# Patient Record
Sex: Male | Born: 1935 | Race: White | Hispanic: No | Smoking: Never smoker
Health system: Southern US, Community
[De-identification: ages and names within clinical notes are randomized; demographics above are authoritative.]

## PROBLEM LIST (undated history)

## (undated) DIAGNOSIS — I749 Embolism and thrombosis of unspecified artery: Secondary | ICD-10-CM

## (undated) DIAGNOSIS — E119 Type 2 diabetes mellitus without complications: Secondary | ICD-10-CM

## (undated) DIAGNOSIS — N289 Disorder of kidney and ureter, unspecified: Secondary | ICD-10-CM

## (undated) DIAGNOSIS — G4730 Sleep apnea, unspecified: Secondary | ICD-10-CM

## (undated) DIAGNOSIS — I509 Heart failure, unspecified: Secondary | ICD-10-CM

## (undated) DIAGNOSIS — M4800 Spinal stenosis, site unspecified: Secondary | ICD-10-CM

## (undated) DIAGNOSIS — I10 Essential (primary) hypertension: Secondary | ICD-10-CM

## (undated) DIAGNOSIS — I639 Cerebral infarction, unspecified: Secondary | ICD-10-CM

## (undated) DIAGNOSIS — I82409 Acute embolism and thrombosis of unspecified deep veins of unspecified lower extremity: Secondary | ICD-10-CM

## (undated) DIAGNOSIS — I2510 Atherosclerotic heart disease of native coronary artery without angina pectoris: Secondary | ICD-10-CM

## (undated) DIAGNOSIS — M21379 Foot drop, unspecified foot: Secondary | ICD-10-CM

## (undated) HISTORY — PX: TOTAL HIP ARTHROPLASTY: SHX124

---

## 2004-08-12 ENCOUNTER — Ambulatory Visit: Payer: Self-pay

## 2006-07-12 IMAGING — CT CT NECK WITH CONTRAST
1 of 2 series · 10 of 14 positions shown, 13 images · non-contrast
Comparison: none

REASON FOR EXAM: Rt Neck Mass Pt Diabetic Metformin
COMMENTS:

[Series 2: neck 3.0 b40f · axial · 0.49mm/px · z∈[-225,+36]mm · 10 of 107 slices shown, 13 images]
[im 10/107  soft-tissue]
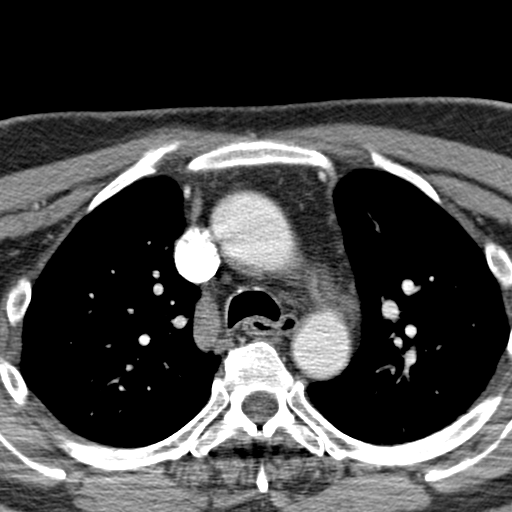
[im 10/107  bone]
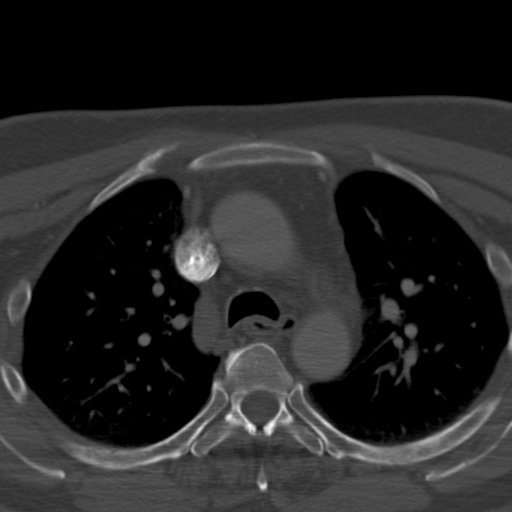
[im 20/107  bone]
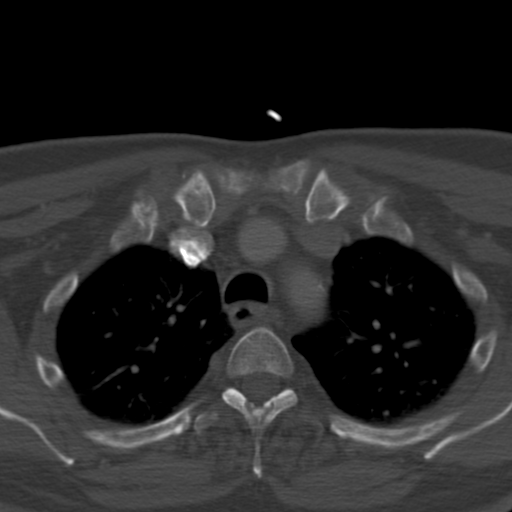
[im 29/107  bone]
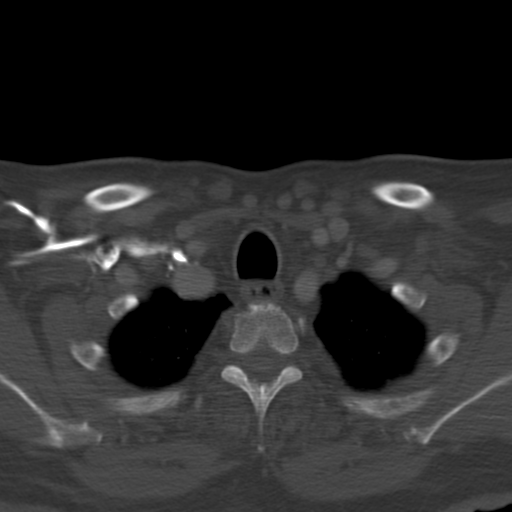
[im 39/107  bone]
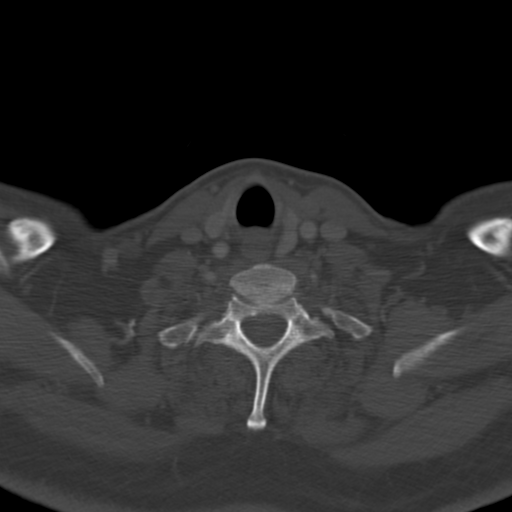
[im 49/107  soft-tissue]
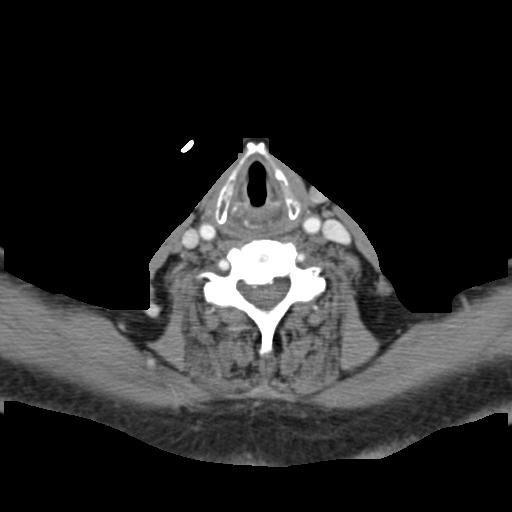
[im 49/107  bone]
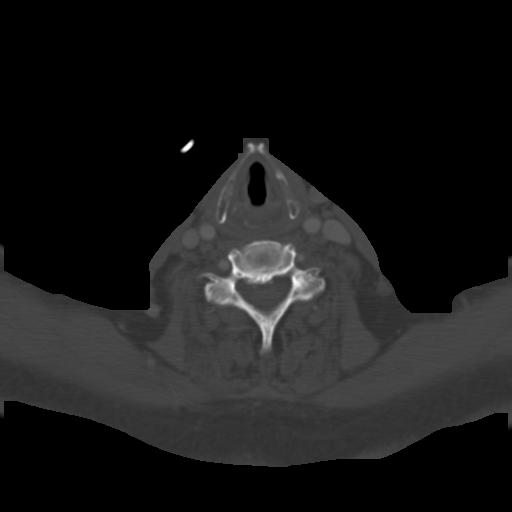
[im 58/107  bone]
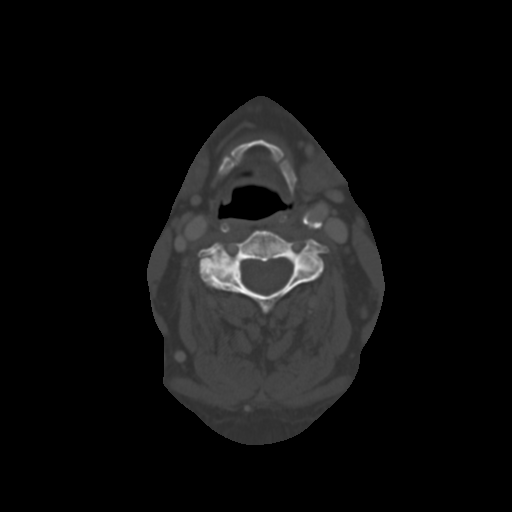
[im 68/107  bone]
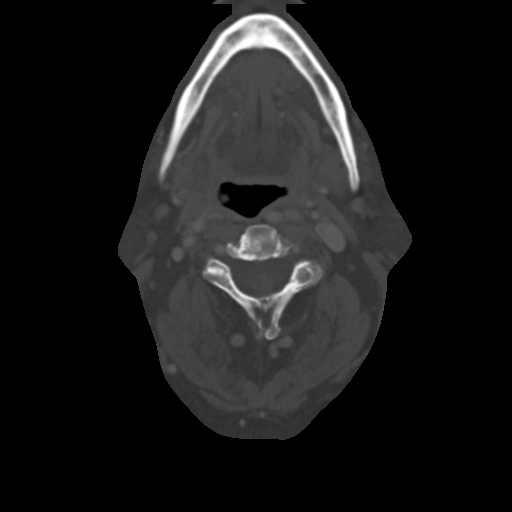
[im 78/107  bone]
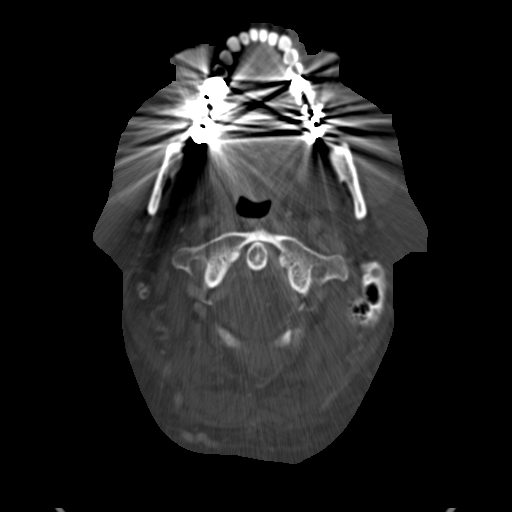
[im 87/107  soft-tissue]
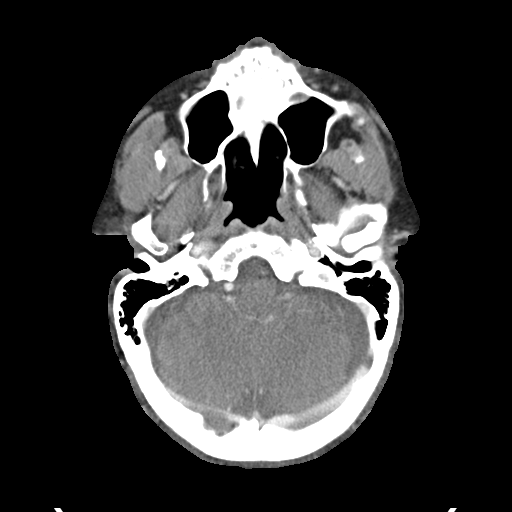
[im 87/107  bone]
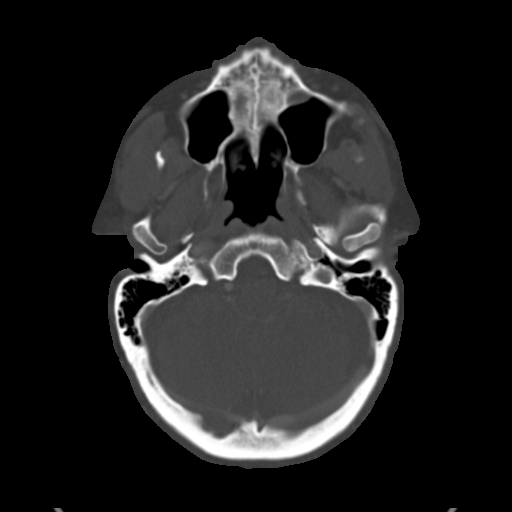
[im 97/107  bone]
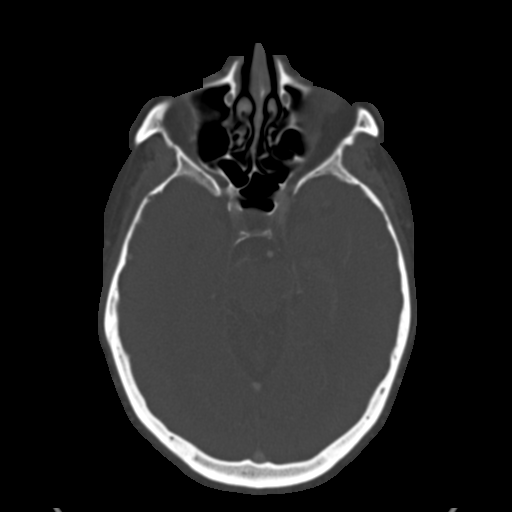

[10 of 14 positions shown; findings below may reference images not displayed]

PROCEDURE:     CT  - CT NECK WITH CONTRAST  - August 12, 2004 [DATE]

RESULT:         Axial images were obtained from the base of the skull to the
breast inlet.

No masses are identified.  The parotid glands as well as the submandibular
glands appear intact.  No enlarged lymph nodes are seen.  The LEFT carotid
is incidentally noted to be tortuous and extends just posterior to the
oropharynx on the LEFT.
IMPRESSION: No mass is identified in the RIGHT neck.

## 2009-04-08 ENCOUNTER — Inpatient Hospital Stay: Payer: Self-pay | Admitting: Specialist

## 2011-03-09 IMAGING — US US EXTREM LOW VENOUS BILAT
1 series · 17 of 24 positions shown · non-contrast
Comparison: none

REASON FOR EXAM: swelling/edema, ?DVT
COMMENTS:

[Series 1: us extrem low venous bilat · 17 of 53 slices shown]
[im 1/53]
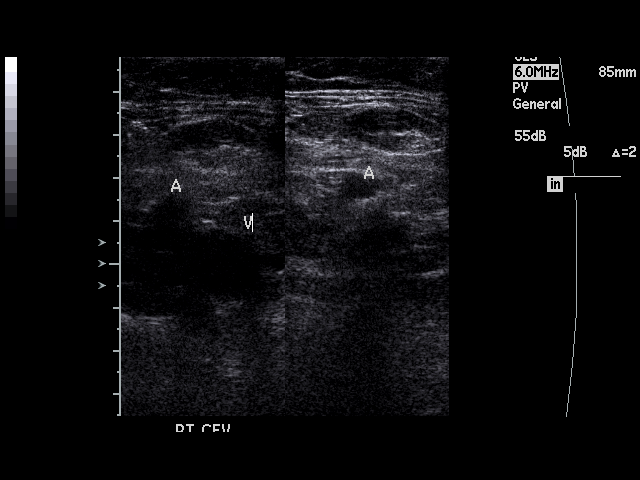
[im 5/53]
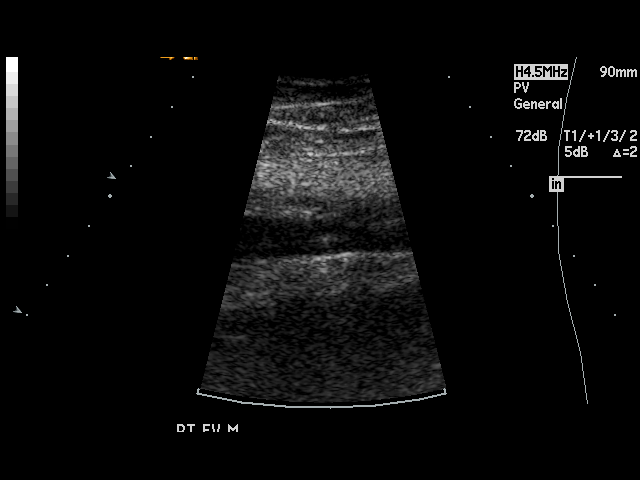
[im 7/53]
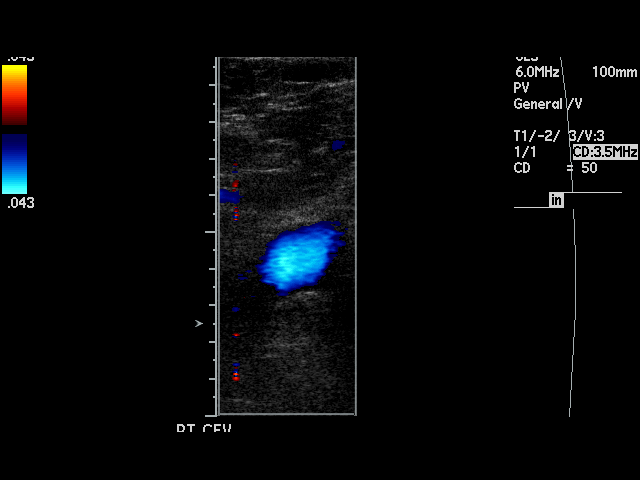
[im 10/53]
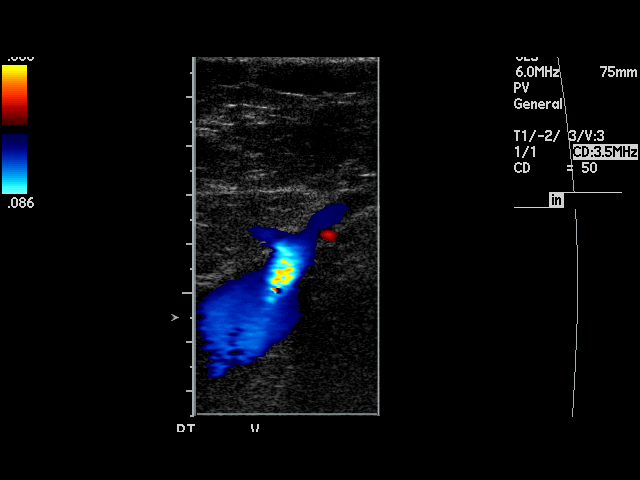
[im 14/53]
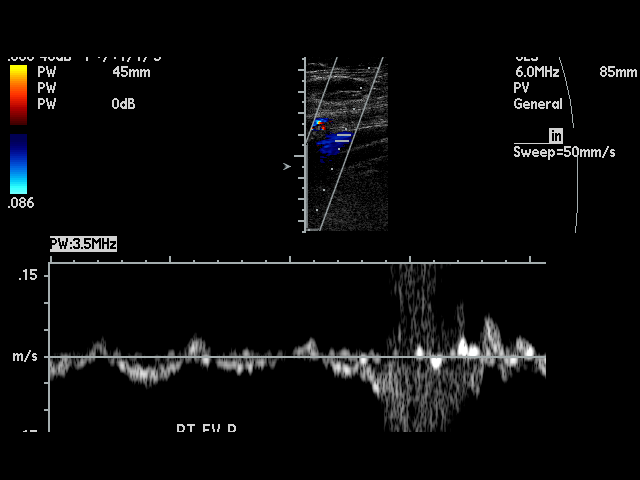
[im 16/53]
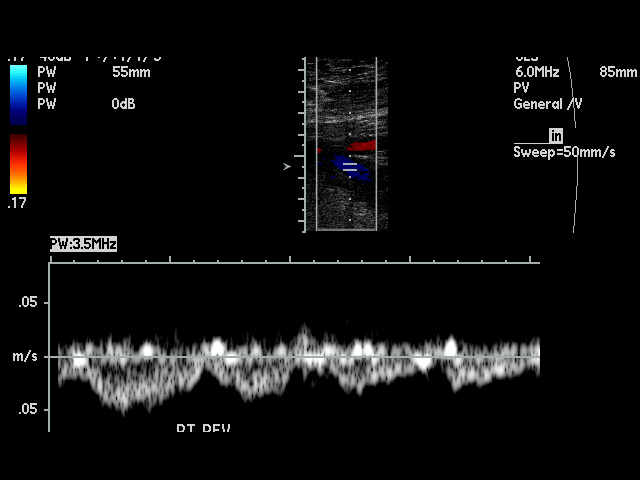
[im 21/53]
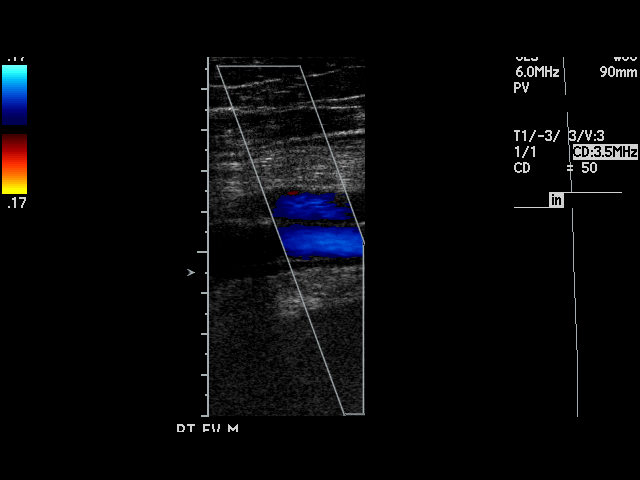
[im 23/53]
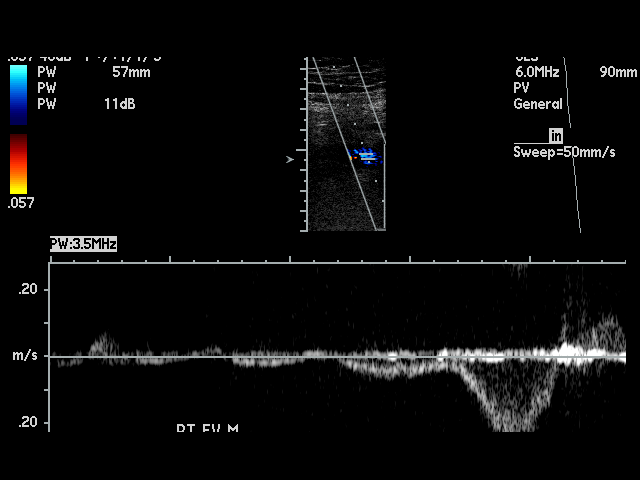
[im 28/53]
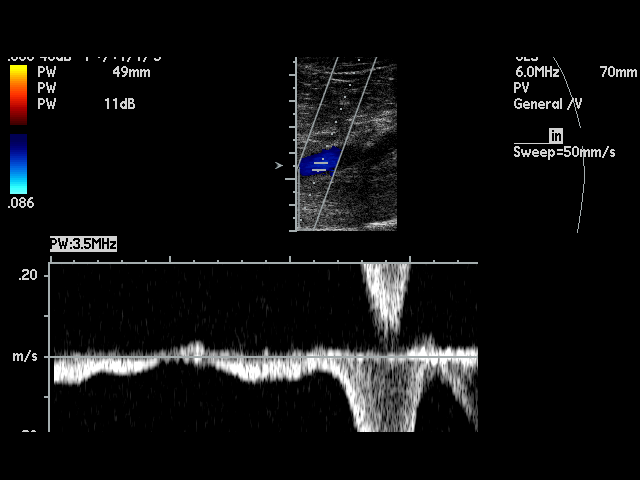
[im 30/53]
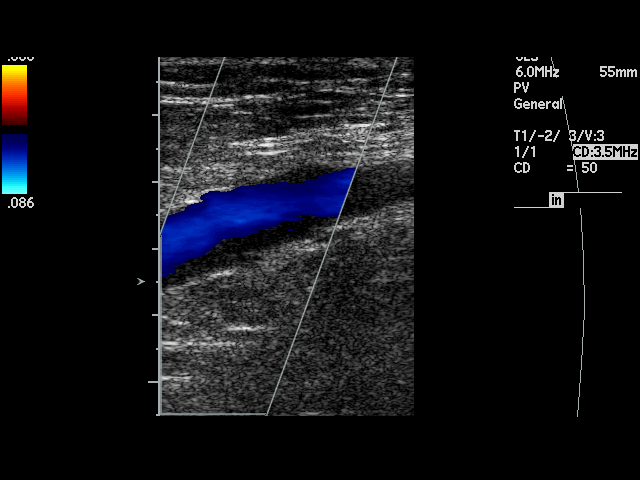
[im 32/53]
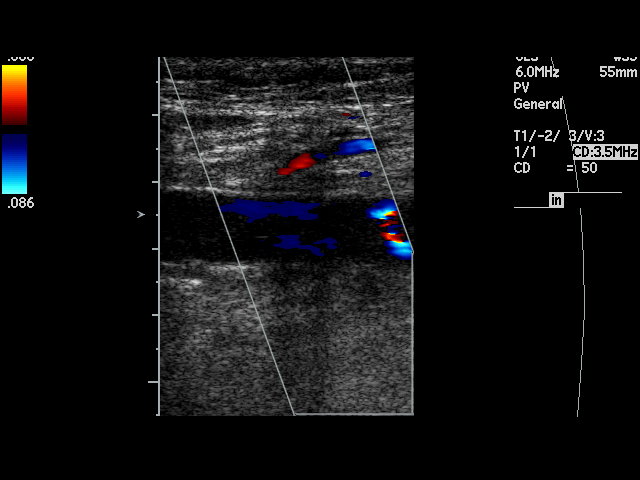
[im 37/53]
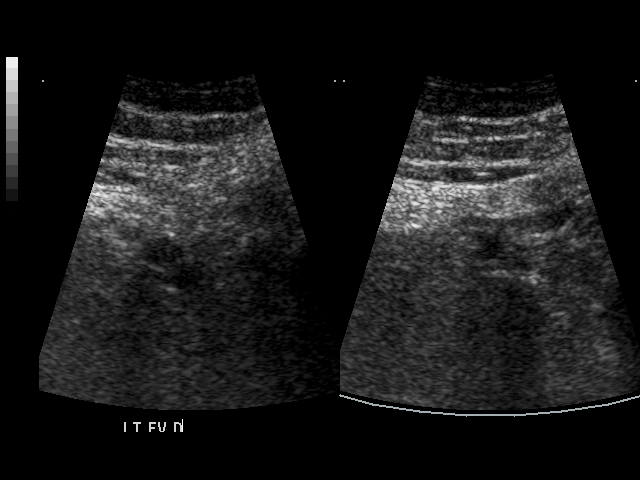
[im 39/53]
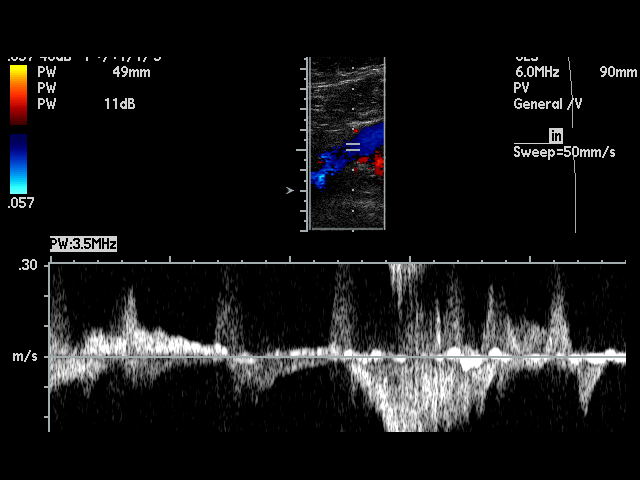
[im 43/53]
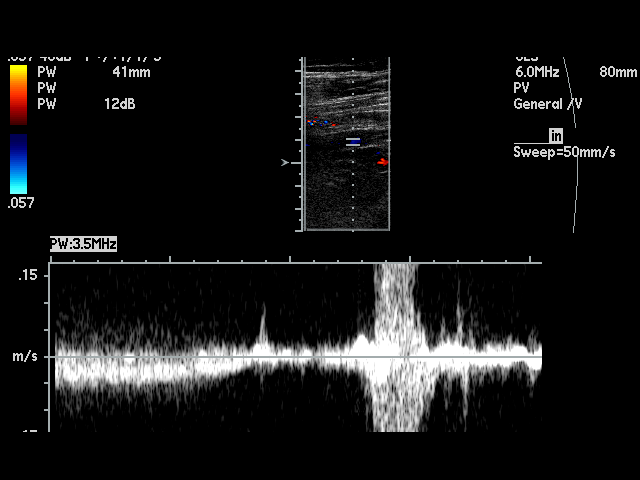
[im 46/53]
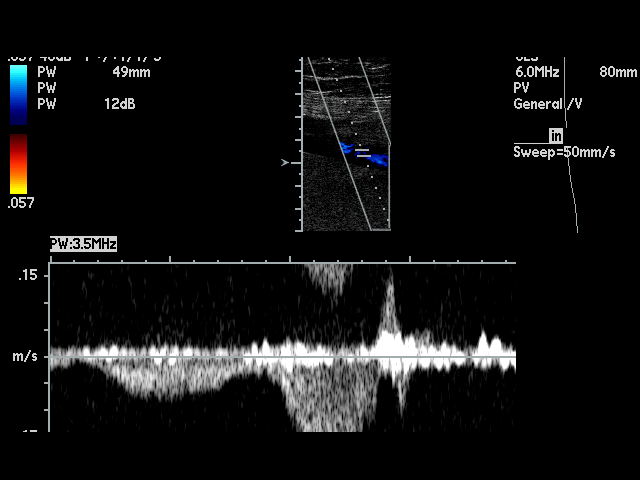
[im 48/53]
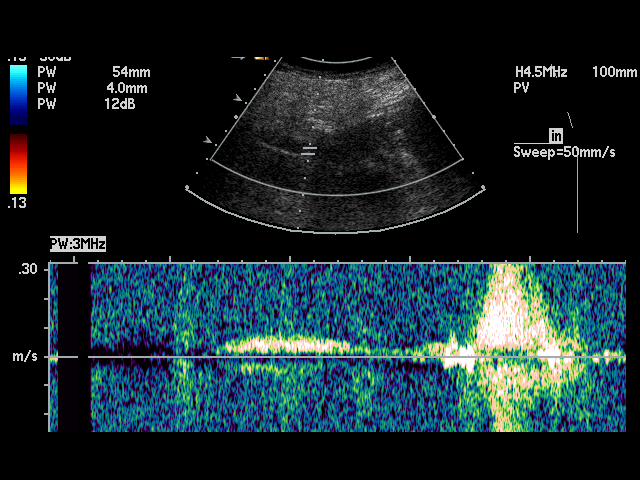
[im 53/53]
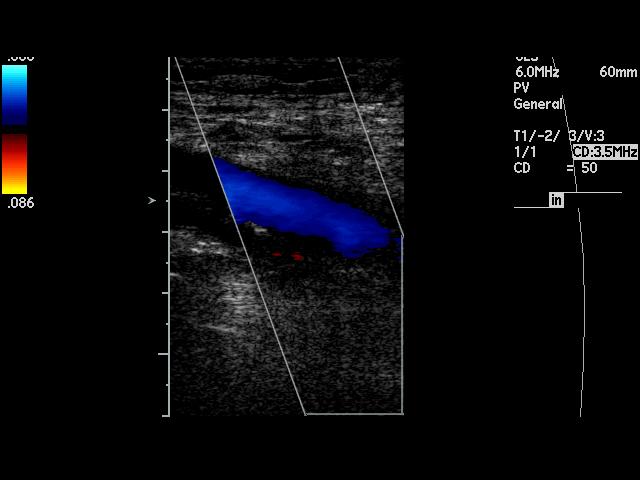

[17 of 24 positions shown; findings below may reference images not displayed]

PROCEDURE:     US  - US DOPPLER LOW EXTR BILATERAL  - April 09, 2009  [DATE]

RESULT:     On the right, the common femoral vein shows complete
compressibility. In the superficial femoral and popliteal, there is noted
loss of complete compressibility. The findings are compatible with deep vein
thrombosis of the right superficial femoral and popliteal. Doppler
examination shows the thrombus to be non-occlusive.

On the left, there is complete compressibility of the femoral and popliteal
vein throughout its course. Doppler examination shows no occlusion or
evidence for deep vein thrombosis on the left.
IMPRESSION: 1.  There is observed non-occlusive deep vein thrombosis involving the
superficial femoral and popliteal on the right.
2.  No deep vein thrombosis is identified on the left.

## 2022-01-20 ENCOUNTER — Inpatient Hospital Stay: Payer: BC Managed Care – PPO

## 2022-01-20 ENCOUNTER — Inpatient Hospital Stay
Admission: EM | Admit: 2022-01-20 | Discharge: 2022-01-21 | DRG: 088 | Disposition: A | Discharge status: Home / Self Care | Payer: BC Managed Care – PPO | Attending: Hospitalist | Admitting: Hospitalist

## 2022-01-20 ENCOUNTER — Other Ambulatory Visit: Payer: Self-pay

## 2022-01-20 ENCOUNTER — Emergency Department: Payer: BC Managed Care – PPO

## 2022-01-20 DIAGNOSIS — Y92009 Unspecified place in unspecified non-institutional (private) residence as the place of occurrence of the external cause: Secondary | ICD-10-CM

## 2022-01-20 DIAGNOSIS — E875 Hyperkalemia: Secondary | ICD-10-CM | POA: Diagnosis present

## 2022-01-20 DIAGNOSIS — E669 Obesity, unspecified: Secondary | ICD-10-CM | POA: Diagnosis present

## 2022-01-20 DIAGNOSIS — Z6837 Body mass index (BMI) 37.0-37.9, adult: Secondary | ICD-10-CM

## 2022-01-20 DIAGNOSIS — W010XXA Fall on same level from slipping, tripping and stumbling without subsequent striking against object, initial encounter: Secondary | ICD-10-CM | POA: Diagnosis present

## 2022-01-20 DIAGNOSIS — E039 Hypothyroidism, unspecified: Secondary | ICD-10-CM | POA: Diagnosis present

## 2022-01-20 DIAGNOSIS — Y92003 Bedroom of unspecified non-institutional (private) residence as the place of occurrence of the external cause: Secondary | ICD-10-CM

## 2022-01-20 DIAGNOSIS — R4182 Altered mental status, unspecified: Secondary | ICD-10-CM | POA: Diagnosis present

## 2022-01-20 DIAGNOSIS — Z7984 Long term (current) use of oral hypoglycemic drugs: Secondary | ICD-10-CM

## 2022-01-20 DIAGNOSIS — S065XAA Traumatic subdural hemorrhage with loss of consciousness status unknown, initial encounter: Principal | ICD-10-CM | POA: Diagnosis present

## 2022-01-20 DIAGNOSIS — Z79890 Hormone replacement therapy (postmenopausal): Secondary | ICD-10-CM | POA: Diagnosis not present

## 2022-01-20 DIAGNOSIS — W19XXXA Unspecified fall, initial encounter: Secondary | ICD-10-CM

## 2022-01-20 DIAGNOSIS — N1832 Chronic kidney disease, stage 3b: Secondary | ICD-10-CM | POA: Diagnosis present

## 2022-01-20 DIAGNOSIS — I129 Hypertensive chronic kidney disease with stage 1 through stage 4 chronic kidney disease, or unspecified chronic kidney disease: Secondary | ICD-10-CM | POA: Diagnosis present

## 2022-01-20 DIAGNOSIS — Z7901 Long term (current) use of anticoagulants: Secondary | ICD-10-CM

## 2022-01-20 DIAGNOSIS — Z79899 Other long term (current) drug therapy: Secondary | ICD-10-CM | POA: Diagnosis not present

## 2022-01-20 DIAGNOSIS — S0990XA Unspecified injury of head, initial encounter: Secondary | ICD-10-CM

## 2022-01-20 DIAGNOSIS — E785 Hyperlipidemia, unspecified: Secondary | ICD-10-CM | POA: Diagnosis present

## 2022-01-20 DIAGNOSIS — S060X0A Concussion without loss of consciousness, initial encounter: Principal | ICD-10-CM | POA: Diagnosis present

## 2022-01-20 DIAGNOSIS — E1129 Type 2 diabetes mellitus with other diabetic kidney complication: Secondary | ICD-10-CM | POA: Diagnosis present

## 2022-01-20 DIAGNOSIS — I4891 Unspecified atrial fibrillation: Secondary | ICD-10-CM | POA: Diagnosis present

## 2022-01-20 DIAGNOSIS — G9341 Metabolic encephalopathy: Secondary | ICD-10-CM | POA: Diagnosis present

## 2022-01-20 DIAGNOSIS — E1122 Type 2 diabetes mellitus with diabetic chronic kidney disease: Secondary | ICD-10-CM | POA: Diagnosis present

## 2022-01-20 DIAGNOSIS — S0101XA Laceration without foreign body of scalp, initial encounter: Secondary | ICD-10-CM | POA: Diagnosis present

## 2022-01-20 DIAGNOSIS — I10 Essential (primary) hypertension: Secondary | ICD-10-CM | POA: Diagnosis present

## 2022-01-20 DIAGNOSIS — Z8052 Family history of malignant neoplasm of bladder: Secondary | ICD-10-CM

## 2022-01-20 DIAGNOSIS — I482 Chronic atrial fibrillation, unspecified: Secondary | ICD-10-CM | POA: Diagnosis not present

## 2022-01-20 DIAGNOSIS — Z86718 Personal history of other venous thrombosis and embolism: Secondary | ICD-10-CM | POA: Diagnosis not present

## 2022-01-20 DIAGNOSIS — R410 Disorientation, unspecified: Secondary | ICD-10-CM

## 2022-01-20 DIAGNOSIS — I82409 Acute embolism and thrombosis of unspecified deep veins of unspecified lower extremity: Secondary | ICD-10-CM | POA: Diagnosis present

## 2022-01-20 DIAGNOSIS — E119 Type 2 diabetes mellitus without complications: Secondary | ICD-10-CM

## 2022-01-20 DIAGNOSIS — S065X0A Traumatic subdural hemorrhage without loss of consciousness, initial encounter: Secondary | ICD-10-CM | POA: Diagnosis not present

## 2022-01-20 HISTORY — DX: Type 2 diabetes mellitus without complications: E11.9

## 2022-01-20 HISTORY — DX: Essential (primary) hypertension: I10

## 2022-01-20 HISTORY — DX: Embolism and thrombosis of unspecified artery: I74.9

## 2022-01-20 HISTORY — DX: Acute embolism and thrombosis of unspecified deep veins of unspecified lower extremity: I82.409

## 2022-01-20 LAB — COMPREHENSIVE METABOLIC PANEL
ALT: 15 U/L (ref 0–44)
AST: 18 U/L (ref 15–41)
Albumin: 3.7 g/dL (ref 3.5–5.0)
Alkaline Phosphatase: 36 U/L — ABNORMAL LOW (ref 38–126)
Anion gap: 7 (ref 5–15)
BUN: 43 mg/dL — ABNORMAL HIGH (ref 8–23)
CO2: 24 mmol/L (ref 22–32)
Calcium: 9.8 mg/dL (ref 8.9–10.3)
Chloride: 108 mmol/L (ref 98–111)
Creatinine, Ser: 1.63 mg/dL — ABNORMAL HIGH (ref 0.61–1.24)
GFR, Estimated: 41 mL/min — ABNORMAL LOW (ref >60)
Glucose, Bld: 137 mg/dL — ABNORMAL HIGH (ref 70–99)
Potassium: 5.3 mmol/L — ABNORMAL HIGH (ref 3.5–5.1)
Sodium: 139 mmol/L (ref 135–145)
Total Bilirubin: 0.6 mg/dL (ref 0.3–1.2)
Total Protein: 7.1 g/dL (ref 6.5–8.1)

## 2022-01-20 LAB — URINALYSIS, COMPLETE (UACMP) WITH MICROSCOPIC
Bacteria, UA: NONE SEEN
Bilirubin Urine: NEGATIVE
Glucose, UA: NEGATIVE mg/dL
Hgb urine dipstick: NEGATIVE
Ketones, ur: NEGATIVE mg/dL
Leukocytes,Ua: NEGATIVE
Nitrite: NEGATIVE
Protein, ur: NEGATIVE mg/dL
Specific Gravity, Urine: 1.013 (ref 1.005–1.030)
pH: 6 (ref 5.0–8.0)

## 2022-01-20 LAB — CBG MONITORING, ED: Glucose-Capillary: 114 mg/dL — ABNORMAL HIGH (ref 70–99)

## 2022-01-20 LAB — APTT: aPTT: 24 seconds (ref 24–36)

## 2022-01-20 LAB — PROTIME-INR
INR: 1.1 (ref 0.8–1.2)
Prothrombin Time: 14.4 seconds (ref 11.4–15.2)

## 2022-01-20 LAB — CBC WITH DIFFERENTIAL/PLATELET
Abs Immature Granulocytes: 0.02 10*3/uL (ref 0.00–0.07)
Basophils Absolute: 0 10*3/uL (ref 0.0–0.1)
Basophils Relative: 1 %
Eosinophils Absolute: 0.1 10*3/uL (ref 0.0–0.5)
Eosinophils Relative: 3 %
HCT: 42.6 % (ref 39.0–52.0)
Hemoglobin: 13.5 g/dL (ref 13.0–17.0)
Immature Granulocytes: 0 %
Lymphocytes Relative: 12 %
Lymphs Abs: 0.7 10*3/uL (ref 0.7–4.0)
MCH: 29.9 pg (ref 26.0–34.0)
MCHC: 31.7 g/dL (ref 30.0–36.0)
MCV: 94.5 fL (ref 80.0–100.0)
Monocytes Absolute: 0.5 10*3/uL (ref 0.1–1.0)
Monocytes Relative: 10 %
Neutro Abs: 4.2 10*3/uL (ref 1.7–7.7)
Neutrophils Relative %: 74 %
Platelets: 138 10*3/uL — ABNORMAL LOW (ref 150–400)
RBC: 4.51 MIL/uL (ref 4.22–5.81)
RDW: 13.5 % (ref 11.5–15.5)
WBC: 5.6 10*3/uL (ref 4.0–10.5)
nRBC: 0 % (ref 0.0–0.2)

## 2022-01-20 MED ORDER — AMLODIPINE BESYLATE 5 MG PO TABS
5.0000 mg | ORAL_TABLET | Freq: Every day | ORAL | Status: DC
  Administered 2022-01-20 – 2022-01-21 (×2): 5 mg via ORAL
  Filled 2022-01-20 (×2): qty 1

## 2022-01-20 MED ORDER — ROSUVASTATIN CALCIUM 20 MG PO TABS
10.0000 mg | ORAL_TABLET | Freq: Every day | ORAL | Status: DC
  Administered 2022-01-20 – 2022-01-21 (×2): 10 mg via ORAL
  Filled 2022-01-20 (×2): qty 1

## 2022-01-20 MED ORDER — INSULIN ASPART 100 UNIT/ML IJ SOLN: 0.0000 [IU] | Freq: Three times a day (TID) | INTRAMUSCULAR | Status: DC

## 2022-01-20 MED ORDER — HYDRALAZINE HCL 20 MG/ML IJ SOLN: 5.0000 mg | INTRAMUSCULAR | Status: DC | PRN

## 2022-01-20 MED ORDER — SODIUM ZIRCONIUM CYCLOSILICATE 10 G PO PACK
10.0000 g | PACK | Freq: Once | ORAL | Status: DC
  Filled 2022-01-20: qty 1

## 2022-01-20 MED ORDER — ONDANSETRON HCL 4 MG/2ML IJ SOLN: 4.0000 mg | Freq: Three times a day (TID) | INTRAMUSCULAR | Status: DC | PRN

## 2022-01-20 MED ORDER — LEVOTHYROXINE SODIUM 25 MCG PO TABS
25.0000 ug | ORAL_TABLET | Freq: Every morning | ORAL | Status: DC
  Administered 2022-01-21: 25 ug via ORAL
  Filled 2022-01-20: qty 1

## 2022-01-20 MED ORDER — FENOFIBRATE 54 MG PO TABS
54.0000 mg | ORAL_TABLET | Freq: Every day | ORAL | Status: DC
  Administered 2022-01-20 – 2022-01-21 (×2): 54 mg via ORAL
  Filled 2022-01-20 (×3): qty 1

## 2022-01-20 MED ORDER — EZETIMIBE 10 MG PO TABS
10.0000 mg | ORAL_TABLET | Freq: Every day | ORAL | Status: DC
  Administered 2022-01-20 – 2022-01-21 (×2): 10 mg via ORAL
  Filled 2022-01-20 (×2): qty 1

## 2022-01-20 MED ORDER — INSULIN ASPART 100 UNIT/ML IJ SOLN: 0.0000 [IU] | Freq: Every day | INTRAMUSCULAR | Status: DC

## 2022-01-20 MED ORDER — ACETAMINOPHEN 325 MG PO TABS: 650.0000 mg | ORAL_TABLET | Freq: Four times a day (QID) | ORAL | Status: DC | PRN

## 2022-01-20 MED ORDER — CARVEDILOL 6.25 MG PO TABS
12.5000 mg | ORAL_TABLET | Freq: Two times a day (BID) | ORAL | Status: DC
  Administered 2022-01-20 – 2022-01-21 (×3): 12.5 mg via ORAL
  Filled 2022-01-20 (×3): qty 2

## 2022-01-20 MED ORDER — SODIUM CHLORIDE 0.9 % IV BOLUS
1000.0000 mL | Freq: Once | INTRAVENOUS | Status: AC
  Administered 2022-01-20: 1000 mL via INTRAVENOUS

## 2022-01-20 MED ORDER — OMEGA-3-ACID ETHYL ESTERS 1 G PO CAPS
2.0000 | ORAL_CAPSULE | Freq: Two times a day (BID) | ORAL | Status: DC
  Administered 2022-01-20 – 2022-01-21 (×3): 2 g via ORAL
  Filled 2022-01-20 (×3): qty 2

## 2022-01-20 NOTE — H&P (Addendum)
History and Physical    Troy Mueller WUJ:[id number] DOB: 12/21/1932 DOA: 01/20/2022  Referring MD/NP/PA:   PCP: Kandyce RudBabaoff, Marcus, MD   Patient coming from:  The patient is coming from home.     Chief Complaint: fall and head injury, AMS  HPI: Troy Mueller is a 88 y.o. male with medical history significant of hypertension, hyperlipidemia, diabetes, hypothyroidism, DVT and atrial fibrillation on Eliquis, CKD-3B, who presents with fall, head injury and altered mental status.  Per his caregiver at bedside, pt slipped on area rugs that are next to his bed and had fall, hitting his head. No LOC. Pt is normally alert, but he became disoriented after fall.  I saw patient in ED, patient is mildly confused, but still oriented x3.  He moves all extremities.  No facial droop or slurred speech.  He had a laceration in the occipital area, which was stitched up and bleeding is controled. He denies chest pain, cough, shortness breath.  No nausea, vomiting, diarrhea or abdominal pain.  No symptoms of UTI.  Data reviewed independently and ED Course: pt was found to have WBC 5.6, stable renal function, potassium 5.3, temperature normal, blood pressure 186/65, heart rate 57, RR 20, oxygen saturation 98% on room air. Pt is admitted to SDU as inpt. Dr. Marcell BarlowYarborough of neurosurgery is consulted.  CT-head: 1. Suspected low-density subdural collections bilaterally, difficult to measure but estimated to be approximately 1 cm bilaterally. A small focus of hyperdensity along the high left parietal convexity (series 2, image 25; series 5, image 35) is concerning for acute/recent extra-axial hemorrhage. No significant midline shift. 2. No evidence of acute fracture or traumatic malalignment in the cervical spine. 3. Multilevel facet uncovertebral hypertrophy with varying degrees of neural foraminal stenosis, probably at least moderate on the right at C5-C6. MRI could further characterize if  clinically warranted.   EKG: I have personally reviewed.  Atrial fibrillation, QTc 413, LAD, poor R wave progression   Review of Systems:   General: no fevers, chills, no body weight gain, has fatigue HEENT: no blurry vision, hearing changes or sore throat Respiratory: no dyspnea, coughing, wheezing CV: no chest pain, no palpitations GI: no nausea, vomiting, abdominal pain, diarrhea, constipation GU: no dysuria, burning on urination, increased urinary frequency, hematuria  Ext: no leg edema Neuro: no unilateral weakness, numbness, or tingling, no vision change or hearing loss. Has fall and AMS Skin: no rash, has scalp laceration MSK: No muscle spasm, no deformity, no limitation of range of movement in spin Heme: No easy bruising.  Travel history: No recent long distant travel.   Allergy:  Allergies  Allergen Reactions   Tylenol [Acetaminophen]     rash    Past Medical History:  Diagnosis Date   Diabetes mellitus without complication (HCC)    DVT (deep venous thrombosis) (HCC)    Embolus (HCC)    HTN (hypertension)     Past Surgical History:  Procedure Laterality Date   TOTAL HIP ARTHROPLASTY      Social History:  reports that he has never smoked. He has never used smokeless tobacco. He reports that he does not currently use alcohol. He reports that he does not use drugs.  Family History:  Family History  Problem Relation Age of Onset   Bladder Cancer Brother      Prior to Admission medications   Medication Sig Start Date End Date Taking? Authorizing Provider  carvedilol (COREG) 12.5 MG tablet Take 12.5 mg by mouth 2 (two)  times daily. 10/15/21  Yes [provider]  JUBLIA 10 % SOLN Apply topically daily. 08/11/21  Yes [provider]  omega-3 acid ethyl esters (LOVAZA) 1 g capsule Take 2 capsules by mouth 2 (two) times daily. 12/30/21  Yes [provider]  PREVIDENT 5000 PLUS 1.1 % CREA dental cream Place 1 Application onto teeth at  bedtime. 01/12/22  Yes [provider]  ramipril (ALTACE) 5 MG capsule Take by mouth. 10/15/21  Yes [provider]  rosuvastatin (CRESTOR) 10 MG tablet Take 1 tablet by mouth daily. 12/09/21  Yes [provider]  amLODipine (NORVASC) 5 MG tablet Take 5 mg by mouth daily.    [provider]  ELIQUIS 5 MG TABS tablet Take 5 mg by mouth 2 (two) times daily.    [provider]  ezetimibe (ZETIA) 10 MG tablet Take 10 mg by mouth daily.    [provider]  fenofibrate 54 MG tablet Take 54 mg by mouth daily.    [provider]  glipiZIDE (GLUCOTROL XL) 5 MG 24 hr tablet Take 5 mg by mouth daily.    [provider]  levothyroxine (SYNTHROID) 25 MCG tablet Take 25 mcg by mouth every morning.    [provider]  metFORMIN (GLUCOPHAGE-XR) 500 MG 24 hr tablet SMARTSIG:2 Tablet(s) By Mouth Every Evening    [provider]    Physical Exam: Vitals:   01/20/22 1130 01/20/22 1215 01/20/22 1539 01/20/22 1548  BP: (!) 143/69 (!) 153/76 137/80   Pulse: 67 (!) 58 62   Resp: 17 20    Temp:      TempSrc:      SpO2: 98% 97%    Weight:    122.5 kg  Height:    5\' 11"  (1.803 m)   General: Not in acute distress HEENT:       Eyes: PERRL, EOMI, no scleral icterus.       ENT: No discharge from the ears and nose, no pharynx injection, no tonsillar enlargement.        Neck: No JVD, no bruit, no mass felt. Heme: No neck lymph node enlargement. Cardiac: S1/S2, RRR, No murmurs, No gallops or rubs. Respiratory: No rales, wheezing, rhonchi or rubs. GI: Soft, nondistended, nontender, no rebound pain, no organomegaly, BS present. GU: No hematuria Ext: No pitting leg edema bilaterally. 1+DP/PT pulse bilaterally. Musculoskeletal: No joint deformities, No joint redness or warmth, no limitation of ROM in spin. Skin: No rashes.  Has scalp laceration to occipital area Neuro: Mildly confused, still oriented X3, cranial nerves II-XII  grossly intact, moves all extremities normally.  Psych: Patient is not psychotic, no suicidal or hemocidal ideation.  Labs on Admission: I have personally reviewed following labs and imaging studies  CBC: Recent Labs  Lab 01/20/22 1010  WBC 5.6  NEUTROABS 4.2  HGB 13.5  HCT 42.6  MCV 94.5  PLT 138*   Basic Metabolic Panel: Recent Labs  Lab 01/20/22 1010  NA 139  K 5.3*  CL 108  CO2 24  GLUCOSE 137*  BUN 43*  CREATININE 1.63*  CALCIUM 9.8   GFR: Estimated Creatinine Clearance: 42.5 mL/min (A) (by C-G formula based on SCr of 1.63 mg/dL (H)). Liver Function Tests: Recent Labs  Lab 01/20/22 1010  AST 18  ALT 15  ALKPHOS 36*  BILITOT 0.6  PROT 7.1  ALBUMIN 3.7   No results for input(s): "LIPASE", "AMYLASE" in the last 168 hours. No results for input(s): "AMMONIA" in the last  168 hours. Coagulation Profile: Recent Labs  Lab 01/20/22 1010  INR 1.1   Cardiac Enzymes: No results for input(s): "CKTOTAL", "CKMB", "CKMBINDEX", "TROPONINI" in the last 168 hours. BNP (last 3 results) No results for input(s): "PROBNP" in the last 8760 hours. HbA1C: No results for input(s): "HGBA1C" in the last 72 hours. CBG: No results for input(s): "GLUCAP" in the last 168 hours. Lipid Profile: No results for input(s): "CHOL", "HDL", "LDLCALC", "TRIG", "CHOLHDL", "LDLDIRECT" in the last 72 hours. Thyroid Function Tests: No results for input(s): "TSH", "T4TOTAL", "FREET4", "T3FREE", "THYROIDAB" in the last 72 hours. Anemia Panel: No results for input(s): "VITAMINB12", "FOLATE", "FERRITIN", "TIBC", "IRON", "RETICCTPCT" in the last 72 hours. Urine analysis: No results found for: "COLORURINE", "APPEARANCEUR", "LABSPEC", "PHURINE", "GLUCOSEU", "HGBUR", "BILIRUBINUR", "KETONESUR", "PROTEINUR", "UROBILINOGEN", "NITRITE", "LEUKOCYTESUR" Sepsis Labs: @LABRCNTIP (procalcitonin:4,lacticidven:4) )No results found for this or any previous visit (from the past 240 hour(s)).   Radiological  Exams on Admission: CT Cervical Spine Wo Contrast  Result Date: 01/20/2022 CLINICAL DATA:  Head trauma, minor (Age >= 65y); Neck trauma (Age >= 65y) EXAM: CT HEAD WITHOUT CONTRAST CT CERVICAL SPINE WITHOUT CONTRAST TECHNIQUE: Multidetector CT imaging of the head and cervical spine was performed following the standard protocol without intravenous contrast. Multiplanar CT image reconstructions of the cervical spine were also generated. RADIATION DOSE REDUCTION: This exam was performed according to the departmental dose-optimization program which includes automated exposure control, adjustment of the mA and/or kV according to patient size and/or use of iterative reconstruction technique. COMPARISON:  None Available. FINDINGS: CT HEAD FINDINGS Brain: Suspected low-density subdural collections bilaterally, difficult to measure but estimated to be approximately 1 cm bilaterally. A small focus of hyperdensity along the high left parietal convexity (series 2, image 25; series 5, image 35) is concerning for acute/recent extra-axial hemorrhage. No significant mass effect. Vascular: Calcific atherosclerosis. Skull: No acute fracture. Sinuses/Orbits: Clear sinuses.  No acute orbital findings. Other: No mastoid effusions. CT CERVICAL SPINE FINDINGS Alignment: No substantial sagittal subluxation. Skull base and vertebrae: No evidence of acute fracture. Soft tissues and spinal canal: No prevertebral fluid or swelling. No visible canal hematoma. Disc levels: Multilevel facet uncovertebral hypertrophy with varying degrees of neural foraminal stenosis, probably at least moderate on the right at C5-C6. Upper chest: Visualized lung apices are clear. IMPRESSION: 1. Suspected low-density subdural collections bilaterally, difficult to measure but estimated to be approximately 1 cm bilaterally. A small focus of hyperdensity along the high left parietal convexity (series 2, image 25; series 5, image 35) is concerning for acute/recent  extra-axial hemorrhage. No significant midline shift. 2. No evidence of acute fracture or traumatic malalignment in the cervical spine. 3. Multilevel facet uncovertebral hypertrophy with varying degrees of neural foraminal stenosis, probably at least moderate on the right at C5-C6. MRI could further characterize if clinically warranted. Electronically Signed   By: 13/10/2021 M.D.   On: 01/20/2022 09:04   CT Head Wo Contrast  Result Date: 01/20/2022 CLINICAL DATA:  Head trauma, minor (Age >= 65y); Neck trauma (Age >= 65y) EXAM: CT HEAD WITHOUT CONTRAST CT CERVICAL SPINE WITHOUT CONTRAST TECHNIQUE: Multidetector CT imaging of the head and cervical spine was performed following the standard protocol without intravenous contrast. Multiplanar CT image reconstructions of the cervical spine were also generated. RADIATION DOSE REDUCTION: This exam was performed according to the departmental dose-optimization program which includes automated exposure control, adjustment of the mA and/or kV according to patient size and/or use of iterative reconstruction technique. COMPARISON:  None Available. FINDINGS: CT HEAD FINDINGS Brain:  Suspected low-density subdural collections bilaterally, difficult to measure but estimated to be approximately 1 cm bilaterally. A small focus of hyperdensity along the high left parietal convexity (series 2, image 25; series 5, image 35) is concerning for acute/recent extra-axial hemorrhage. No significant mass effect. Vascular: Calcific atherosclerosis. Skull: No acute fracture. Sinuses/Orbits: Clear sinuses.  No acute orbital findings. Other: No mastoid effusions. CT CERVICAL SPINE FINDINGS Alignment: No substantial sagittal subluxation. Skull base and vertebrae: No evidence of acute fracture. Soft tissues and spinal canal: No prevertebral fluid or swelling. No visible canal hematoma. Disc levels: Multilevel facet uncovertebral hypertrophy with varying degrees of neural foraminal stenosis,  probably at least moderate on the right at C5-C6. Upper chest: Visualized lung apices are clear. IMPRESSION: 1. Suspected low-density subdural collections bilaterally, difficult to measure but estimated to be approximately 1 cm bilaterally. A small focus of hyperdensity along the high left parietal convexity (series 2, image 25; series 5, image 35) is concerning for acute/recent extra-axial hemorrhage. No significant midline shift. 2. No evidence of acute fracture or traumatic malalignment in the cervical spine. 3. Multilevel facet uncovertebral hypertrophy with varying degrees of neural foraminal stenosis, probably at least moderate on the right at C5-C6. MRI could further characterize if clinically warranted. Electronically Signed   By: Feliberto Harts M.D.   On: 01/20/2022 09:04      Assessment/Plan Principal Problem:   SDH (subdural hematoma) (HCC) Active Problems:   Fall at home, initial encounter   Hyperkalemia   Acute metabolic encephalopathy   DVT (deep venous thrombosis) (HCC)   Atrial fibrillation, chronic (HCC)   HTN (hypertension)   Chronic kidney disease, stage 3b (HCC)   Type II diabetes mellitus with renal manifestations (HCC)   HLD (hyperlipidemia)   Obesity (BMI 30-39.9)   Assessment and Plan:  SDH (subdural hematoma) (HCC): EDP consulted Dr. Marcell Barlow of neurosurgery.  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  he recommended holding Eliquis, did not recommend reversing Eliquis at this time. Does not feel that treating/draining the subdurals would have improvement given his significant atrophy.  -will admit to SDU as inpt --> change to tele -Frequent neurochecks -Repeat CT scan at 430  Addendum: the repeated CT-head showed unchanged appearance of bilateral predominantly low-density subdural fluid collections. The periphery of these fluid collections appears slightly hyperdense with unchanged appearance of  the focal hyperdensity seen along the high left parietal convexity.  Fall at home, initial encounter -Fall precaution -PT/OT tomorrow (not ordered yet)  Hyperkalemia: Potassium of 5.3 -10 g Lokelma  Acute metabolic encephalopathy: May be due to concussion -Frequent neurochecks -Follow-up urinalysis  DVT (deep venous thrombosis) (HCC) -Hold Eliquis  Atrial fibrillation, chronic (HCC): Heart rate 57 -Hold Eliquis -Coreg  HTN (hypertension) -IV hydralazine as needed -Continue amlodipine, Coreg  Chronic kidney disease, stage 3b (HCC): Renal function stable.  Recent baseline creatinine 1.8 on 07/21/2021.  His creatinine is 1.62, BUN 43, GFR 41 -Follow-up BMP  Type II diabetes mellitus with renal manifestations [facility]): Recent A1c 6.6, well controlled.  Patient still taking metformin and glipizide at home -SSI  HLD; -Zetia, fenofibrate, Crestor  Obesity: with BMI= 37.66   and BW= 122.5 kg -Diet and exercise.   -Encourage to lose weight.      DVT ppx: SCD  Code Status: Full code  Family Communication:   Yes, patient's daughter  by phone  Disposition Plan:  Anticipate discharge back to previous environment  Consults called:  Dr. Marcell Barlow for neurosurgery  Admission status and Level of care: Stepdown:    as inpt        Dispo: The patient is from: Home              Anticipated d/c is to: Home              Anticipated d/c date is: 2 days              Patient currently is not medically stable to d/c.    Severity of Illness:  The appropriate patient status for this patient is INPATIENT. Inpatient status is judged to be reasonable and necessary in order to provide the required intensity of service to ensure the patient's safety. The patient's presenting symptoms, physical exam findings, and initial radiographic and laboratory data in the context of their chronic comorbidities is felt to place them at high risk for further clinical deterioration. Furthermore, it is not  anticipated that the patient will be medically stable for discharge from the hospital within 2 midnights of admission.   * I certify that at the point of admission it is my clinical judgment that the patient will require inpatient hospital care spanning beyond 2 midnights from the point of admission due to high intensity of service, high risk for further deterioration and high frequency of surveillance required.*       Date of Service 01/20/2022    Lorretta Harp [facility]   If 7PM-7AM, please contact night-coverage [email or web address] 01/20/2022, 4:35 PM

## 2022-01-20 NOTE — ED Notes (Signed)
Patient transported to CT 

## 2022-01-20 NOTE — ED Triage Notes (Addendum)
Pt presents to ED via [facility] from home with c/o of having a all after tripping over a area rug. Pt is alert but disoriented, pt disoriented to place, month, or  current year. Pt oriented to name and birthday. EMS states family at scene states this is stuff he would normally know.   Pt has lac to back of head, bleeding controlled.

## 2022-01-20 NOTE — ED Provider Notes (Addendum)
[facility] Provider Note    Event Date/Time   First MD Initiated Contact with Patient 01/20/22 0820     (approximate)   History   Fall and Altered Mental Status   HPI  Troy Mueller is a 88 y.o. male with past medical history significant for atrial fibrillation on Eliquis who presents to the emergency department following a nonsyncopal fall.  Patient slipped on area rugs that are next to his bed and had a fall hitting his head.  Patient is normally alert and oriented" sharp as tack".  Confusion and disoriented since the fall.  Denies any preceding chest pain or shortness of breath.  Denies any chest pain or back pain.     Physical Exam   Triage Vital Signs: ED Triage Vitals  Enc Vitals Group     BP 01/20/22 0826 (!) 186/65     Pulse Rate 01/20/22 0826 (!) 57     Resp 01/20/22 0826 20     Temp 01/20/22 0826 98.6 F (37 C)     Temp Source 01/20/22 0826 Oral     SpO2 01/20/22 0826 98 %     Weight --      Height --      Head Circumference --      Peak Flow --      Pain Score 01/20/22 0827 0     Pain Loc --      Pain Edu? --      Excl. in GC? --     Most recent vital signs: Vitals:   01/20/22 0826  BP: (!) 186/65  Pulse: (!) 57  Resp: 20  Temp: 98.6 F (37 C)  SpO2: 98%    Physical Exam HENT:     Head:     Comments: Hematoma to the occipital scalp.  Hemostatic.    Right Ear: External ear normal.     Left Ear: External ear normal.  Eyes:     Extraocular Movements: Extraocular movements intact.     Pupils: Pupils are equal, round, and reactive to light.  Neck:     Comments: Cervical collar in place Cardiovascular:     Rate and Rhythm: Normal rate.  Pulmonary:     Effort: Pulmonary effort is normal.  Abdominal:     General: There is no distension.  Musculoskeletal:        General: Normal range of motion.     Cervical back: No tenderness.  Skin:    General: Skin is warm.  Neurological:     Mental Status: He is alert.  He is disoriented.     Comments: Cranial nerves intact.  5/5 strength bilateral upper and lower extremities.  Alert and oriented to person and place.  Altered from his baseline according to friends at bedside.          IMPRESSION / MDM / ASSESSMENT AND PLAN / ED COURSE  I reviewed the triage vital signs and the nursing notes.  88 year old male with past medical history significant for atrial fibrillation on Eliquis who presents to the emergency department following a nonsyncopal fall with significant head trauma.  On arrival patient is altered with obvious significant head trauma.  Differential diagnosis including intracranial hemorrhage, skull fracture, cervical strain, concussion, cervical fracture  Called back to CT and patient was immediately taken back to CT scan of his head.   EKG  My interpretation of the EKG -atrial fibrillation with heart rate 51.  No significant ST elevation or  depression.  No signs of acute ischemia or dysrhythmia.  No findings of hyperkalemia  Atrial fibrillation and bradycardia while on cardiac telemetry.   RADIOLOGY I independently reviewed imaging, my interpretation of imaging: CT head and cervical spine.  CT on my evaluation with no acute hemorrhage, appears to have old subdurals bilaterally.  CT scan was read as low-density subdural collections bilateral measuring 1 cm with no mass effect.  Possible acute or recent hemorrhage.      ED Results / Procedures / Treatments   Labs (all labs ordered are listed, but only abnormal results are displayed) Labs interpreted as -   Creatinine elevated, no baseline to compare.  Mildly elevated potassium level at 5.3.  Patient given a fluid bolus.   Labs Reviewed  CBC WITH DIFFERENTIAL/PLATELET - Abnormal; Notable for the following components:      Result Value   Platelets 138 (*)    All other components within normal limits  COMPREHENSIVE METABOLIC PANEL - Abnormal; Notable for the following  components:   Potassium 5.3 (*)    Glucose, Bld 137 (*)    BUN 43 (*)    Creatinine, Ser 1.63 (*)    Alkaline Phosphatase 36 (*)    GFR, Estimated 41 (*)    All other components within normal limits  PROTIME-INR    Consulted neurosurgery and discussed the patient's case with Dr. Marcell Barlow who reviewed the patient's CT scans and felt that this was old subdural hematomas and not acute bleeding.  Patient has had a fall recently.  Recommended holding Eliquis, did not recommend reversing Eliquis at this time.  Does not feel that treating/draining the subdurals would have improvement given his significant atrophy.  Consulted the hospitalist for admission for altered mental status with head injury concerning for TBI with subacute SDH.  PROCEDURES:  Critical Care performed: Yes  .Critical Care  Performed by: Corena Herter, MD Authorized by: Corena Herter, MD   Critical care provider statement:    Critical care time (minutes):  55   Critical care time was exclusive of:  Separately billable procedures and treating other patients   Critical care was necessary to treat or prevent imminent or life-threatening deterioration of the following conditions:  CNS failure or compromise   Critical care was time spent personally by me on the following activities:  Development of treatment plan with patient or surrogate, discussions with consultants, evaluation of patient's response to treatment, examination of patient, ordering and review of laboratory studies, ordering and review of radiographic studies, ordering and performing treatments and interventions, pulse oximetry, re-evaluation of patient's condition and review of old charts .Marland KitchenLaceration Repair  Date/Time: 01/20/2022 10:29 AM  Performed by: Corena Herter, MD Authorized by: Corena Herter, MD   Consent:    Consent obtained:  Verbal   Consent given by:  Patient   Risks, benefits, and alternatives were discussed: yes     Risks discussed:   Pain, nerve damage, poor wound healing, poor cosmetic result, vascular damage, tendon damage, infection and need for additional repair   Alternatives discussed:  Delayed treatment and no treatment Universal protocol:    Procedure explained and questions answered to patient or proxy's satisfaction: yes     Relevant documents present and verified: yes     Test results available: yes     Imaging studies available: yes     Required blood products, implants, devices, and special equipment available: yes     Patient identity confirmed:  Verbally with patient Anesthesia:  Anesthesia method:  None Laceration details:    Location:  Scalp   Scalp location:  Occipital   Length (cm):  3 Pre-procedure details:    Preparation:  Patient was prepped and draped in usual sterile fashion Treatment:    Amount of cleaning:  Standard Skin repair:    Repair method:  Staples   Number of staples:  3 Approximation:    Approximation:  Close Repair type:    Repair type:  Simple Post-procedure details:    Dressing:  Non-adherent dressing   Procedure completion:  Tolerated well, no immediate complications   Patient's presentation is most consistent with acute presentation with potential threat to life or bodily function.   MEDICATIONS ORDERED IN ED: Medications  sodium chloride 0.9 % bolus 1,000 mL (has no administration in time range)    FINAL CLINICAL IMPRESSION(S) / ED DIAGNOSES   Final diagnoses:  Injury of head, initial encounter  Fall, initial encounter  Confusion  SDH (subdural hematoma) (HCC)     Rx / DC Orders   ED Discharge Orders     None        Note:  This document was prepared using Dragon voice recognition software and may include unintentional dictation errors.   Corena Herter, MD 01/20/22 1030    Corena Herter, MD 01/20/22 1043

## 2022-01-21 DIAGNOSIS — S065X0A Traumatic subdural hemorrhage without loss of consciousness, initial encounter: Secondary | ICD-10-CM | POA: Diagnosis not present

## 2022-01-21 DIAGNOSIS — W19XXXA Unspecified fall, initial encounter: Secondary | ICD-10-CM | POA: Diagnosis not present

## 2022-01-21 DIAGNOSIS — S065XAA Traumatic subdural hemorrhage with loss of consciousness status unknown, initial encounter: Secondary | ICD-10-CM | POA: Diagnosis not present

## 2022-01-21 LAB — BASIC METABOLIC PANEL
Anion gap: 10 (ref 5–15)
BUN: 35 mg/dL — ABNORMAL HIGH (ref 8–23)
CO2: 22 mmol/L (ref 22–32)
Calcium: 9.4 mg/dL (ref 8.9–10.3)
Chloride: 107 mmol/L (ref 98–111)
Creatinine, Ser: 1.48 mg/dL — ABNORMAL HIGH (ref 0.61–1.24)
GFR, Estimated: 46 mL/min — ABNORMAL LOW (ref >60)
Glucose, Bld: 133 mg/dL — ABNORMAL HIGH (ref 70–99)
Potassium: 5.1 mmol/L (ref 3.5–5.1)
Sodium: 139 mmol/L (ref 135–145)

## 2022-01-21 MED ORDER — ELIQUIS 5 MG PO TABS: 5.0000 mg | ORAL_TABLET | Freq: Two times a day (BID) | ORAL | Status: DC

## 2022-01-21 NOTE — ED Notes (Signed)
Pt refusing ordered BG checks and insulin, stating he is "diet controlled". MD Fran Lowes notified.

## 2022-01-21 NOTE — Discharge Summary (Signed)
Physician Discharge Summary   Troy Mueller  male DOB: 02/23/1933  GNF:[id number]  PCP: Kandyce RudBabaoff, Marcus, MD  Admit date: 01/20/2022 Discharge date: 01/21/2022  Admitted From: home Disposition:  home CODE STATUS: Full code  Discharge Instructions     Diet Carb Modified   Complete by: As directed    Discharge instructions   Complete by: As directed    You have some old bleeding in your brain, but no obvious new bleeds.  Neurosurgeon recommends holding your Eliquis for 10 days, to be resumed on 02/01/22.  Your potassium level was a little bit high.  Please follow up with primary care doctor in about a week for followup and to check potassium level.   Dr. Darlin Priestlyina Deontrey Massi - -   No wound care   Complete by: As directed       Hospital Course:  For full details, please see H&P, progress notes, consult notes and ancillary notes.  Briefly,  Troy Mueller is a 88 y.o. male with medical history significant of hypertension, diabetes, hypothyroidism, DVT and atrial fibrillation on Eliquis, CKD-3B, who presented with fall, head injury and altered mental status.   Per his caregiver at bedside, pt slipped on area rugs that are next to his bed and had fall, hitting his head. No LOC.  SDH (subdural hematoma) [facility](HCC):  EDP consulted Dr. Myer HaffYarbrough of neurosurgery.  He reviewed the patient's CT scans and felt that pt had chronic subdural hematomas with possible small amount of hyperdense fluid.  He recommended holding Eliquis, did not recommend reversing Eliquis at this time. Does not feel that treating/draining the subdurals would have improvement given his significant atrophy. --repeat CT head was stable, pt was cleared for discharge by Neurosurgery. --Home Eliquis will be held for 10 days, and then resume. --Can follow up with Dr. Myer HaffYarbrough only as needed.   Fall at home, initial encounter --fall was accidental.  Pt has in-home caregiver and felt comfortable and ready to return home.    Hyperkalemia:  Potassium of 5.3.  Lokelma ordered but pt refused.  Potassium normalized to 5.1 next day. --f/u with PCP to check level in 1 week.   Acute metabolic encephalopathy:  May be due to concussion.  Mental status back to baseline the next day.   DVT (deep venous thrombosis) (HCC) -Hold Eliquis, resume after 10 days.   Atrial fibrillation, chronic (HCC):  -Hold Eliquis, resume after 10 days. -cont home Coreg   HTN (hypertension) -Continue amlodipine, Coreg and ramipril   Chronic kidney disease, stage 3b [facility](HCC): Renal function stable.  Recent baseline creatinine 1.8 on 07/21/2021.  His creatinine is 1.62, BUN 43, GFR 41   Type II diabetes mellitus with renal manifestations [facility](HCC):  Recent A1c 6.6, well controlled.   Resume home metformin and glipizide after discharge.   HLD; -cont Zetia, fenofibrate, Crestor   Obesity: with BMI= 37.66 and BW= 122.5 kg   Discharge Diagnoses:  Principal Problem:   SDH (subdural hematoma) (HCC) Active Problems:   Fall at home, initial encounter   Hyperkalemia   Acute metabolic encephalopathy   DVT (deep venous thrombosis) (HCC)   Atrial fibrillation, chronic (HCC)   HTN (hypertension)   Chronic kidney disease, stage 3b (HCC)   Type II diabetes mellitus with renal manifestations (HCC)   HLD (hyperlipidemia)   Obesity (BMI 30-39.9)   30 Day Unplanned Readmission Risk Score    Flowsheet Row ED [facility]20/20[facility]  30 Day  Unplanned Readmission Risk Score (%) 12.54 Filed at 01/21/2022 0801       This score is the patient's risk of an unplanned readmission within 30 days of being discharged (0 -100%). The score is based on dignosis, age, lab data, medications, orders, and past utilization.   Low:  0-14.9   Medium: 15-21.9   High: 22-29.9   Extreme: 30 and above         Discharge Instructions:  Allergies as of 01/21/2022       Reactions   Tylenol [acetaminophen]    rash         Medication List     TAKE these medications    amLODipine 5 MG tablet Commonly known as: NORVASC Take 5 mg by mouth daily.   carvedilol 12.5 MG tablet Commonly known as: COREG Take 12.5 mg by mouth 2 (two) times daily.   Eliquis 5 MG Tabs tablet Generic drug: apixaban Take 1 tablet (5 mg total) by mouth 2 (two) times daily. Home med. Start taking on: February 01, 2022 What changed:  additional instructions These instructions start on February 01, 2022. If you are unsure what to do until then, ask your doctor or other care provider.   ezetimibe 10 MG tablet Commonly known as: ZETIA Take 10 mg by mouth daily.   fenofibrate 54 MG tablet Take 54 mg by mouth daily.   glipiZIDE 5 MG 24 hr tablet Commonly known as: GLUCOTROL XL Take 5 mg by mouth daily.   Jublia 10 % Soln Generic drug: Efinaconazole Apply topically daily.   levothyroxine 25 MCG tablet Commonly known as: SYNTHROID Take 25 mcg by mouth every morning.   metFORMIN 500 MG 24 hr tablet Commonly known as: GLUCOPHAGE-XR 500 mg 2 (two) times daily with a meal.   omega-3 acid ethyl esters 1 g capsule Commonly known as: LOVAZA Take 2 capsules by mouth 2 (two) times daily.   PreviDent 5000 Plus 1.1 % Crea dental cream Generic drug: sodium fluoride Place 1 Application onto teeth at bedtime.   ramipril 5 MG capsule Commonly known as: ALTACE Take by mouth.   rosuvastatin 10 MG tablet Commonly known as: CRESTOR Take 1 tablet by mouth daily.         Follow-up Information     Venetia Night, MD Follow up.   Specialty: Neurosurgery Why: follow up only as needed. Contact information: [address] [place] [address] [phone]         Kandyce Rud, MD Follow up in 1 week(s).   Specialty: Family Medicine Contact information: [address] [facility] [place] [address] [phone]                  Allergies  Allergen Reactions   Tylenol [Acetaminophen]     rash     The results of significant diagnostics from this hospitalization (including imaging, microbiology, ancillary and laboratory) are listed below for reference.   Consultations:   Procedures/Studies: CT HEAD WO CONTRAST ( )  Result Date: 01/20/2022 CLINICAL DATA:  Follow-up subdural EXAM: CT HEAD WITHOUT CONTRAST TECHNIQUE: Contiguous axial images were obtained from the base of the skull through the vertex without intravenous contrast. RADIATION DOSE REDUCTION: This exam was performed according to the departmental dose-optimization program which includes automated exposure control, adjustment of the mA and/or kV according to patient size and/or use of iterative reconstruction technique. COMPARISON:  Same-day CT brain FINDINGS: Brain: Overall there is no significant change in  the appearance of bilateral predominantly low-density subdural fluid collections. The periphery of these fluid collections appear slightly hyperdense with unchanged appearance of the focal hyperdensity seen along the high left parietal convexity (series 5, image 34). No parenchymal hemorrhage. There is advanced generalized volume loss. No CT evidence of an infarct. No hydrocephalus. Vascular: Redemonstrated are extensive calcifications along the course of bilateral MCAs. Skull: Redemonstrated is irregular and somewhat erosive appearance of the bilateral sphenoid wings (series 2, image 10). This was likely present in 2006, appears more conspicuous on the current exam. In the absence of a known malignancy or other metabolic abnormality, this is nonspecific, and likely benign finding. Sinuses/Orbits: No acute finding. Other: Nonspecific 1.1 cm hyperdense lesion along the right posterior neck (series 3, image 1), possibly a sebaceous cyst. IMPRESSION: Unchanged appearance of bilateral predominantly low-density subdural fluid collections. The periphery of these  fluid collections appears slightly hyperdense with unchanged appearance of the focal hyperdensity seen along the high left parietal convexity. Electronically Signed   By: Lorenza Cambridge M.D.   On: 01/20/2022 16:56   CT Cervical Spine Wo Contrast  Result Date: 01/20/2022 CLINICAL DATA:  Head trauma, minor (Age >= 65y); Neck trauma (Age >= 65y) EXAM: CT HEAD WITHOUT CONTRAST CT CERVICAL SPINE WITHOUT CONTRAST TECHNIQUE: Multidetector CT imaging of the head and cervical spine was performed following the standard protocol without intravenous contrast. Multiplanar CT image reconstructions of the cervical spine were also generated. RADIATION DOSE REDUCTION: This exam was performed according to the departmental dose-optimization program which includes automated exposure control, adjustment of the mA and/or kV according to patient size and/or use of iterative reconstruction technique. COMPARISON:  None Available. FINDINGS: CT HEAD FINDINGS Brain: Suspected low-density subdural collections bilaterally, difficult to measure but estimated to be approximately 1 cm bilaterally. A small focus of hyperdensity along the high left parietal convexity (series 2, image 25; series 5, image 35) is concerning for acute/recent extra-axial hemorrhage. No significant mass effect. Vascular: Calcific atherosclerosis. Skull: No acute fracture. Sinuses/Orbits: Clear sinuses.  No acute orbital findings. Other: No mastoid effusions. CT CERVICAL SPINE FINDINGS Alignment: No substantial sagittal subluxation. Skull base and vertebrae: No evidence of acute fracture. Soft tissues and spinal canal: No prevertebral fluid or swelling. No visible canal hematoma. Disc levels: Multilevel facet uncovertebral hypertrophy with varying degrees of neural foraminal stenosis, probably at least moderate on the right at C5-C6. Upper chest: Visualized lung apices are clear. IMPRESSION: 1. Suspected low-density subdural collections bilaterally, difficult to measure  but estimated to be approximately 1 cm bilaterally. A small focus of hyperdensity along the high left parietal convexity (series 2, image 25; series 5, image 35) is concerning for acute/recent extra-axial hemorrhage. No significant midline shift. 2. No evidence of acute fracture or traumatic malalignment in the cervical spine. 3. Multilevel facet uncovertebral hypertrophy with varying degrees of neural foraminal stenosis, probably at least moderate on the right at C5-C6. MRI could further characterize if clinically warranted. Electronically Signed   By: Feliberto Harts M.D.   On: 01/20/2022 09:04   CT Head Wo Contrast  Result Date: 01/20/2022 CLINICAL DATA:  Head trauma, minor (Age >= 65y); Neck trauma (Age >= 65y) EXAM: CT HEAD WITHOUT CONTRAST CT CERVICAL SPINE WITHOUT CONTRAST TECHNIQUE: Multidetector CT imaging of the head and cervical spine was performed following the standard protocol without intravenous contrast. Multiplanar CT image reconstructions of the cervical spine were also generated. RADIATION DOSE REDUCTION: This exam was performed according to the departmental dose-optimization program which includes automated exposure  control, adjustment of the mA and/or kV according to patient size and/or use of iterative reconstruction technique. COMPARISON:  None Available. FINDINGS: CT HEAD FINDINGS Brain: Suspected low-density subdural collections bilaterally, difficult to measure but estimated to be approximately 1 cm bilaterally. A small focus of hyperdensity along the high left parietal convexity (series 2, image 25; series 5, image 35) is concerning for acute/recent extra-axial hemorrhage. No significant mass effect. Vascular: Calcific atherosclerosis. Skull: No acute fracture. Sinuses/Orbits: Clear sinuses.  No acute orbital findings. Other: No mastoid effusions. CT CERVICAL SPINE FINDINGS Alignment: No substantial sagittal subluxation. Skull base and vertebrae: No evidence of acute fracture. Soft  tissues and spinal canal: No prevertebral fluid or swelling. No visible canal hematoma. Disc levels: Multilevel facet uncovertebral hypertrophy with varying degrees of neural foraminal stenosis, probably at least moderate on the right at C5-C6. Upper chest: Visualized lung apices are clear. IMPRESSION: 1. Suspected low-density subdural collections bilaterally, difficult to measure but estimated to be approximately 1 cm bilaterally. A small focus of hyperdensity along the high left parietal convexity (series 2, image 25; series 5, image 35) is concerning for acute/recent extra-axial hemorrhage. No significant midline shift. 2. No evidence of acute fracture or traumatic malalignment in the cervical spine. 3. Multilevel facet uncovertebral hypertrophy with varying degrees of neural foraminal stenosis, probably at least moderate on the right at C5-C6. MRI could further characterize if clinically warranted. Electronically Signed   By: Feliberto Harts M.D.   On: 01/20/2022 09:04      Labs: BNP (last 3 results) No results for input(s): "BNP" in the last 8760 hours. Basic Metabolic Panel: Recent Labs  Lab 01/20/22 1010 01/21/22 0508  NA 139 139  K 5.3* 5.1  CL 108 107  CO2 24 22  GLUCOSE 137* 133*  BUN 43* 35*  CREATININE 1.63* 1.48*  CALCIUM 9.8 9.4   Liver Function Tests: Recent Labs  Lab 01/20/22 1010  AST 18  ALT 15  ALKPHOS 36*  BILITOT 0.6  PROT 7.1  ALBUMIN 3.7   No results for input(s): "LIPASE", "AMYLASE" in the last 168 hours. No results for input(s): "AMMONIA" in the last 168 hours. CBC: Recent Labs  Lab 01/20/22 1010  WBC 5.6  NEUTROABS 4.2  HGB 13.5  HCT 42.6  MCV 94.5  PLT 138*   Cardiac Enzymes: No results for input(s): "CKTOTAL", "CKMB", "CKMBINDEX", "TROPONINI" in the last 168 hours. BNP: Invalid input(s): "POCBNP" CBG: Recent Labs  Lab 01/20/22 2217  GLUCAP 114*   D-Dimer No results for input(s): "DDIMER" in the last 72 hours. Hgb A1c No results  for input(s): "HGBA1C" in the last 72 hours. Lipid Profile No results for input(s): "CHOL", "HDL", "LDLCALC", "TRIG", "CHOLHDL", "LDLDIRECT" in the last 72 hours. Thyroid function studies No results for input(s): "TSH", "T4TOTAL", "T3FREE", "THYROIDAB" in the last 72 hours.  Invalid input(s): "FREET3" Anemia work up No results for input(s): "VITAMINB12", "FOLATE", "FERRITIN", "TIBC", "IRON", "RETICCTPCT" in the last 72 hours. Urinalysis    Component Value Date/Time   COLORURINE YELLOW (A) 01/20/2022 2248   APPEARANCEUR CLEAR (A) 01/20/2022 2248   LABSPEC 1.013 01/20/2022 2248   PHURINE 6.0 01/20/2022 2248   GLUCOSEU NEGATIVE 01/20/2022 2248   HGBUR NEGATIVE 01/20/2022 2248   BILIRUBINUR NEGATIVE 01/20/2022 2248   KETONESUR NEGATIVE 01/20/2022 2248   PROTEINUR NEGATIVE 01/20/2022 2248   NITRITE NEGATIVE 01/20/2022 2248   LEUKOCYTESUR NEGATIVE 01/20/2022 2248   Sepsis Labs Recent Labs  Lab 01/20/22 1010  WBC 5.6   Microbiology No results found  for this or any previous visit (from the past 240 hour(s)).   Total time spend on discharging this patient, including the last patient exam, discussing the hospital stay, instructions for ongoing care as it relates to all pertinent caregivers, as well as preparing the medical discharge records, prescriptions, and/or referrals as applicable, is 40 minutes.    Darlin Priestly, MD  [facility] 01/21/2022, 9:43 AM

## 2022-01-21 NOTE — Consult Note (Signed)
Referring Physician:  No referring provider defined for this encounter.  Primary Physician:  Troy Rud, MD  History of Present Illness: 01/21/2022 [name] is here today with a chief complaint of fall yesterday.  He suffered a cut and underwent CT scan of the head which showed chronic subdural hematomas.  He is on therapeutic anticoagulation which has been held.  He was initially altered, but feels that he is back to his baseline health.  He has no other complaints currently.  He would like to return home today. Review of Systems:  A 10 point review of systems is negative, except for the pertinent positives and negatives detailed in the HPI.  Past Medical History: Past Medical History:  Diagnosis Date   Diabetes mellitus without complication (HCC)    DVT (deep venous thrombosis) (HCC)    Embolus (HCC)    HTN (hypertension)     Past Surgical History: Past Surgical History:  Procedure Laterality Date   TOTAL HIP ARTHROPLASTY      Allergies: Allergies as of 01/20/2022 - Review Complete 01/20/2022  Allergen Reaction Noted   Tylenol [acetaminophen]  01/20/2022    Medications: Current Meds  Medication Sig   amLODipine (NORVASC) 5 MG tablet Take 5 mg by mouth daily.   carvedilol (COREG) 12.5 MG tablet Take 12.5 mg by mouth 2 (two) times daily.   ELIQUIS 5 MG TABS tablet Take 5 mg by mouth 2 (two) times daily.   ezetimibe (ZETIA) 10 MG tablet Take 10 mg by mouth daily.   fenofibrate 54 MG tablet Take 54 mg by mouth daily.   glipiZIDE (GLUCOTROL XL) 5 MG 24 hr tablet Take 5 mg by mouth daily.   JUBLIA 10 % SOLN Apply topically daily.   levothyroxine (SYNTHROID) 25 MCG tablet Take 25 mcg by mouth every morning.   metFORMIN (GLUCOPHAGE-XR) 500 MG 24 hr tablet 500 mg 2 (two) times daily with a meal.   omega-3 acid ethyl esters (LOVAZA) 1 g capsule Take 2 capsules by mouth 2 (two) times daily.   PREVIDENT 5000 PLUS 1.1 % CREA dental cream Place 1 Application  onto teeth at bedtime.   ramipril (ALTACE) 5 MG capsule Take by mouth.   rosuvastatin (CRESTOR) 10 MG tablet Take 1 tablet by mouth daily.    Social History: Social History   Tobacco Use   Smoking status: Never   Smokeless tobacco: Never  Substance Use Topics   Alcohol use: Not Currently   Drug use: Never    Family Medical History: Family History  Problem Relation Age of Onset   Bladder Cancer Brother     Physical Examination: Vitals:   01/21/22 0200 01/21/22 0515  BP: (!) 166/64 131/69  Pulse: 69 66  Resp: 20 (!) 24  Temp: 97.8 F (36.6 C) 97.9 F (36.6 C)  SpO2: 98% 98%    General: Patient is well developed, well nourished, calm, collected, and in no apparent distress. Attention to examination is appropriate.  Neck:   Supple.  Full range of motion.  Respiratory: Patient is breathing without any difficulty.   NEUROLOGICAL:     Awake, alert, oriented.  Cranial nerves appear intact.  He moves all extremities with apparently full strength.  Medical Decision Making  Imaging: CT Head 01/20/22 IMPRESSION: Unchanged appearance of bilateral predominantly low-density subdural fluid collections. The periphery of these fluid collections appears slightly hyperdense with unchanged appearance of the focal hyperdensity seen along the high left parietal convexity.     Electronically Signed  By: Troy Mueller M.D.   On: 01/20/2022 16:56  I have personally reviewed the images and agree with the above interpretation.  Assessment and Plan: [name] is a pleasant 88 y.o. male with chronic subdural hematomas with possible small amount of hyperdense fluid.  I would recommend holding his Eliquis for 10 days.  He can then restart his medication.  He would likely not benefit from drainage of his subdural hematomas given the chronicity and the amount of atrophy he suffers from.  I will defer further care to his primary hospitalist.  He will likely need physical therapy  and be suitable for discharge soon.  I am happy to follow him up in 4 to 6 weeks if he continues to have symptoms.       Geri Troy K. Myer Haff MD, [facility]surgery

## 2023-01-01 ENCOUNTER — Emergency Department

## 2023-01-01 ENCOUNTER — Other Ambulatory Visit: Payer: Self-pay

## 2023-01-01 ENCOUNTER — Inpatient Hospital Stay
Admission: EM | Admit: 2023-01-01 | Discharge: 2023-01-06 | DRG: 871 | Disposition: A | Discharge status: Hospice | Attending: Obstetrics and Gynecology | Admitting: Obstetrics and Gynecology

## 2023-01-01 DIAGNOSIS — I480 Paroxysmal atrial fibrillation: Secondary | ICD-10-CM | POA: Diagnosis present

## 2023-01-01 DIAGNOSIS — G4733 Obstructive sleep apnea (adult) (pediatric): Secondary | ICD-10-CM | POA: Diagnosis present

## 2023-01-01 DIAGNOSIS — Z96641 Presence of right artificial hip joint: Secondary | ICD-10-CM | POA: Diagnosis present

## 2023-01-01 DIAGNOSIS — I129 Hypertensive chronic kidney disease with stage 1 through stage 4 chronic kidney disease, or unspecified chronic kidney disease: Secondary | ICD-10-CM | POA: Diagnosis present

## 2023-01-01 DIAGNOSIS — E875 Hyperkalemia: Secondary | ICD-10-CM | POA: Diagnosis present

## 2023-01-01 DIAGNOSIS — E669 Obesity, unspecified: Secondary | ICD-10-CM | POA: Diagnosis present

## 2023-01-01 DIAGNOSIS — E782 Mixed hyperlipidemia: Secondary | ICD-10-CM | POA: Diagnosis present

## 2023-01-01 DIAGNOSIS — I482 Chronic atrial fibrillation, unspecified: Secondary | ICD-10-CM | POA: Diagnosis present

## 2023-01-01 DIAGNOSIS — J9621 Acute and chronic respiratory failure with hypoxia: Secondary | ICD-10-CM | POA: Diagnosis present

## 2023-01-01 DIAGNOSIS — Z515 Encounter for palliative care: Secondary | ICD-10-CM | POA: Diagnosis not present

## 2023-01-01 DIAGNOSIS — A419 Sepsis, unspecified organism: Principal | ICD-10-CM | POA: Diagnosis present

## 2023-01-01 DIAGNOSIS — Z7901 Long term (current) use of anticoagulants: Secondary | ICD-10-CM

## 2023-01-01 DIAGNOSIS — Z79899 Other long term (current) drug therapy: Secondary | ICD-10-CM

## 2023-01-01 DIAGNOSIS — N1832 Chronic kidney disease, stage 3b: Secondary | ICD-10-CM | POA: Diagnosis present

## 2023-01-01 DIAGNOSIS — J189 Pneumonia, unspecified organism: Secondary | ICD-10-CM | POA: Diagnosis present

## 2023-01-01 DIAGNOSIS — Z23 Encounter for immunization: Secondary | ICD-10-CM

## 2023-01-01 DIAGNOSIS — F0390 Unspecified dementia without behavioral disturbance: Secondary | ICD-10-CM | POA: Diagnosis present

## 2023-01-01 DIAGNOSIS — Z886 Allergy status to analgesic agent status: Secondary | ICD-10-CM

## 2023-01-01 DIAGNOSIS — G928 Other toxic encephalopathy: Secondary | ICD-10-CM | POA: Diagnosis present

## 2023-01-01 DIAGNOSIS — N179 Acute kidney failure, unspecified: Secondary | ICD-10-CM | POA: Diagnosis present

## 2023-01-01 DIAGNOSIS — Z8052 Family history of malignant neoplasm of bladder: Secondary | ICD-10-CM

## 2023-01-01 DIAGNOSIS — Z7984 Long term (current) use of oral hypoglycemic drugs: Secondary | ICD-10-CM | POA: Diagnosis not present

## 2023-01-01 DIAGNOSIS — Z1152 Encounter for screening for COVID-19: Secondary | ICD-10-CM

## 2023-01-01 DIAGNOSIS — M1612 Unilateral primary osteoarthritis, left hip: Secondary | ICD-10-CM | POA: Diagnosis present

## 2023-01-01 DIAGNOSIS — E1122 Type 2 diabetes mellitus with diabetic chronic kidney disease: Secondary | ICD-10-CM | POA: Diagnosis present

## 2023-01-01 DIAGNOSIS — E039 Hypothyroidism, unspecified: Secondary | ICD-10-CM | POA: Diagnosis present

## 2023-01-01 DIAGNOSIS — Z8673 Personal history of transient ischemic attack (TIA), and cerebral infarction without residual deficits: Secondary | ICD-10-CM

## 2023-01-01 DIAGNOSIS — R0689 Other abnormalities of breathing: Secondary | ICD-10-CM

## 2023-01-01 DIAGNOSIS — J9601 Acute respiratory failure with hypoxia: Principal | ICD-10-CM | POA: Diagnosis present

## 2023-01-01 DIAGNOSIS — Z79890 Hormone replacement therapy (postmenopausal): Secondary | ICD-10-CM

## 2023-01-01 DIAGNOSIS — Z6838 Body mass index (BMI) 38.0-38.9, adult: Secondary | ICD-10-CM

## 2023-01-01 DIAGNOSIS — Z86718 Personal history of other venous thrombosis and embolism: Secondary | ICD-10-CM

## 2023-01-01 DIAGNOSIS — F444 Conversion disorder with motor symptom or deficit: Secondary | ICD-10-CM | POA: Diagnosis present

## 2023-01-01 DIAGNOSIS — K8020 Calculus of gallbladder without cholecystitis without obstruction: Secondary | ICD-10-CM | POA: Diagnosis present

## 2023-01-01 DIAGNOSIS — R6520 Severe sepsis without septic shock: Secondary | ICD-10-CM | POA: Diagnosis present

## 2023-01-01 DIAGNOSIS — I10 Essential (primary) hypertension: Secondary | ICD-10-CM | POA: Diagnosis present

## 2023-01-01 DIAGNOSIS — Z95828 Presence of other vascular implants and grafts: Secondary | ICD-10-CM

## 2023-01-01 DIAGNOSIS — I4891 Unspecified atrial fibrillation: Secondary | ICD-10-CM | POA: Diagnosis present

## 2023-01-01 DIAGNOSIS — I82409 Acute embolism and thrombosis of unspecified deep veins of unspecified lower extremity: Secondary | ICD-10-CM | POA: Diagnosis present

## 2023-01-01 DIAGNOSIS — Z86711 Personal history of pulmonary embolism: Secondary | ICD-10-CM

## 2023-01-01 DIAGNOSIS — E1129 Type 2 diabetes mellitus with other diabetic kidney complication: Secondary | ICD-10-CM | POA: Diagnosis present

## 2023-01-01 DIAGNOSIS — J9622 Acute and chronic respiratory failure with hypercapnia: Secondary | ICD-10-CM | POA: Diagnosis present

## 2023-01-01 DIAGNOSIS — J9612 Chronic respiratory failure with hypercapnia: Secondary | ICD-10-CM | POA: Diagnosis present

## 2023-01-01 LAB — CBC WITH DIFFERENTIAL/PLATELET
Abs Immature Granulocytes: 0.04 10*3/uL (ref 0.00–0.07)
Basophils Absolute: 0 10*3/uL (ref 0.0–0.1)
Basophils Relative: 0 %
Eosinophils Absolute: 0.1 10*3/uL (ref 0.0–0.5)
Eosinophils Relative: 3 %
HCT: 42.3 % (ref 39.0–52.0)
Hemoglobin: 12 g/dL — ABNORMAL LOW (ref 13.0–17.0)
Immature Granulocytes: 1 %
Lymphocytes Relative: 14 %
Lymphs Abs: 0.7 10*3/uL (ref 0.7–4.0)
MCH: 30.2 pg (ref 26.0–34.0)
MCHC: 28.4 g/dL — ABNORMAL LOW (ref 30.0–36.0)
MCV: 106.3 fL — ABNORMAL HIGH (ref 80.0–100.0)
Monocytes Absolute: 0.4 10*3/uL (ref 0.1–1.0)
Monocytes Relative: 7 %
Neutro Abs: 3.6 10*3/uL (ref 1.7–7.7)
Neutrophils Relative %: 75 %
Platelets: 133 10*3/uL — ABNORMAL LOW (ref 150–400)
RBC: 3.98 MIL/uL — ABNORMAL LOW (ref 4.22–5.81)
RDW: 14 % (ref 11.5–15.5)
WBC: 4.8 10*3/uL (ref 4.0–10.5)
nRBC: 0 % (ref 0.0–0.2)

## 2023-01-01 LAB — SARS CORONAVIRUS 2 BY RT PCR: SARS Coronavirus 2 by RT PCR: NEGATIVE

## 2023-01-01 LAB — BLOOD GAS, VENOUS
Acid-Base Excess: 4.2 mmol/L — ABNORMAL HIGH (ref 0.0–2.0)
Bicarbonate: 36 mmol/L — ABNORMAL HIGH (ref 20.0–28.0)
O2 Saturation: 95.7 %
Patient temperature: 37
pCO2, Ven: 101 mm[Hg] (ref 44–60)
pH, Ven: 7.16 — CL (ref 7.25–7.43)
pO2, Ven: 79 mm[Hg] — ABNORMAL HIGH (ref 32–45)

## 2023-01-01 LAB — COMPREHENSIVE METABOLIC PANEL
ALT: 14 U/L (ref 0–44)
AST: 14 U/L — ABNORMAL LOW (ref 15–41)
Albumin: 3.5 g/dL (ref 3.5–5.0)
Alkaline Phosphatase: 29 U/L — ABNORMAL LOW (ref 38–126)
Anion gap: 5 (ref 5–15)
BUN: 50 mg/dL — ABNORMAL HIGH (ref 8–23)
CO2: 31 mmol/L (ref 22–32)
Calcium: 9.4 mg/dL (ref 8.9–10.3)
Chloride: 105 mmol/L (ref 98–111)
Creatinine, Ser: 1.86 mg/dL — ABNORMAL HIGH (ref 0.61–1.24)
GFR, Estimated: 34 mL/min — ABNORMAL LOW (ref >60)
Glucose, Bld: 163 mg/dL — ABNORMAL HIGH (ref 70–99)
Potassium: 6.9 mmol/L (ref 3.5–5.1)
Sodium: 141 mmol/L (ref 135–145)
Total Bilirubin: 0.6 mg/dL (ref 0.3–1.2)
Total Protein: 6.7 g/dL (ref 6.5–8.1)

## 2023-01-01 LAB — URINALYSIS, W/ REFLEX TO CULTURE (INFECTION SUSPECTED)
Bilirubin Urine: NEGATIVE
Glucose, UA: NEGATIVE mg/dL
Hgb urine dipstick: NEGATIVE
Ketones, ur: NEGATIVE mg/dL
Leukocytes,Ua: NEGATIVE
Nitrite: NEGATIVE
Protein, ur: NEGATIVE mg/dL
Specific Gravity, Urine: 1.018 (ref 1.005–1.030)
Squamous Epithelial / HPF: 0 /[HPF] (ref 0–5)
pH: 5 (ref 5.0–8.0)

## 2023-01-01 LAB — LACTIC ACID, PLASMA: Lactic Acid, Venous: 0.7 mmol/L (ref 0.5–1.9)

## 2023-01-01 LAB — APTT: aPTT: 34 s (ref 24–36)

## 2023-01-01 LAB — PROTIME-INR
INR: 1.3 — ABNORMAL HIGH (ref 0.8–1.2)
Prothrombin Time: 16 s — ABNORMAL HIGH (ref 11.4–15.2)

## 2023-01-01 LAB — BRAIN NATRIURETIC PEPTIDE: B Natriuretic Peptide: 336.4 pg/mL — ABNORMAL HIGH (ref 0.0–100.0)

## 2023-01-01 LAB — GLUCOSE, CAPILLARY: Glucose-Capillary: 125 mg/dL — ABNORMAL HIGH (ref 70–99)

## 2023-01-01 LAB — PROCALCITONIN: Procalcitonin: <0.1 ng/mL

## 2023-01-01 LAB — MRSA NEXT GEN BY PCR, NASAL: MRSA by PCR Next Gen: NOT DETECTED

## 2023-01-01 LAB — TROPONIN I (HIGH SENSITIVITY): Troponin I (High Sensitivity): 18 ng/L — ABNORMAL HIGH (ref <18)

## 2023-01-01 MED ORDER — IOHEXOL 350 MG/ML SOLN
75.0000 mL | Freq: Once | INTRAVENOUS | Status: AC | PRN
  Administered 2023-01-01: 75 mL via INTRAVENOUS

## 2023-01-01 MED ORDER — LACTATED RINGERS IV BOLUS
1000.0000 mL | Freq: Once | INTRAVENOUS | Status: AC
  Administered 2023-01-01: 1000 mL via INTRAVENOUS

## 2023-01-01 MED ORDER — PROPOFOL 1000 MG/100ML IV EMUL
INTRAVENOUS | Status: AC
  Filled 2023-01-01: qty 100

## 2023-01-01 MED ORDER — ORAL CARE MOUTH RINSE
15.0000 mL | OROMUCOSAL | Status: DC
  Administered 2023-01-02 (×7): 15 mL via OROMUCOSAL

## 2023-01-01 MED ORDER — IPRATROPIUM-ALBUTEROL 0.5-2.5 (3) MG/3ML IN SOLN
3.0000 mL | Freq: Four times a day (QID) | RESPIRATORY_TRACT | Status: DC
  Administered 2023-01-01 – 2023-01-02 (×3): 3 mL via RESPIRATORY_TRACT
  Filled 2023-01-01 (×3): qty 3

## 2023-01-01 MED ORDER — ROCURONIUM BROMIDE 50 MG/5ML IV SOLN
100.0000 mg | Freq: Once | INTRAVENOUS | Status: AC
  Filled 2023-01-01: qty 10

## 2023-01-01 MED ORDER — POLYETHYLENE GLYCOL 3350 17 G PO PACK: 17.0000 g | PACK | Freq: Every day | ORAL | Status: DC | PRN

## 2023-01-01 MED ORDER — CALCIUM GLUCONATE 10 % IV SOLN
1.0000 g | Freq: Once | INTRAVENOUS | Status: AC
  Administered 2023-01-01: 1 g via INTRAVENOUS
  Filled 2023-01-01: qty 10

## 2023-01-01 MED ORDER — ORAL CARE MOUTH RINSE: 15.0000 mL | OROMUCOSAL | Status: DC | PRN

## 2023-01-01 MED ORDER — HEPARIN SODIUM (PORCINE) 5000 UNIT/ML IJ SOLN
5000.0000 [IU] | Freq: Three times a day (TID) | INTRAMUSCULAR | Status: DC
  Administered 2023-01-01 – 2023-01-03 (×5): 5000 [IU] via SUBCUTANEOUS
  Filled 2023-01-01 (×5): qty 1

## 2023-01-01 MED ORDER — POLYETHYLENE GLYCOL 3350 17 G PO PACK
17.0000 g | PACK | Freq: Every day | ORAL | Status: DC
Start: 2023-01-01 — End: 2023-01-04
  Administered 2023-01-02 – 2023-01-04 (×2): 17 g
  Filled 2023-01-01 (×2): qty 1

## 2023-01-01 MED ORDER — HYDROMORPHONE HCL 1 MG/ML IJ SOLN
1.0000 mg | Freq: Once | INTRAMUSCULAR | Status: AC
  Administered 2023-01-01: 1 mg via INTRAVENOUS
  Filled 2023-01-01: qty 1

## 2023-01-01 MED ORDER — CHLORHEXIDINE GLUCONATE CLOTH 2 % EX PADS
6.0000 | MEDICATED_PAD | Freq: Every day | CUTANEOUS | Status: DC
  Administered 2023-01-01 – 2023-01-04 (×4): 6 via TOPICAL

## 2023-01-01 MED ORDER — DOCUSATE SODIUM 50 MG/5ML PO LIQD: 100.0000 mg | Freq: Two times a day (BID) | ORAL | Status: DC | PRN

## 2023-01-01 MED ORDER — FAMOTIDINE IN NACL 20-0.9 MG/50ML-% IV SOLN
20.0000 mg | Freq: Two times a day (BID) | INTRAVENOUS | Status: DC
  Administered 2023-01-01 – 2023-01-02 (×2): 20 mg via INTRAVENOUS
  Filled 2023-01-01 (×2): qty 50

## 2023-01-01 MED ORDER — SODIUM BICARBONATE 8.4 % IV SOLN
50.0000 meq | Freq: Once | INTRAVENOUS | Status: AC
Start: 2023-01-01 — End: 2023-01-01
  Administered 2023-01-01: 50 meq via INTRAVENOUS
  Filled 2023-01-01: qty 50

## 2023-01-01 MED ORDER — INSULIN ASPART 100 UNIT/ML IJ SOLN
0.0000 [IU] | INTRAMUSCULAR | Status: DC
  Administered 2023-01-02 (×2): 3 [IU] via SUBCUTANEOUS
  Administered 2023-01-02 – 2023-01-03 (×3): 4 [IU] via SUBCUTANEOUS
  Administered 2023-01-03 – 2023-01-04 (×4): 3 [IU] via SUBCUTANEOUS
  Administered 2023-01-04: 7 [IU] via SUBCUTANEOUS
  Administered 2023-01-04 (×2): 4 [IU] via SUBCUTANEOUS
  Administered 2023-01-05: 11 [IU] via SUBCUTANEOUS
  Administered 2023-01-05: 4 [IU] via SUBCUTANEOUS
  Administered 2023-01-05 (×2): 7 [IU] via SUBCUTANEOUS
  Administered 2023-01-06: 11 [IU] via SUBCUTANEOUS
  Administered 2023-01-06: 4 [IU] via SUBCUTANEOUS
  Administered 2023-01-06: 3 [IU] via SUBCUTANEOUS
  Filled 2023-01-01 (×19): qty 1

## 2023-01-01 MED ORDER — ROCURONIUM BROMIDE 10 MG/ML (PF) SYRINGE
PREFILLED_SYRINGE | INTRAVENOUS | Status: AC
  Administered 2023-01-01: 100 mg via INTRAVENOUS
  Filled 2023-01-01: qty 10

## 2023-01-01 MED ORDER — PROPOFOL 1000 MG/100ML IV EMUL
INTRAVENOUS | Status: AC
  Administered 2023-01-01: 5 ug/kg/min via INTRAVENOUS
  Filled 2023-01-01: qty 100

## 2023-01-01 MED ORDER — DOCUSATE SODIUM 100 MG PO CAPS: 100.0000 mg | ORAL_CAPSULE | Freq: Two times a day (BID) | ORAL | Status: DC | PRN

## 2023-01-01 MED ORDER — PROPOFOL 1000 MG/100ML IV EMUL: 0.0000 ug/kg/min | INTRAVENOUS | Status: DC

## 2023-01-01 MED ORDER — FAMOTIDINE 20 MG PO TABS: 20.0000 mg | ORAL_TABLET | Freq: Two times a day (BID) | ORAL | Status: DC

## 2023-01-01 MED ORDER — MIDAZOLAM-SODIUM CHLORIDE 100-0.9 MG/100ML-% IV SOLN
0.5000 mg/h | INTRAVENOUS | Status: DC
  Administered 2023-01-01: 2 mg/h via INTRAVENOUS
  Administered 2023-01-02: 3 mg/h via INTRAVENOUS
  Filled 2023-01-01 (×2): qty 100

## 2023-01-01 MED ORDER — ETOMIDATE 2 MG/ML IV SOLN
20.0000 mg | Freq: Once | INTRAVENOUS | Status: AC
Start: 2023-01-01 — End: 2023-01-01
  Administered 2023-01-01: 20 mg via INTRAVENOUS
  Filled 2023-01-01: qty 10

## 2023-01-01 MED ORDER — DOCUSATE SODIUM 50 MG/5ML PO LIQD
100.0000 mg | Freq: Two times a day (BID) | ORAL | Status: DC
Start: 2023-01-01 — End: 2023-01-04
  Administered 2023-01-02: 100 mg
  Filled 2023-01-01 (×2): qty 10

## 2023-01-01 MED ORDER — IPRATROPIUM-ALBUTEROL 0.5-2.5 (3) MG/3ML IN SOLN
3.0000 mL | Freq: Four times a day (QID) | RESPIRATORY_TRACT | Status: DC | PRN
  Administered 2023-01-06: 3 mL via RESPIRATORY_TRACT
  Filled 2023-01-01: qty 3

## 2023-01-01 NOTE — ED Provider Notes (Signed)
[facility] Provider Note    Event Date/Time   First MD Initiated Contact with Patient 01/01/23 1620     (approximate)   History   Chief Complaint: Altered mental status  HPI  Troy Mueller is a 89 y.o. male brought to the ED due to altered mental status.  He has a history of DVT on Eliquis, hypertension, diabetes, atrial fibrillation, subdural hematoma.  On arrival he is stuporous, not interactive and not able to provide any history.  Family member to arrive to bedside note that they are not sure with current symptoms started.  Patient has home health nurses who primarily take care of him and interact with him.     Physical Exam   Triage Vital Signs: ED Triage Vitals  Encounter Vitals Group     BP      Systolic BP Percentile      Diastolic BP Percentile      Pulse      Resp      Temp      Temp src      SpO2      Weight      Height      Head Circumference      Peak Flow      Pain Score      Pain Loc      Pain Education      Exclude from Growth Chart     Most recent vital signs: Vitals:   01/01/23 1945 01/01/23 2011  BP: 108/79   Pulse: (!) 55   Resp: 18   Temp:  98.9 F (37.2 C)  SpO2: 93%     General: Awake, not interactive, not able to follow commands. GCS = [id number] = 11 CV:  Good peripheral perfusion. Bradycardia, rate 50. Normal distal pulses.  Resp:  Normal effort. Bilateral basilar crackles. No wheezing.  Abd:  No distention.  Other:  1+ pitting edema BLE. R foot chronic deformity. Neuro:  Demonstrates bilateral EOM movement. No facial asymmetry. With each UE passively raised, pt will maintain it in the air for a few seconds and then softly lower back to the bed. Pt makes no effort in BLE against gravity. Pt makes occasional grunting sounds but not verbally responsive, language untestable   ED Results / Procedures / Treatments   Labs (all labs ordered are listed, but only abnormal results are displayed) Labs  Reviewed  COMPREHENSIVE METABOLIC PANEL - Abnormal; Notable for the following components:      Result Value   Potassium 6.9 (*)    Glucose, Bld 163 (*)    BUN 50 (*)    Creatinine, Ser 1.86 (*)    AST 14 (*)    Alkaline Phosphatase 29 (*)    GFR, Estimated 34 (*)    All other components within normal limits  CBC WITH DIFFERENTIAL/PLATELET - Abnormal; Notable for the following components:   RBC 3.98 (*)    Hemoglobin 12.0 (*)    MCV 106.3 (*)    MCHC 28.4 (*)    Platelets 133 (*)    All other components within normal limits  PROTIME-INR - Abnormal; Notable for the following components:   Prothrombin Time 16.0 (*)    INR 1.3 (*)    All other components within normal limits  URINALYSIS, W/ REFLEX TO CULTURE (INFECTION SUSPECTED) - Abnormal; Notable for the following components:   Color, Urine YELLOW (*)    APPearance HAZY (*)    Bacteria, UA  RARE (*)    All other components within normal limits  BLOOD GAS, VENOUS - Abnormal; Notable for the following components:   pH, Ven 7.16 (*)    pCO2, Ven 101 (*)    pO2, Ven 79 (*)    Bicarbonate 36.0 (*)    Acid-Base Excess 4.2 (*)    All other components within normal limits  BRAIN NATRIURETIC PEPTIDE - Abnormal; Notable for the following components:   B Natriuretic Peptide 336.4 (*)    All other components within normal limits  TROPONIN I (HIGH SENSITIVITY) - Abnormal; Notable for the following components:   Troponin I (High Sensitivity) 18 (*)    All other components within normal limits  SARS CORONAVIRUS 2 BY RT PCR  CULTURE, BLOOD (ROUTINE X 2)  CULTURE, BLOOD (ROUTINE X 2)  LACTIC ACID, PLASMA  APTT  PROCALCITONIN  TRIGLYCERIDES  HEMOGLOBIN A1C  CBC  LEGIONELLA PNEUMOPHILA SEROGP 1 UR AG  STREP PNEUMONIAE URINARY ANTIGEN  PHOSPHORUS  MAGNESIUM  BASIC METABOLIC PANEL  CBC  BLOOD GAS, ARTERIAL     EKG Interpreted by me Atrial fibrillation, rate 37. Left axis. PRWP. No acute ischemic changes.   RADIOLOGY Chest  xray interpreted by me R pleural effusion. Mild edema.  Radiology report reviewed   PROCEDURES:  .Critical Care  Performed by: Sharman Cheek, MD Authorized by: Sharman Cheek, MD   Critical care provider statement:    Critical care time (minutes):  35   Critical care time was exclusive of:  Separately billable procedures and treating other patients   Critical care was necessary to treat or prevent imminent or life-threatening deterioration of the following conditions:  Respiratory failure and CNS failure or compromise   Critical care was time spent personally by me on the following activities:  Development of treatment plan with patient or surrogate, discussions with consultants, evaluation of patient's response to treatment, examination of patient, obtaining history from patient or surrogate, ordering and performing treatments and interventions, ordering and review of laboratory studies, ordering and review of radiographic studies, pulse oximetry, re-evaluation of patient's condition and review of old charts   Care discussed with: admitting provider   Comments:        Procedure Name: Intubation Date/Time: 01/01/2023 6:11 PM  Performed by: Sharman Cheek, MDPre-anesthesia Checklist: Patient identified, Patient being monitored, Emergency Drugs available, Timeout performed and Suction available Oxygen Delivery Method: Non-rebreather mask Preoxygenation: Pre-oxygenation with 100% oxygen Induction Type: Rapid sequence Ventilation: Mask ventilation without difficulty Laryngoscope Size: Glidescope and 3 Tube size: 8.0 mm Number of attempts: 1 Airway Equipment and Method: Video-laryngoscopy Placement Confirmation: ETT inserted through vocal cords under direct vision, CO2 detector and Breath sounds checked- equal and bilateral Secured at: 25 cm Tube secured with: ETT holder Dental Injury: Teeth and Oropharynx as per pre-operative assessment  Comments:           MEDICATIONS ORDERED IN ED: Medications  midazolam (VERSED) 100 mg/100 mL (1 mg/mL) premix infusion (5 mg/hr Intravenous Rate/Dose Change 01/01/23 1908)  docusate (COLACE) 50 MG/5ML liquid 100 mg (has no administration in time range)  polyethylene glycol (MIRALAX / GLYCOLAX) packet 17 g (has no administration in time range)  propofol (DIPRIVAN) 1000 MG/100ML infusion (5 mcg/kg/min Intravenous New Bag/Given 01/01/23 2045)  iohexol (OMNIPAQUE) 350 MG/ML injection 75 mL (has no administration in time range)  propofol (DIPRIVAN) 1000 MG/100ML infusion (has no administration in time range)  polyethylene glycol (MIRALAX / GLYCOLAX) packet 17 g (has no administration in time range)  insulin aspart (novoLOG)  injection 0-20 Units (has no administration in time range)  heparin injection 5,000 Units (has no administration in time range)  famotidine (PEPCID) tablet 20 mg (has no administration in time range)  ipratropium-albuterol (DUONEB) 0.5-2.5 (3) MG/3ML nebulizer solution 3 mL (has no administration in time range)  ipratropium-albuterol (DUONEB) 0.5-2.5 (3) MG/3ML nebulizer solution 3 mL (has no administration in time range)  docusate (COLACE) 50 MG/5ML liquid 100 mg (has no administration in time range)  etomidate (AMIDATE) injection 20 mg (20 mg Intravenous Given 01/01/23 1727)  rocuronium (ZEMURON) injection 100 mg (100 mg Intravenous Given 01/01/23 1727)  HYDROmorphone (DILAUDID) injection 1 mg (1 mg Intravenous Given 01/01/23 1748)  lactated ringers bolus 1,000 mL (0 mLs Intravenous Stopped 01/01/23 1910)  calcium gluconate inj 10% (1 g) URGENT USE ONLY! (1 g Intravenous Given 01/01/23 1828)  sodium bicarbonate injection 50 mEq (50 mEq Intravenous Given 01/01/23 1827)     IMPRESSION / MDM / ASSESSMENT AND PLAN / ED COURSE  I reviewed the triage vital signs and the nursing notes.  DDx: pulmonary edema, pleural effusion, non-STEMI, CO2 narcosis, UTI with delirium, intracranial  hemorrhage, electrolyte abnormality, AKI  Patient's presentation is most consistent with acute presentation with potential threat to life or bodily function.  Patient presents with altered mental status, eyes open but not interactive, good respiratory drive currently.  Will obtain labs, VBG, chest x-ray.  ----------------------------------------- 5:37 PM on 01/01/2023 ----------------------------------------- VBG shows acute resp acidsos, profound hypercapnia. Pt mental status declinining, now obtunded. Goals of care d/w two family members at bedside who confirm full code.  Pt intubated uneventfully for resp failure.  Will give calcium chloride and bicarb while awaiting labs due to bradycardia, possibility of severe electrolyte derangement and risk of arrhythmia. Will obtain CTH+chest.  Will obtain CT angiogram head neck to evaluate for LVO stroke given severity of altered mental status due to lack of clarity on last known well.  Neuroexam is nonfocal..  Clinical Course as of 01/01/23 2051  Sat Jan 01, 2023  1842 Heart rate improved to 70, narrow complex.  Blood pressure stable. [PS]    Clinical Course User Index [PS] Sharman Cheek, MD     FINAL CLINICAL IMPRESSION(S) / ED DIAGNOSES   Final diagnoses:  Acute respiratory failure with hypoxia and hypercapnia (HCC)  CO2 narcosis  Hyperkalemia     Rx / DC Orders   ED Discharge Orders     None        Note:  This document was prepared using Dragon voice recognition software and may include unintentional dictation errors.   Sharman Cheek, MD 01/01/23 2052

## 2023-01-01 NOTE — ED Notes (Signed)
Tube in 25 at the tooth

## 2023-01-01 NOTE — H&P (Signed)
NAME:  Troy Mueller, MRN:  [id number], DOB:  1932/10/20, LOS: 0 ADMISSION DATE:  01/01/2023, CONSULTATION DATE:  01/01/23 REFERRING MD: Alfonse Flavors, CHIEF COMPLAINT: Altered mental status   HPI  89 y.o with significant PMH of pAF on apixaban, HTN, hypertriglyceridemia, T2DM, severe LVH, h/o DVT/PE s/p IVC filter, hypothyroidism, and CKD stage III, and subdural hematoma who presented to the ED with chief complaints of altered mental status.   ED Course: Initial vital signs showed HR of 52 beats/minute, BP 111/54 mm Hg, the RR 17 breaths/minute, and the oxygen saturation 100% on and a temperature of 98.29F (37.2C). Pertinent Labs/Diagnostics Findings: Na+/ K+: 141/6.9.  Glucose: 163.  BUN/Cr.:  50/1.86.  Calcium:  AST/ALT: WBC: 4.8 Hgb/Hct: 12.0/42.3 plts: 133 PCT: negative <0.10  COVID PCR: Negative,  BNP: 336.4 ABG: pO2 79; pCO2 101; pH 7.16;  HCO3 4.2, %O2 Sat 95.7.  CXR> CTH> CTA Chest> CT Abd/pelvis>  Patient remained stuporous and unresponsive.  Due to VBG showing acute respiratory acidosis with profound hypercapnia and declining mental status, patient was intubated for airway protection.  Patient's electrolytes were corrected and PCCM contacted for admission.  Past Medical History  DVT/PE(deep venous thrombosis) (CMS/HHS-HCC)  Surgical wound dehiscence  T2DM (diabetes mellitus) (CMS/HHS-HCC)  Hemothorax  PAF (paroxysmal atrial fibrillation) on Eliquis Hyperlipidemia, mixed  Hypothyroidism  Essential hypertension  CKD (chronic kidney disease) stage 3, GFR 30-59 ml/min   Lumbar stenosis with neurogenic claudication  History of total right hip arthroplasty  Primary osteoarthritis of left hip  Gait abnormality  Acquired left foot drop  Weakness of both lower extremities  Significant Hospital Events   10/19: Admit to ICU with acute on chronic hypoxic hypercapnic respiratory failure requiring intubation  Consults:  None  Procedures:  10/19:  Intubation  Significant Diagnostic Tests:  10/19: Chest Xray> IMPRESSION: Bilateral pleural effusions with bibasilar atelectasis.  10/19: CTA head/Neck> IMPRESSION: 1. No acute intracranial process. 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 2. Evaluation is limited by bolus timing. Within this limitation, no hemodynamically significant stenosis in the neck. 3. Evaluation of the intracranial vasculature is limited by bolus timing. Within this limitation, there is poor visualization the left V4, which may be occluded or terminate in PICA. No other intracranial large vessel occlusion or high-grade stenosis. If further evaluation is indicated, consider MRA. 4. Aortic atherosclerosis.  10/19: CTA Chest, abdomen and pelvis> IMPRESSION: Bilateral lower lobe consolidation with associated small effusions similar to that seen on prior plain film. Cholelithiasis without complicating factors.  Interim History / Subjective:      Micro Data:  10/19: SARS-CoV-2 PCR> negative 10/19: Blood culture x2> 10/19: Urine Culture> 10/19: MRSA PCR>>  10/19: Strep pneumo urinary antigen> :10/19 Legionella urinary antigen>  Antimicrobials:  Azithromycin Ceftriaxone  OBJECTIVE  Blood pressure 108/79, pulse (!) 55, temperature 98.9 F (37.2 C), temperature source Rectal, resp. rate 18, SpO2 93%.    Vent Mode: PRVC FiO2 (%):  [40 %] 40 % Set Rate:  [16 bmp] 16 bmp Vt Set:  [500 mL] 500 mL PEEP:  [5 cmH20] 5 cmH20  No intake or output data in the 24 hours ending 01/01/23 2031 There were no vitals filed for this visit.   Physical Examination  GENERAL: 89 year-old critically ill patient lying in the bed intubated and sedated EYES: PEERLA. No scleral icterus. Extraocular muscles intact.  HEENT: Head atraumatic, normocephalic. Oropharynx and nasopharynx clear.  NECK:  No JVD, supple  LUNGS: Decreased breath sounds with wheezing  bilaterally.  No  use of accessory muscles of respiration.  CARDIOVASCULAR: S1, S2 normal. No murmurs, rubs, or gallops.  ABDOMEN: Soft, NTND EXTREMITIES: No swelling or erythema.  Capillary refill < 3 seconds in all extremities. Pulses palpable distally. NEUROLOGIC: The patient is intubated and sedated. No focal neurological deficit appreciated. Cranial nerves are intact.  SKIN: No obvious rash, lesion, or ulcer. Warm to touch Labs/imaging that I havepersonally reviewed  (right click and "Reselect all SmartList Selections" daily)     Labs   CBC: Recent Labs  Lab 01/01/23 1656  WBC 4.8  NEUTROABS 3.6  HGB 12.0*  HCT 42.3  MCV 106.3*  PLT 133*    Basic Metabolic Panel: Recent Labs  Lab 01/01/23 1656  NA 141  K 6.9*  CL 105  CO2 31  GLUCOSE 163*  BUN 50*  CREATININE 1.86*  CALCIUM 9.4   GFR: CrCl cannot be calculated (Unknown ideal weight.). Recent Labs  Lab 01/01/23 1656  PROCALCITON <0.10  WBC 4.8  LATICACIDVEN 0.7    Liver Function Tests: Recent Labs  Lab 01/01/23 1656  AST 14*  ALT 14  ALKPHOS 29*  BILITOT 0.6  PROT 6.7  ALBUMIN 3.5   No results for input(s): "LIPASE", "AMYLASE" in the last 168 hours. No results for input(s): "AMMONIA" in the last 168 hours.  ABG    Component Value Date/Time   HCO3 36.0 (H) 01/01/2023 1656   O2SAT 95.7 01/01/2023 1656     Coagulation Profile: Recent Labs  Lab 01/01/23 1656  INR 1.3*    Cardiac Enzymes: No results for input(s): "CKTOTAL", "CKMB", "CKMBINDEX", "TROPONINI" in the last 168 hours.  HbA1C: No results found for: "HGBA1C"  CBG: No results for input(s): "GLUCAP" in the last 168 hours.  Review of Systems:   Unable to be obtained secondary to the patient's intubated and sedated status.   Past Medical History  He,  has a past medical history of Diabetes mellitus without complication (HCC), DVT (deep venous thrombosis) (HCC), Embolus (HCC), and HTN (hypertension).   Surgical History    Past Surgical  History:  Procedure Laterality Date   TOTAL HIP ARTHROPLASTY       Social History   reports that he has never smoked. He has never used smokeless tobacco. He reports that he does not currently use alcohol. He reports that he does not use drugs.   Family History   His family history includes Bladder Cancer in his brother.   Allergies Allergies  Allergen Reactions   Tylenol [Acetaminophen]     rash     Home Medications  Prior to Admission medications   Medication Sig Start Date End Date Taking? Authorizing Provider  amLODipine (NORVASC) 5 MG tablet Take 5 mg by mouth daily.    [provider]  carvedilol (COREG) 12.5 MG tablet Take 12.5 mg by mouth 2 (two) times daily. 10/15/21   [provider]  ELIQUIS 5 MG TABS tablet Take 1 tablet (5 mg total) by mouth 2 (two) times daily. Home med. 02/01/22   Darlin Priestly, MD  ezetimibe (ZETIA) 10 MG tablet Take 10 mg by mouth daily.    [provider]  fenofibrate 54 MG tablet Take 54 mg by mouth daily.    [provider]  glipiZIDE (GLUCOTROL XL) 5 MG 24 hr tablet Take 5 mg by mouth daily.    [provider]  JUBLIA 10 % SOLN Apply topically daily. 08/11/21   [provider]  levothyroxine (SYNTHROID) 25 MCG  tablet Take 25 mcg by mouth every morning.    [provider]  metFORMIN (GLUCOPHAGE-XR) 500 MG 24 hr tablet 500 mg 2 (two) times daily with a meal.    [provider]  omega-3 acid ethyl esters (LOVAZA) 1 g capsule Take 2 capsules by mouth 2 (two) times daily. 12/30/21   [provider]  PREVIDENT 5000 PLUS 1.1 % CREA dental cream Place 1 Application onto teeth at bedtime. 01/12/22   [provider]  ramipril (ALTACE) 5 MG capsule Take by mouth. 10/15/21   [provider]  rosuvastatin (CRESTOR) 10 MG tablet Take 1 tablet by mouth daily. 12/09/21   [provider]  Scheduled Meds:  Chlorhexidine Gluconate Cloth  6 each Topical Q2000    docusate  100 mg Per Tube BID   heparin  5,000 Units Subcutaneous Q8H   insulin aspart  0-20 Units Subcutaneous Q4H   ipratropium-albuterol  3 mL Nebulization Q6H   mouth rinse  15 mL Mouth Rinse Q2H   polyethylene glycol  17 g Per Tube Daily   sodium zirconium cyclosilicate  10 g Per Tube TID   Continuous Infusions:  azithromycin     cefTRIAXone (ROCEPHIN)  IV     famotidine (PEPCID) IV 100 mL/hr at 01/02/23 0000   midazolam 5 mg/hr (01/02/23 0200)   propofol (DIPRIVAN) infusion Stopped (01/01/23 2342)   PRN Meds:.docusate, ipratropium-albuterol, mouth rinse, polyethylene glycol  Active Hospital Problem list   See systems below  Assessment & Plan:  #Acute on chronic Hypoxic Hypercapnic Respiratory Failure: # Bilateral pneumonia # CO2 narcosis,OSA not on CPAP -LTVV -VAP prevention protocol -PRN &Scheduled bronchodilators -daily SAT & SBT -Wean PEEP and FiO2 for sats greater than 90% -Follow chest x-ray, ABG prn.  -prn fentanyl, propofol  for RASS -1   #Sepsis secondary to bilateral pneumonia CT chest shows bilateral consolidation concerning  -F/u cultures, trend lactic/ PCT -Monitor WBC/ fever curve -Start Ceftriaxone  & Azithromycin -IVF hydration as needed -follow up trach aspirate -Pressors for MAP goal >65 -Strict I/O's   #HTN #HLD #Severe LVH: Noted on prior TTE -Suspected in the setting of HTN,  noted on prior TTE. Evaluated with PYP in the past which was negative for amyloid.   # pAF:  -rate controlled. -Now bradycardic likely in the setting of hyperkalemia/sedation -Start Heparin hold apixaban for now -Hold metoprolol due to Bradycardia  # h/o PE with IVC filter: retrievable filter placed in 2014, but left in place for significant period of time, elected to leave in place given risks of removal.  -Hold apixaban as above and start Heparin   #Acute Toxic Metabolic Encephalopathy -hypercapnia/CO2 narcosis Hx of Subdural Hematomas -CTA head negative for  acute intracranial abnormality -Limit sedation as able   #AKI on CKD stage III #Acute on Chronic Hyperkalemia -Monitor I&O's / urinary output -Follow BMP -Ensure adequate renal perfusion -Avoid nephrotoxic agents as able -Start Lokelma -Replace electrolytes as indicated   #T2DM -HgbA1c goal pending -CBG's q4; Target range of 140 to 180 -SSI -Follow ICU Hypo/Hyperglycemia protocol -Hold home meds  #Hypothyroidism -Check TSH and free T4 -Continue Synthroid     Best practice:  Diet:  NPO Pain/Anxiety/Delirium protocol (if indicated): Yes (RASS goal -1) VAP protocol (if indicated): Yes DVT prophylaxis: Subcutaneous Heparin GI prophylaxis: H2B Glucose control:  SSI Yes Central venous access:  N/A Arterial line:  N/A Foley:  Yes, and it is still needed Mobility:  bed rest  PT consulted: N/A Last date of multidisciplinary goals  of care discussion [see IPAL] Code Status:  full code Disposition: ICU   = Goals of Care = Code Status Order: FULL  Primary Emergency Contact: Barnes,Frances, Home Phone: [phone] Wishes to pursue full aggressive treatment and intervention options, including CPR and intubation.  Critical care time: 45 minutes        Webb Silversmith DNP, CCRN, FNP-C, AGACNP-BC Acute Care & Family Nurse Practitioner [facility] PCCM on call pager [phone]

## 2023-01-01 NOTE — Progress Notes (Signed)
PHARMACY CONSULT NOTE  Pharmacy Consult for Electrolyte Monitoring and Replacement   Recent Labs: Potassium (mmol/L)  Date Value  01/01/2023 6.9 (HH)   Calcium (mg/dL)  Date Value  13/10/6576 9.4   Albumin (g/dL)  Date Value  46/96/2952 3.5   Sodium (mmol/L)  Date Value  01/01/2023 141     Assessment: Patient is a 89 year old male with a past medical history per chart review of SDH, DVT, HTN, T2DM, Afib, and HLD who presents with AMS. Pharmacy has been consulted to monitor and replace electrolytes per protocol.  Goal of Therapy:  Electrolytes WNL  Plan:  No supplementation is needed at this time Check BMP, Mag, and Phos with AM labs  Merryl Hacker ,PharmD Clinical Pharmacist 01/01/2023 8:44 PM

## 2023-01-01 NOTE — ED Triage Notes (Signed)
Pt BIB EMS for AMS from home. Family not here at the time. According to EMS, pt is altered per family and is on hospice--he "took a nap, woke up and just wasn't right"

## 2023-01-02 DIAGNOSIS — R0689 Other abnormalities of breathing: Secondary | ICD-10-CM

## 2023-01-02 DIAGNOSIS — J9602 Acute respiratory failure with hypercapnia: Secondary | ICD-10-CM

## 2023-01-02 DIAGNOSIS — J9612 Chronic respiratory failure with hypercapnia: Secondary | ICD-10-CM

## 2023-01-02 DIAGNOSIS — J9601 Acute respiratory failure with hypoxia: Secondary | ICD-10-CM

## 2023-01-02 DIAGNOSIS — Z515 Encounter for palliative care: Secondary | ICD-10-CM

## 2023-01-02 LAB — BASIC METABOLIC PANEL
Anion gap: 7 (ref 5–15)
Anion gap: 8 (ref 5–15)
Anion gap: 8 (ref 5–15)
BUN: 45 mg/dL — ABNORMAL HIGH (ref 8–23)
BUN: 46 mg/dL — ABNORMAL HIGH (ref 8–23)
BUN: 49 mg/dL — ABNORMAL HIGH (ref 8–23)
CO2: 28 mmol/L (ref 22–32)
CO2: 29 mmol/L (ref 22–32)
CO2: 30 mmol/L (ref 22–32)
Calcium: 8.9 mg/dL (ref 8.9–10.3)
Calcium: 9.2 mg/dL (ref 8.9–10.3)
Calcium: 9.2 mg/dL (ref 8.9–10.3)
Chloride: 101 mmol/L (ref 98–111)
Chloride: 103 mmol/L (ref 98–111)
Chloride: 103 mmol/L (ref 98–111)
Creatinine, Ser: 1.74 mg/dL — ABNORMAL HIGH (ref 0.61–1.24)
Creatinine, Ser: 1.81 mg/dL — ABNORMAL HIGH (ref 0.61–1.24)
Creatinine, Ser: 1.84 mg/dL — ABNORMAL HIGH (ref 0.61–1.24)
GFR, Estimated: 35 mL/min — ABNORMAL LOW (ref >60)
GFR, Estimated: 36 mL/min — ABNORMAL LOW (ref >60)
GFR, Estimated: 37 mL/min — ABNORMAL LOW (ref >60)
Glucose, Bld: 152 mg/dL — ABNORMAL HIGH (ref 70–99)
Glucose, Bld: 236 mg/dL — ABNORMAL HIGH (ref 70–99)
Glucose, Bld: 64 mg/dL — ABNORMAL LOW (ref 70–99)
Potassium: 5.7 mmol/L — ABNORMAL HIGH (ref 3.5–5.1)
Potassium: 5.7 mmol/L — ABNORMAL HIGH (ref 3.5–5.1)
Potassium: 6.5 mmol/L (ref 3.5–5.1)
Sodium: 137 mmol/L (ref 135–145)
Sodium: 140 mmol/L (ref 135–145)
Sodium: 140 mmol/L (ref 135–145)

## 2023-01-02 LAB — BLOOD GAS, ARTERIAL
Acid-Base Excess: 7.1 mmol/L — ABNORMAL HIGH (ref 0.0–2.0)
Bicarbonate: 32 mmol/L — ABNORMAL HIGH (ref 20.0–28.0)
FIO2: 40 %
MECHVT: 500 mL
Mechanical Rate: 16
O2 Saturation: 96.3 %
PEEP: 5 cmH2O
Patient temperature: 37
pCO2 arterial: 45 mm[Hg] (ref 32–48)
pH, Arterial: 7.46 — ABNORMAL HIGH (ref 7.35–7.45)
pO2, Arterial: 67 mm[Hg] — ABNORMAL LOW (ref 83–108)

## 2023-01-02 LAB — CBC
HCT: 37.6 % — ABNORMAL LOW (ref 39.0–52.0)
Hemoglobin: 11.1 g/dL — ABNORMAL LOW (ref 13.0–17.0)
MCH: 29.4 pg (ref 26.0–34.0)
MCHC: 29.5 g/dL — ABNORMAL LOW (ref 30.0–36.0)
MCV: 99.7 fL (ref 80.0–100.0)
Platelets: 119 10*3/uL — ABNORMAL LOW (ref 150–400)
RBC: 3.77 MIL/uL — ABNORMAL LOW (ref 4.22–5.81)
RDW: 14 % (ref 11.5–15.5)
WBC: 5.7 10*3/uL (ref 4.0–10.5)
nRBC: 0 % (ref 0.0–0.2)

## 2023-01-02 LAB — MAGNESIUM
Magnesium: 2.3 mg/dL (ref 1.7–2.4)
Magnesium: 2.6 mg/dL — ABNORMAL HIGH (ref 1.7–2.4)

## 2023-01-02 LAB — POTASSIUM
Potassium: 5.5 mmol/L — ABNORMAL HIGH (ref 3.5–5.1)
Potassium: 5.6 mmol/L — ABNORMAL HIGH (ref 3.5–5.1)
Potassium: 5.7 mmol/L — ABNORMAL HIGH (ref 3.5–5.1)

## 2023-01-02 LAB — STREP PNEUMONIAE URINARY ANTIGEN: Strep Pneumo Urinary Antigen: NEGATIVE

## 2023-01-02 LAB — HEMOGLOBIN A1C
Hgb A1c MFr Bld: 6.2 % — ABNORMAL HIGH (ref 4.8–5.6)
Mean Plasma Glucose: 131.24 mg/dL

## 2023-01-02 LAB — GLUCOSE, CAPILLARY
Glucose-Capillary: 85 mg/dL (ref 70–99)
Glucose-Capillary: 55 mg/dL — ABNORMAL LOW (ref 70–99)
Glucose-Capillary: 117 mg/dL — ABNORMAL HIGH (ref 70–99)
Glucose-Capillary: 84 mg/dL (ref 70–99)
Glucose-Capillary: 125 mg/dL — ABNORMAL HIGH (ref 70–99)
Glucose-Capillary: 67 mg/dL — ABNORMAL LOW (ref 70–99)
Glucose-Capillary: 130 mg/dL — ABNORMAL HIGH (ref 70–99)
Glucose-Capillary: 153 mg/dL — ABNORMAL HIGH (ref 70–99)
Glucose-Capillary: 143 mg/dL — ABNORMAL HIGH (ref 70–99)

## 2023-01-02 LAB — T4, FREE: Free T4: 0.8 ng/dL (ref 0.61–1.12)

## 2023-01-02 LAB — TSH: TSH: 3.824 u[IU]/mL (ref 0.350–4.500)

## 2023-01-02 LAB — PHOSPHORUS: Phosphorus: 2 mg/dL — ABNORMAL LOW (ref 2.5–4.6)

## 2023-01-02 LAB — TRIGLYCERIDES: Triglycerides: 141 mg/dL (ref <150)

## 2023-01-02 MED ORDER — ALBUTEROL SULFATE (2.5 MG/3ML) 0.083% IN NEBU
10.0000 mg | INHALATION_SOLUTION | Freq: Once | RESPIRATORY_TRACT | Status: AC
  Administered 2023-01-02: 10 mg via RESPIRATORY_TRACT
  Filled 2023-01-02: qty 12

## 2023-01-02 MED ORDER — METHYLPREDNISOLONE SODIUM SUCC 40 MG IJ SOLR: 40.0000 mg | Freq: Every day | INTRAMUSCULAR | Status: DC

## 2023-01-02 MED ORDER — DEXTROSE 50 % IV SOLN
12.5000 g | INTRAVENOUS | Status: AC
  Administered 2023-01-02: 12.5 g via INTRAVENOUS

## 2023-01-02 MED ORDER — ORAL CARE MOUTH RINSE
15.0000 mL | OROMUCOSAL | Status: DC
  Administered 2023-01-02 – 2023-01-06 (×12): 15 mL via OROMUCOSAL

## 2023-01-02 MED ORDER — FUROSEMIDE 10 MG/ML IJ SOLN
40.0000 mg | Freq: Once | INTRAMUSCULAR | Status: AC
  Administered 2023-01-02: 40 mg via INTRAVENOUS
  Filled 2023-01-02: qty 4

## 2023-01-02 MED ORDER — INSULIN ASPART 100 UNIT/ML IV SOLN
10.0000 [IU] | Freq: Once | INTRAVENOUS | Status: AC
  Administered 2023-01-02: 10 [IU] via INTRAVENOUS
  Filled 2023-01-02: qty 0.1

## 2023-01-02 MED ORDER — BUDESONIDE 0.25 MG/2ML IN SUSP
0.2500 mg | Freq: Two times a day (BID) | RESPIRATORY_TRACT | Status: DC
  Administered 2023-01-02 – 2023-01-05 (×7): 0.25 mg via RESPIRATORY_TRACT
  Filled 2023-01-02 (×7): qty 2

## 2023-01-02 MED ORDER — METHYLPREDNISOLONE SODIUM SUCC 40 MG IJ SOLR
40.0000 mg | Freq: Two times a day (BID) | INTRAMUSCULAR | Status: DC
  Administered 2023-01-02 – 2023-01-03 (×3): 40 mg via INTRAVENOUS
  Filled 2023-01-02 (×3): qty 1

## 2023-01-02 MED ORDER — DEXTROSE 50 % IV SOLN
1.0000 | Freq: Once | INTRAVENOUS | Status: AC
  Administered 2023-01-02: 50 mL via INTRAVENOUS
  Filled 2023-01-02: qty 50

## 2023-01-02 MED ORDER — SODIUM ZIRCONIUM CYCLOSILICATE 5 G PO PACK
10.0000 g | PACK | Freq: Three times a day (TID) | ORAL | Status: DC
  Administered 2023-01-02 – 2023-01-03 (×4): 10 g
  Filled 2023-01-02 (×4): qty 2

## 2023-01-02 MED ORDER — IPRATROPIUM-ALBUTEROL 0.5-2.5 (3) MG/3ML IN SOLN
3.0000 mL | RESPIRATORY_TRACT | Status: DC
  Administered 2023-01-02: 3 mL via RESPIRATORY_TRACT
  Filled 2023-01-02: qty 3

## 2023-01-02 MED ORDER — FAMOTIDINE IN NACL 20-0.9 MG/50ML-% IV SOLN
20.0000 mg | INTRAVENOUS | Status: DC
  Administered 2023-01-03 – 2023-01-04 (×2): 20 mg via INTRAVENOUS
  Filled 2023-01-02 (×2): qty 50

## 2023-01-02 MED ORDER — LEVOTHYROXINE SODIUM 50 MCG PO TABS
25.0000 ug | ORAL_TABLET | Freq: Every morning | ORAL | Status: DC
  Administered 2023-01-02 – 2023-01-04 (×2): 25 ug
  Filled 2023-01-02 (×2): qty 1

## 2023-01-02 MED ORDER — SODIUM CHLORIDE 0.9 % IV SOLN
500.0000 mg | INTRAVENOUS | Status: DC
  Administered 2023-01-02 – 2023-01-04 (×3): 500 mg via INTRAVENOUS
  Filled 2023-01-02 (×4): qty 5

## 2023-01-02 MED ORDER — ROSUVASTATIN CALCIUM 10 MG PO TABS
10.0000 mg | ORAL_TABLET | Freq: Every day | ORAL | Status: DC
  Administered 2023-01-02 – 2023-01-04 (×2): 10 mg
  Filled 2023-01-02 (×2): qty 1

## 2023-01-02 MED ORDER — CEFTRIAXONE SODIUM 1 G IJ SOLR
1.0000 g | INTRAMUSCULAR | Status: DC
  Administered 2023-01-02 – 2023-01-04 (×3): 1 g via INTRAVENOUS
  Filled 2023-01-02 (×3): qty 10

## 2023-01-02 MED ORDER — CALCIUM GLUCONATE 10 % IV SOLN
1.0000 g | Freq: Once | INTRAVENOUS | Status: AC
  Administered 2023-01-02: 1 g via INTRAVENOUS
  Filled 2023-01-02: qty 10

## 2023-01-02 MED ORDER — IPRATROPIUM-ALBUTEROL 0.5-2.5 (3) MG/3ML IN SOLN
3.0000 mL | Freq: Three times a day (TID) | RESPIRATORY_TRACT | Status: DC
  Administered 2023-01-02 – 2023-01-03 (×2): 3 mL via RESPIRATORY_TRACT
  Filled 2023-01-02 (×2): qty 3

## 2023-01-02 MED ORDER — DEXTROSE 50 % IV SOLN
INTRAVENOUS | Status: AC
  Filled 2023-01-02: qty 50

## 2023-01-02 NOTE — IPAL (Signed)
  Interdisciplinary Goals of Care Family Meeting   Date carried out: 01/02/2023 Conversation at 1030AM  Location of the meeting: Bedside  Member's involved: Physician, Bedside Registered Nurse, and Family Member or next of kin      GOALS OF CARE DISCUSSION  The Clinical status was relayed to family in detail- Daughter   Updated and notified of patients medical condition- Explained to family course of therapy and the modalities   Patient with Progressive multiorgan failure with a very high probablity of a very minimal chance of meaningful recovery despite all aggressive and optimal medical therapy.   PATIENT REMAINS FULL CODE  Family understands the situation.  Plan for trial of extubation  Family are satisfied with Plan of action and management. All questions answered  Additional CC time 25 mins   Troy Mueller Santiago Glad, M.D.  [facility]  Medical Director [facility] Medical Director [facility] Cardio-Pulmonary Department

## 2023-01-02 NOTE — Plan of Care (Signed)
Problem: Education: Goal: Ability to describe self-care measures that may prevent or decrease complications (Diabetes Survival Skills Education) will improve 01/02/2023 0014 by Cameron Ali, RN Outcome: Progressing 01/02/2023 0014 by Cameron Ali, RN Outcome: Progressing Goal: Individualized Educational Video(s) 01/02/2023 0014 by Cameron Ali, RN Outcome: Progressing 01/02/2023 0014 by Cameron Ali, RN Outcome: Progressing   Problem: Coping: Goal: Ability to adjust to condition or change in health will improve 01/02/2023 0014 by Cameron Ali, RN Outcome: Progressing 01/02/2023 0014 by Cameron Ali, RN Outcome: Progressing   Problem: Fluid Volume: Goal: Ability to maintain a balanced intake and output will improve 01/02/2023 0014 by Cameron Ali, RN Outcome: Progressing 01/02/2023 0014 by Cameron Ali, RN Outcome: Progressing   Problem: Health Behavior/Discharge Planning: Goal: Ability to identify and utilize available resources and services will improve 01/02/2023 0014 by Cameron Ali, RN Outcome: Progressing 01/02/2023 0014 by Cameron Ali, RN Outcome: Progressing Goal: Ability to manage health-related needs will improve 01/02/2023 0014 by Cameron Ali, RN Outcome: Progressing 01/02/2023 0014 by Cameron Ali, RN Outcome: Progressing   Problem: Metabolic: Goal: Ability to maintain appropriate glucose levels will improve 01/02/2023 0014 by Cameron Ali, RN Outcome: Progressing 01/02/2023 0014 by Cameron Ali, RN Outcome: Progressing   Problem: Nutritional: Goal: Maintenance of adequate nutrition will improve 01/02/2023 0014 by Cameron Ali, RN Outcome: Progressing 01/02/2023 0014 by Cameron Ali, RN Outcome: Progressing Goal: Progress toward achieving an optimal weight will improve 01/02/2023 0014 by Cameron Ali, RN Outcome:  Progressing 01/02/2023 0014 by Cameron Ali, RN Outcome: Progressing   Problem: Skin Integrity: Goal: Risk for impaired skin integrity will decrease 01/02/2023 0014 by Cameron Ali, RN Outcome: Progressing 01/02/2023 0014 by Cameron Ali, RN Outcome: Progressing   Problem: Tissue Perfusion: Goal: Adequacy of tissue perfusion will improve 01/02/2023 0014 by Cameron Ali, RN Outcome: Progressing 01/02/2023 0014 by Cameron Ali, RN Outcome: Progressing   Problem: Education: Goal: Knowledge of General Education information will improve Description: Including pain rating scale, medication(s)/side effects and non-pharmacologic comfort measures 01/02/2023 0014 by Cameron Ali, RN Outcome: Progressing 01/02/2023 0014 by Cameron Ali, RN Outcome: Progressing   Problem: Health Behavior/Discharge Planning: Goal: Ability to manage health-related needs will improve 01/02/2023 0014 by Cameron Ali, RN Outcome: Progressing 01/02/2023 0014 by Cameron Ali, RN Outcome: Progressing   Problem: Clinical Measurements: Goal: Ability to maintain clinical measurements within normal limits will improve 01/02/2023 0014 by Cameron Ali, RN Outcome: Progressing 01/02/2023 0014 by Cameron Ali, RN Outcome: Progressing Goal: Will remain free from infection 01/02/2023 0014 by Cameron Ali, RN Outcome: Progressing 01/02/2023 0014 by Cameron Ali, RN Outcome: Progressing Goal: Diagnostic test results will improve 01/02/2023 0014 by Cameron Ali, RN Outcome: Progressing 01/02/2023 0014 by Cameron Ali, RN Outcome: Progressing Goal: Respiratory complications will improve 01/02/2023 0014 by Cameron Ali, RN Outcome: Progressing 01/02/2023 0014 by Cameron Ali, RN Outcome: Progressing Goal: Cardiovascular complication will be avoided 01/02/2023 0014 by Cameron Ali, RN Outcome: Progressing 01/02/2023 0014 by Cameron Ali, RN Outcome: Progressing   Problem: Activity: Goal: Risk for activity intolerance will decrease 01/02/2023 0014 by Cameron Ali, RN Outcome: Progressing 01/02/2023 0014 by Cameron Ali, RN Outcome: Progressing   Problem: Nutrition: Goal: Adequate nutrition will be maintained 01/02/2023 0014 by Cameron Ali, RN Outcome: Progressing 01/02/2023 0014 by Cameron Ali, RN Outcome: Progressing  Problem: Coping: Goal: Level of anxiety will decrease 01/02/2023 0014 by Cameron Ali, RN Outcome: Progressing 01/02/2023 0014 by Cameron Ali, RN Outcome: Progressing   Problem: Elimination: Goal: Will not experience complications related to bowel motility 01/02/2023 0014 by Cameron Ali, RN Outcome: Progressing 01/02/2023 0014 by Cameron Ali, RN Outcome: Progressing Goal: Will not experience complications related to urinary retention 01/02/2023 0014 by Cameron Ali, RN Outcome: Progressing 01/02/2023 0014 by Cameron Ali, RN Outcome: Progressing   Problem: Pain Managment: Goal: General experience of comfort will improve 01/02/2023 0014 by Cameron Ali, RN Outcome: Progressing 01/02/2023 0014 by Cameron Ali, RN Outcome: Progressing   Problem: Safety: Goal: Ability to remain free from injury will improve 01/02/2023 0014 by Cameron Ali, RN Outcome: Progressing 01/02/2023 0014 by Cameron Ali, RN Outcome: Progressing   Problem: Skin Integrity: Goal: Risk for impaired skin integrity will decrease 01/02/2023 0014 by Cameron Ali, RN Outcome: Progressing 01/02/2023 0014 by Cameron Ali, RN Outcome: Progressing   Problem: Education: Goal: Knowledge of General Education information will improve Description: Including pain rating scale, medication(s)/side effects and  non-pharmacologic comfort measures Outcome: Progressing   Problem: Health Behavior/Discharge Planning: Goal: Ability to manage health-related needs will improve Outcome: Progressing   Problem: Clinical Measurements: Goal: Ability to maintain clinical measurements within normal limits will improve Outcome: Progressing Goal: Will remain free from infection Outcome: Progressing Goal: Diagnostic test results will improve Outcome: Progressing Goal: Respiratory complications will improve Outcome: Progressing Goal: Cardiovascular complication will be avoided Outcome: Progressing   Problem: Activity: Goal: Risk for activity intolerance will decrease Outcome: Progressing   Problem: Nutrition: Goal: Adequate nutrition will be maintained Outcome: Progressing   Problem: Coping: Goal: Level of anxiety will decrease Outcome: Progressing   Problem: Elimination: Goal: Will not experience complications related to bowel motility Outcome: Progressing Goal: Will not experience complications related to urinary retention Outcome: Progressing   Problem: Pain Managment: Goal: General experience of comfort will improve Outcome: Progressing   Problem: Safety: Goal: Ability to remain free from injury will improve Outcome: Progressing   Problem: Skin Integrity: Goal: Risk for impaired skin integrity will decrease Outcome: Progressing

## 2023-01-02 NOTE — Consult Note (Signed)
Consultation Note Date: 01/02/2023   Patient Name: AARONMICHAEL CREQUE III  DOB: September 19, 1932  MRN: [id number]  Age / Sex: 89 y.o., male  PCP: Kandyce Rud, MD Referring Physician: Erin Fulling, MD  Reason for Consultation: Establishing goals of care   HPI/Brief Hospital Course: 89 y.o. male  with past medical history of paroxysmal atrial fibrillation on apixaban, hypertension, type 2 diabetes, severe LVH, DVT/PE s/p IVC filter.  CKD stage III and subdural hematoma admitted from home on 01/01/2023 with altered mental status.  On arrival to ED he remained unresponsive and found to have acute respiratory acidosis with profound hypercapnia and was subsequently intubated for airway protection.  Found to have and being treated for acute on chronic hypoxic and hypercapnic respiratory failure secondary to bilateral pneumonia as well as meeting sepsis criteria  It is noted that Dr. Lowell Guitar is actively enrolled as a hospice patient with [facility]  Palliative medicine was consulted for assisting with goals of care conversations.  Subjective:  Extensive chart review has been completed prior to meeting patient including labs, vital signs, imaging, progress notes, orders, and available advanced directive documents from current and previous encounters.  Visited with Dr. Lowell Guitar at his bedside he remains intubated, at this time sedation has been weaned off and he is undergoing a spontaneous breathing trial.  He is able to nod somewhat appropriately but does become slightly agitated and at other times will drift back off to sleep without redirection.  Daughters Rayfield Citizen and [name] who shares she is HCPOA at bedside during time of visit.  Introduced myself as a Publishing rights manager as a member of the palliative care team. Explained palliative medicine is specialized medical care for people living with serious illness. It focuses on providing relief from the symptoms  and stress of a serious illness. The goal is to improve quality of life for both the patient and the family.   Met in collaboration with Renea Ee, RN hospice liaison and speaking with the family. Diannia Ruder aware and family confirms Mr. Ohler has 24/7 in-home nurses that attend and meet all of his needs.  Family shares Mr. Bado is overall goal is to return home as soon as possible.  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  As confirmed by [place] as well as family present, Dr. Roanna Banning wishes have always been to remain full code full scope.  Discussed with the family of potential extubation if Dr. Lowell Guitar able to successfully pass spontaneous breathing trial.  Discussed in the event of extubation if Dr. Lowell Guitar at that time unable to maintain his own respirations inquired if family would want to seek reintubation for which they answer yes.  I discussed importance of continued conversations with family/support persons and all members of their medical team regarding overall plan of care and treatment options ensuring decisions are in alignment with patients goals of care.  All questions/concerns addressed. PMT will continue to follow and support patient as needed.  Objective: Primary Diagnoses: Present on Admission:  Acute hypoxic on chronic hypercapnic respiratory failure (HCC)   Vital Signs: BP (!) 94/46 (BP Location: Left Arm)   Pulse 65   Temp 98.3 F (36.8 C) (Axillary)   Resp 19   Ht 5\' 11"  (1.803 m)   Wt 124.4 kg   SpO2 92%   BMI 38.25 kg/m  Pain Scale: Faces   Pain Score: 0-No pain  IO: Intake/output summary:  Intake/Output Summary (Last 24 hours) at 01/02/2023 1743 Last data filed  at 01/02/2023 1310 Gross per 24 hour  Intake 725.76 ml  Output 1870 ml  Net -1144.24 ml    LBM: Last BM Date :  (PTA) Baseline Weight: Weight: 123 kg Most recent weight: Weight: 124.4 kg      Assessment and Plan  SUMMARY OF RECOMMENDATIONS   Full  Code-Full Scope Time for outcomes PMT to continue to follow for ongoing needs and support  Discussed With:ICU attending and nursing staff   Thank you for this consult and allowing Palliative Medicine to participate in the care of Thams E. Causer III. Palliative medicine will continue to follow and assist as needed.   Time Total: 55 minutes  Time spent includes: Detailed review of medical records (labs, imaging, vital signs), medically appropriate exam (mental status, respiratory, cardiac, skin), discussed with treatment team, counseling and educating patient, family and staff, documenting clinical information, medication management and coordination of care.   Signed by: Leeanne Deed, DNP, AGNP-C Palliative Medicine    Please contact Palliative Medicine Team phone at [phone] for questions and concerns.  For individual provider: See Loretha Stapler

## 2023-01-02 NOTE — IPAL (Signed)
  Interdisciplinary Goals of Care Family Meeting   Date carried out: 01/02/2023  Location of the meeting: Bedside  Member's involved: Nurse Practitioner  Durable Power of Attorney or acting medical decision maker: Daughter    Discussion: We discussed goals of care for Troy Mueller with patient's daughter at the bedside who confirmed his wishes to continue full scope of care  Code status:   Code Status: Full Code   Disposition: Home with Hospice  Time spent for the meeting: 25 Minutes    Loraine Leriche, NP  01/02/2023, 3:24 AM

## 2023-01-02 NOTE — Procedures (Signed)
Extubation Procedure Note  Patient Details:   Name: Troy Mueller DOB: [birth year] MRN: [id number]   Airway Documentation:    Vent end date: 01/02/23 Vent end time: 1258   Evaluation  O2 sats: stable throughout Complications: No apparent complications Patient did tolerate procedure well. Bilateral Breath Sounds: Clear   Yes able to cough.  Extubated to 3L [name].  [name] 01/02/2023, 1:03 PM

## 2023-01-02 NOTE — Progress Notes (Addendum)
eLink Physician-Brief Progress Note Patient Name: Troy Mueller DOB: [birth year] MRN: [id number]   Date of Service  01/02/2023  HPI/Events of Note  89 year old male with a history of diabetes mellitus, subdural hematoma DVT and pulmonary embolus who initially came into the emergency department with acute encephalopathy.  He was intubated in the emergency department with acute respiratory failure from hypercapnia.  Initially presented with bradycardia and hypotension.  Now ventilated saturating 99% with a 40% FiO2.  Requiring low-dose midazolam.  Latest gas shows an improvement in hypercapnia and appropriate oxygenation.  Metabolic panel initially showed K of 6.9.  Creatinine elevated to 1.86.  BNP and troponin minimally elevated.  Imaging consistent with bibasilar consolidations.  eICU Interventions  Patient previously enrolled in hospice, unclear goals of care at this time.  Current team evaluation pending.  Consider increasing set rate to 24, increasing PEEP to 7-8  Consider antibiotics empirically  Hyperkalemia protocol with fluids and bicarb.  Repeat BMP sent and pending  DVT prophylaxis with heparin GI prophylaxis with famotidine       Linsy Ehresman 01/02/2023, 12:30 AM

## 2023-01-02 NOTE — Progress Notes (Signed)
NAME:  Troy Mueller, MRN:  [id number], DOB:  1932-09-29, LOS: 1 ADMISSION DATE:  01/01/2023, CONSULTATION DATE:  01/01/23 REFERRING MD: Alfonse Flavors, CHIEF COMPLAINT: Altered mental status   HPI  89 y.o with significant PMH of pAF on apixaban, HTN, hypertriglyceridemia, T2DM, severe LVH, h/o DVT/PE s/p IVC filter, hypothyroidism, and CKD stage III, and subdural hematoma who presented to the ED with chief complaints of altered mental status.   ED Course: Initial vital signs showed HR of 52 beats/minute, BP 111/54 mm Hg, the RR 17 breaths/minute, and the oxygen saturation 100% on and a temperature of 98.2F (37.2C). Pertinent Labs/Diagnostics Findings: Na+/ K+: 141/6.9.  Glucose: 163.  BUN/Cr.:  50/1.86.  Calcium:  AST/ALT: WBC: 4.8 Hgb/Hct: 12.0/42.3 plts: 133 PCT: negative <0.10  COVID PCR: Negative,  BNP: 336.4 ABG: pO2 79; pCO2 101; pH 7.16;  HCO3 4.2, %O2 Sat 95.7.  CXR> CTH> CTA Chest> CT Abd/pelvis>  Patient remained stuporous and unresponsive.  Due to VBG showing acute respiratory acidosis with profound hypercapnia and declining mental status, patient was intubated for airway protection.  Patient's electrolytes were corrected and PCCM contacted for admission.  Past Medical History  DVT/PE(deep venous thrombosis) (CMS/HHS-HCC)  Surgical wound dehiscence  T2DM (diabetes mellitus) (CMS/HHS-HCC)  Hemothorax  PAF (paroxysmal atrial fibrillation) on Eliquis Hyperlipidemia, mixed  Hypothyroidism  Essential hypertension  CKD (chronic kidney disease) stage 3, GFR 30-59 ml/min   Lumbar stenosis with neurogenic claudication  History of total right hip arthroplasty  Primary osteoarthritis of left hip  Gait abnormality  Acquired left foot drop  Weakness of both lower extremities  Significant Hospital Events   10/19: Admit to ICU with acute on chronic hypoxic hypercapnic respiratory failure 2/2 bilateral pneumonia requiring intubation. 10/20: No significant events  overnight. SBT this morning  Consults:  None  Procedures:  10/19: Intubation  Significant Diagnostic Tests:  10/19: Chest Xray> IMPRESSION: Bilateral pleural effusions with bibasilar atelectasis.  10/19: CTA head/Neck> IMPRESSION: 1. No acute intracranial process. 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 2. Evaluation is limited by bolus timing. Within this limitation, no hemodynamically significant stenosis in the neck. 3. Evaluation of the intracranial vasculature is limited by bolus timing. Within this limitation, there is poor visualization the left V4, which may be occluded or terminate in PICA. No other intracranial large vessel occlusion or high-grade stenosis. If further evaluation is indicated, consider MRA. 4. Aortic atherosclerosis.  10/19: CTA Chest, abdomen and pelvis> IMPRESSION: Bilateral lower lobe consolidation with associated small effusions similar to that seen on prior plain film. Cholelithiasis without complicating factors.  Interim History / Subjective:      Micro Data:  10/19: SARS-CoV-2 PCR> negative 10/19: Blood culture x2> 10/19: Urine Culture> 10/19: MRSA PCR>> negative 10/19: Strep pneumo urinary antigen>negative4 10/19: Legionella urinary antigen>  Antimicrobials:  Azithromycin 10/20> Ceftriaxone 10/20>  OBJECTIVE  Blood pressure (!) 109/54, pulse (!) 56, temperature 99.3 F (37.4 C), temperature source Oral, resp. rate 16, height 5\' 11"  (1.803 m), weight 124.4 kg, SpO2 99%.    Vent Mode: PRVC FiO2 (%):  [40 %] 40 % Set Rate:  [16 bmp] 16 bmp Vt Set:  [500 mL] 500 mL PEEP:  [5 cmH20] 5 cmH20 Plateau Pressure:  [15 cmH20-20 cmH20] 15 cmH20   Intake/Output Summary (Last 24 hours) at 01/02/2023 0549 Last data filed at 01/02/2023 0500 Gross per 24 hour  Intake 656.09 ml  Output 890 ml  Net -233.91 ml   Filed Weights   01/01/23  2204 01/01/23 2215 01/02/23 0400  Weight: 123 kg  125.5 kg 124.4 kg     Physical Examination  GENERAL: 89 year-old critically ill patient lying in the bed intubated and sedated EYES: PEERLA. No scleral icterus. Extraocular muscles intact.  HEENT: Head atraumatic, normocephalic. Oropharynx and nasopharynx clear.  NECK:  No JVD, supple  LUNGS: Decreased breath sounds with wheezing bilaterally.  No use of accessory muscles of respiration.  CARDIOVASCULAR: S1, S2 normal. Murmurs present, rubs, or gallops.  ABDOMEN: Soft, NTND EXTREMITIES: No swelling or erythema.  Capillary refill < 3 seconds in all extremities. Pulses palpable distally. NEUROLOGIC: The patient is intubated and sedated. No focal neurological deficit appreciated. Cranial nerves are intact.  SKIN: No obvious rash, lesion, or ulcer. Warm to touch Labs/imaging that I havepersonally reviewed  (right click and "Reselect all SmartList Selections" daily)     Labs   CBC: Recent Labs  Lab 01/01/23 1656  WBC 4.8  NEUTROABS 3.6  HGB 12.0*  HCT 42.3  MCV 106.3*  PLT 133*    Basic Metabolic Panel: Recent Labs  Lab 01/01/23 1656 01/01/23 2344  NA 141 140  K 6.9* 6.5*  CL 105 103  CO2 31 30  GLUCOSE 163* 152*  BUN 50* 49*  CREATININE 1.86* 1.81*  CALCIUM 9.4 9.2  MG  --  2.6*   GFR: Estimated Creatinine Clearance: 37.9 mL/min (A) (by C-G formula based on SCr of 1.81 mg/dL (H)). Recent Labs  Lab 01/01/23 1656  PROCALCITON <0.10  WBC 4.8  LATICACIDVEN 0.7    Liver Function Tests: Recent Labs  Lab 01/01/23 1656  AST 14*  ALT 14  ALKPHOS 29*  BILITOT 0.6  PROT 6.7  ALBUMIN 3.5   No results for input(s): "LIPASE", "AMYLASE" in the last 168 hours. No results for input(s): "AMMONIA" in the last 168 hours.  ABG    Component Value Date/Time   PHART 7.46 (H) 01/02/2023 0433   PCO2ART 45 01/02/2023 0433   PO2ART 67 (L) 01/02/2023 0433   HCO3 32.0 (H) 01/02/2023 0433   O2SAT 96.3 01/02/2023 0433     Coagulation Profile: Recent Labs  Lab  01/01/23 1656  INR 1.3*    Cardiac Enzymes: No results for input(s): "CKTOTAL", "CKMB", "CKMBINDEX", "TROPONINI" in the last 168 hours.  HbA1C: Hgb A1c MFr Bld  Date/Time Value Ref Range Status  01/01/2023 10:09 PM 6.2 (H) 4.8 - 5.6 % Final    Comment:    (NOTE) Pre diabetes:          5.7%-6.4%  Diabetes:              >6.4%  Glycemic control for   <7.0% adults with diabetes     CBG: Recent Labs  Lab 01/01/23 2350 01/02/23 0350  GLUCAP 125* 85    Review of Systems:   Unable to be obtained secondary to the patient's intubated and sedated status.   Past Medical History  He,  has a past medical history of Diabetes mellitus without complication (HCC), DVT (deep venous thrombosis) (HCC), Embolus (HCC), and HTN (hypertension).   Surgical History    Past Surgical History:  Procedure Laterality Date   TOTAL HIP ARTHROPLASTY       Social History   reports that he has never smoked. He has never used smokeless tobacco. He reports that he does not currently use alcohol. He reports that he does not use drugs.   Family History   His family history includes Bladder Cancer in his brother.  Allergies Allergies  Allergen Reactions   Tylenol [Acetaminophen]     rash     Home Medications  Prior to Admission medications   Medication Sig Start Date End Date Taking? Authorizing Provider  amLODipine (NORVASC) 5 MG tablet Take 5 mg by mouth daily.    [provider]  carvedilol (COREG) 12.5 MG tablet Take 12.5 mg by mouth 2 (two) times daily. 10/15/21   [provider]  ELIQUIS 5 MG TABS tablet Take 1 tablet (5 mg total) by mouth 2 (two) times daily. Home med. 02/01/22   Darlin Priestly, MD  ezetimibe (ZETIA) 10 MG tablet Take 10 mg by mouth daily.    [provider]  fenofibrate 54 MG tablet Take 54 mg by mouth daily.    [provider]  glipiZIDE (GLUCOTROL XL) 5 MG 24 hr tablet Take 5 mg by mouth daily.    [provider]  JUBLIA 10 %  SOLN Apply topically daily. 08/11/21   [provider]  levothyroxine (SYNTHROID) 25 MCG tablet Take 25 mcg by mouth every morning.    [provider]  metFORMIN (GLUCOPHAGE-XR) 500 MG 24 hr tablet 500 mg 2 (two) times daily with a meal.    [provider]  omega-3 acid ethyl esters (LOVAZA) 1 g capsule Take 2 capsules by mouth 2 (two) times daily. 12/30/21   [provider]  PREVIDENT 5000 PLUS 1.1 % CREA dental cream Place 1 Application onto teeth at bedtime. 01/12/22   [provider]  ramipril (ALTACE) 5 MG capsule Take by mouth. 10/15/21   [provider]  rosuvastatin (CRESTOR) 10 MG tablet Take 1 tablet by mouth daily. 12/09/21   [provider]  Scheduled Meds:  budesonide (PULMICORT) nebulizer solution  0.25 mg Nebulization BID   Chlorhexidine Gluconate Cloth  6 each Topical Q2000   docusate  100 mg Per Tube BID   heparin  5,000 Units Subcutaneous Q8H   insulin aspart  0-20 Units Subcutaneous Q4H   ipratropium-albuterol  3 mL Nebulization Q6H   levothyroxine  25 mcg Per Tube q morning   methylPREDNISolone (SOLU-MEDROL) injection  40 mg Intravenous Daily   mouth rinse  15 mL Mouth Rinse Q2H   polyethylene glycol  17 g Per Tube Daily   rosuvastatin  10 mg Per Tube Daily   sodium zirconium cyclosilicate  10 g Per Tube TID   Continuous Infusions:  azithromycin Stopped (01/02/23 0455)   cefTRIAXone (ROCEPHIN)  IV Stopped (01/02/23 0347)   famotidine (PEPCID) IV 100 mL/hr at 01/02/23 0000   midazolam 4.5 mg/hr (01/02/23 0500)   propofol (DIPRIVAN) infusion Stopped (01/01/23 2342)   PRN Meds:.docusate, ipratropium-albuterol, mouth rinse, polyethylene glycol  Active Hospital Problem list   See systems below  Assessment & Plan:  #Acute on chronic Hypoxic Hypercapnic Respiratory Failure: #Bilateral pneumonia #CO2 narcosis,OSA not on CPAP -LTVV -VAP prevention protocol -PRN &Scheduled bronchodilators -daily SAT &  SBT -Wean PEEP and FiO2 for sats greater than 90% -Follow chest x-ray, ABG prn.  -prn fentanyl, propofol  for RASS -1   #Sepsis secondary to bilateral pneumonia CT chest shows bilateral consolidation concerning  -F/u cultures, trend lactic/ PCT -Monitor WBC/ fever curve -Start Ceftriaxone  & Azithromycin -IVF hydration as needed -follow up trach aspirate -Pressors for MAP goal >65 -Strict I/O's   #HTN #HLD #Severe LVH: Noted on prior TTE -Suspected in the setting of HTN,  noted on prior TTE. Evaluated with PYP in the past which was negative for amyloid.   #  pAF:  -rate controlled. -Now bradycardic likely in the setting of hyperkalemia/sedation -Start Heparin hold apixaban for now -Hold metoprolol due to Bradycardia  #h/o PE with IVC filter: retrievable filter placed in 2014, but left in place for significant period of time, elected to leave in place given risks of removal.  -Hold apixaban as above and start Heparin   #Acute Toxic Metabolic Encephalopathy -hypercapnia/CO2 narcosis Hx of Subdural Hematomas -CTA head negative for acute intracranial abnormality -Limit sedation as able   #AKI on CKD stage III #Acute on Chronic Hyperkalemia -Monitor I&O's / urinary output -Follow BMP -Ensure adequate renal perfusion -Avoid nephrotoxic agents as able -continue Lokelma -Replace electrolytes as indicated   #T2DM -HgbA1c goal pending -CBG's q4; Target range of 140 to 180 -SSI -Follow ICU Hypo/Hyperglycemia protocol -Hold home meds  #Hypothyroidism -TSH and free T4 wnl -Continue Synthroid     Best practice:  Diet:  NPO Pain/Anxiety/Delirium protocol (if indicated): Yes (RASS goal -1) VAP protocol (if indicated): Yes DVT prophylaxis: Subcutaneous Heparin GI prophylaxis: H2B Glucose control:  SSI Yes Central venous access:  N/A Arterial line:  N/A Foley:  Yes, and it is still needed Mobility:  bed rest  PT consulted: N/A Last date of multidisciplinary goals of  care discussion [see IPAL] Code Status:  full code Disposition: ICU   = Goals of Care = Code Status Order: FULL  Primary Emergency Contact: Barnes,Frances, Home Phone: [phone] Wishes to pursue full aggressive treatment and intervention options, including CPR and intubation.  Critical care time: 45 minutes        Webb Silversmith DNP, CCRN, FNP-C, AGACNP-BC Acute Care & Family Nurse Practitioner [facility] PCCM on call pager [phone]

## 2023-01-02 NOTE — Progress Notes (Addendum)
PHARMACY CONSULT NOTE  Pharmacy Consult for Electrolyte Monitoring and Replacement   Recent Labs: Potassium (mmol/L)  Date Value  01/02/2023 5.7 (H)   Magnesium (mg/dL)  Date Value  78/46/9629 2.3   Calcium (mg/dL)  Date Value  52/84/1324 9.2   Albumin (g/dL)  Date Value  40/12/2723 3.5   Phosphorus (mg/dL)  Date Value  36/64/4034 2.0 (L)   Sodium (mmol/L)  Date Value  01/02/2023 140     Assessment: Patient is a 89 year old male with a past medical history per chart review of SDH, DVT, HTN, T2DM, Afib, and HLD who presents with AMS. Pharmacy has been consulted to monitor and replace electrolytes per protocol.  Goal of Therapy:  Electrolytes WNL  Plan:  No replacement needed. Lasix and insulin + D5 + lokelma 10 g TID ordered for hyperkalemia. Medical team following potassium q4H.  F/u with AM labs.   Ronnald Ramp ,PharmD Clinical Pharmacist 01/02/2023 8:07 AM

## 2023-01-02 NOTE — Plan of Care (Signed)

## 2023-01-02 NOTE — Progress Notes (Addendum)
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  Hospice nurse visited patient to assess family concerns of respiratory distress, minimal responsiveness and picking behavior.  Patient has been clear about his goals of care with hospice team that he wants to remain a full code so family wanting to honor his wishes and wanted him evaluated.  Patient was admitted to [facility] on 10.19.24 with diagnosis sepsis r/t bilateral pneumonia.  Per Dr. Gordy Savers, this is a related hospital admission  Patient is GIP appropriate for symptom management of respiratory distress requiring intubation and treatment of sepsis with IV antibiotics.  Requires ICU level of care for treatment and continuous assessment of patient who is in critical condition.  Met at patient's bedside with Leeanne Deed NP with PMT.  Daughter [name] and Rayfield Citizen at bedside.  Conversation clarifying goals of care was had.  They want to continue aggressive treatment and if patient is weaned off vent and fails attempt, they would want patient re-intubated.  Patient is awake and interacting some.  Bilateral mitts on as patient is trying to pull out tube.  Respiratory is working on trying to get patient weaned off vent.  Will have further goals of care conversation with patient when he is able to participate in conversation.  Vital Signs:  T 99.3 oral, BP 90/49, P 67, R 15, Oxi 97%  (patient on vent)  I&O- not recorded yet  Abnormal labs:  RBC 3.7, HGB 11.1, HCT 37.6, PLT 119, MCHC 29.5, Glucose 152, BUN 49, Creatinine 1.8, K 6.5, BNP 336.4, AST 14, ALKPHOS 29, INR 1.3 Covid- Negative ABG:  pO2 79; pCO2 101; pH 7.16, HC03 4.2, Oxi 95.7%  Diagnostics:   10/19: Chest Xray IMPRESSION: Bilateral pleural effusions with bibasilar atelectasis.   10/19: CTA head/Neck IMPRESSION: 1. No acute intracranial process. Unchanged  bilateral extra-axial collections, most likely chronic hematomas or hygromas. 2. Evaluation is limited by bolus timing. Within this limitation, no hemodynamically significant stenosis in the neck. 3. Evaluation of the intracranial vasculature is limited by bolus timing. Within this limitation, there is poor visualization the left V4, which may be occluded or terminate in PICA. No other intracranial large vessel occlusion or high-grade stenosis. If further evaluation is indicated, consider MRA. 4. Aortic atherosclerosis.   10/19: CTA Chest, abdomen and pelvis IMPRESSION: Bilateral lower lobe consolidation with associated small effusions similar to that seen on prior plain film. Cholelithiasis without complicating factors.  IV/PRN Meds:  Calcium gluconate 10% 1g IV x1 D50% 1 ampule IV x1 Amidate 20mg  IV x1 Lasix 40mg  IV x1 Dilaudid 1mg  IV x1 Solumedrol 40mg  IV BID Zemuron 100mg  IV x1 Sodium Bicarb IV x1 Zithromax 500mg  IV every day Rocephin 1g IV every day Pepcid 20mg  IVPB BID Versed continuous infusion Diprivan continuous infusion  Procedures:  Intubation on 10.19.24  Problem list:  per H&P Acute on chronic Hypoxic Hypercapnic Respiratory Failure: Bilateral pneumonia CO2 narcosis,OSA not on CPAP -LTVV -VAP prevention protocol -PRN &Scheduled bronchodilators -daily SAT & SBT -Wean PEEP and FiO2 for sats greater than 90% -Follow chest x-ray, ABG prn.  -prn fentanyl, propofol  for RASS -1    2. Sepsis secondary to bilateral pneumonia CT chest shows bilateral consolidation concerning  -F/u cultures, trend lactic/ PCT -Monitor WBC/ fever curve -Start Ceftriaxone  & Azithromycin -IVF hydration as needed -follow up trach aspirate -Pressors for MAP goal >65 -Strict I/O's   3.  HTN HLD Severe LVH: Noted on prior TTE -Suspected in the setting of HTN,  noted on prior TTE. Evaluated with PYP in the past which was negative for amyloid.   4.  pAF:  -rate  controlled. -Now bradycardic likely in the setting of hyperkalemia/sedation -Start Heparin hold apixaban for now -Hold metoprolol due to Bradycardia  5.   h/o PE with IVC filter: retrievable filter placed in 2014, but left in place for significant period of time, elected to leave in place given risks of removal.  -Hold apixaban as above and start Heparin   6.  Acute Toxic Metabolic Encephalopathy -hypercapnia/CO2 narcosis Hx of Subdural Hematomas -CTA head negative for acute intracranial abnormality -Limit sedation as able   7. AKI on CKD stage III Acute on Chronic Hyperkalemia -Monitor I&O's / urinary output -Follow BMP -Ensure adequate renal perfusion -Avoid nephrotoxic agents as able -Start Lokelma -Replace electrolytes as indicated   8. T2DM -HgbA1c goal pending -CBG's q4; Target range of 140 to 180 -SSI -Follow ICU Hypo/Hyperglycemia protocol -Hold home meds   9. Hypothyroidism -Check TSH and free T4 -Continue Synthroid  Discharge Planning:  Ongoing- Day 1 of admission Family contact:   IDT:  Updated   Goals of Care- clear- patient has been clear with wanting full code, including cpr and intubation.  Family honoring his wishes.  Medication list given to ICU secretary to place on shadow chart.   Norris Cross, RN Nurse Liaison [phone]

## 2023-01-03 LAB — BASIC METABOLIC PANEL
Anion gap: 9 (ref 5–15)
BUN: 47 mg/dL — ABNORMAL HIGH (ref 8–23)
CO2: 31 mmol/L (ref 22–32)
Calcium: 9.5 mg/dL (ref 8.9–10.3)
Chloride: 103 mmol/L (ref 98–111)
Creatinine, Ser: 1.95 mg/dL — ABNORMAL HIGH (ref 0.61–1.24)
GFR, Estimated: 32 mL/min — ABNORMAL LOW (ref >60)
Glucose, Bld: 151 mg/dL — ABNORMAL HIGH (ref 70–99)
Potassium: 5.5 mmol/L — ABNORMAL HIGH (ref 3.5–5.1)
Sodium: 143 mmol/L (ref 135–145)

## 2023-01-03 LAB — POTASSIUM
Potassium: 5.2 mmol/L — ABNORMAL HIGH (ref 3.5–5.1)
Potassium: 5.3 mmol/L — ABNORMAL HIGH (ref 3.5–5.1)
Potassium: 5.3 mmol/L — ABNORMAL HIGH (ref 3.5–5.1)
Potassium: 5.6 mmol/L — ABNORMAL HIGH (ref 3.5–5.1)

## 2023-01-03 LAB — HEPARIN LEVEL (UNFRACTIONATED): Heparin Unfractionated: >1.1 [IU]/mL — ABNORMAL HIGH (ref 0.30–0.70)

## 2023-01-03 LAB — GLUCOSE, CAPILLARY
Glucose-Capillary: 129 mg/dL — ABNORMAL HIGH (ref 70–99)
Glucose-Capillary: 140 mg/dL — ABNORMAL HIGH (ref 70–99)
Glucose-Capillary: 150 mg/dL — ABNORMAL HIGH (ref 70–99)
Glucose-Capillary: 164 mg/dL — ABNORMAL HIGH (ref 70–99)
Glucose-Capillary: 177 mg/dL — ABNORMAL HIGH (ref 70–99)
Glucose-Capillary: 182 mg/dL — ABNORMAL HIGH (ref 70–99)
Glucose-Capillary: 196 mg/dL — ABNORMAL HIGH (ref 70–99)

## 2023-01-03 LAB — APTT
aPTT: 37 s — ABNORMAL HIGH (ref 24–36)
aPTT: >200 s (ref 24–36)

## 2023-01-03 LAB — CBC
HCT: 39.2 % (ref 39.0–52.0)
Hemoglobin: 12.1 g/dL — ABNORMAL LOW (ref 13.0–17.0)
MCH: 30.2 pg (ref 26.0–34.0)
MCHC: 30.9 g/dL (ref 30.0–36.0)
MCV: 97.8 fL (ref 80.0–100.0)
Platelets: 131 10*3/uL — ABNORMAL LOW (ref 150–400)
RBC: 4.01 MIL/uL — ABNORMAL LOW (ref 4.22–5.81)
RDW: 14.1 % (ref 11.5–15.5)
WBC: 5.8 10*3/uL (ref 4.0–10.5)
nRBC: 0 % (ref 0.0–0.2)

## 2023-01-03 LAB — BLOOD GAS, ARTERIAL
Bicarbonate: 35.6 mmol/L — ABNORMAL HIGH (ref 20.0–28.0)
FIO2: 40 %
Mechanical Rate: 16
Mode: 500 mL
O2 Saturation: 94 mmol/L — ABNORMAL HIGH (ref 0.0–2.0)
PEEP: 500 mL
Patient temperature: 37
Patient temperature: 94 %
pH, Arterial: 63 mmHg — ABNORMAL HIGH (ref 7.35–7.45)
pH, Arterial: 7.36 cmH2O (ref 7.35–7.45)
pO2, Arterial: 64 mm[Hg] — ABNORMAL LOW (ref 83–48)
pO2, Arterial: 64 mmol/L — ABNORMAL LOW (ref 83–28.0)

## 2023-01-03 LAB — PROTIME-INR
INR: 1.2 (ref 0.8–1.2)
Prothrombin Time: 15 s (ref 11.4–15.2)

## 2023-01-03 LAB — PHOSPHORUS: Phosphorus: 4.3 mg/dL (ref 2.5–4.6)

## 2023-01-03 LAB — MAGNESIUM: Magnesium: 2.3 mg/dL (ref 1.7–2.4)

## 2023-01-03 MED ORDER — HEPARIN (PORCINE) 25000 UT/250ML-% IV SOLN
1500.0000 [IU]/h | INTRAVENOUS | Status: DC
  Administered 2023-01-04: 1500 [IU]/h via INTRAVENOUS
  Filled 2023-01-03: qty 250

## 2023-01-03 MED ORDER — HEPARIN (PORCINE) 25000 UT/250ML-% IV SOLN
1800.0000 [IU]/h | INTRAVENOUS | Status: DC
  Administered 2023-01-03: 1800 [IU]/h via INTRAVENOUS
  Filled 2023-01-03: qty 250

## 2023-01-03 MED ORDER — IPRATROPIUM-ALBUTEROL 0.5-2.5 (3) MG/3ML IN SOLN
3.0000 mL | Freq: Two times a day (BID) | RESPIRATORY_TRACT | Status: DC
  Administered 2023-01-03 – 2023-01-05 (×4): 3 mL via RESPIRATORY_TRACT
  Filled 2023-01-03 (×4): qty 3

## 2023-01-03 MED ORDER — METHYLPREDNISOLONE SODIUM SUCC 40 MG IJ SOLR
40.0000 mg | Freq: Every day | INTRAMUSCULAR | Status: DC
  Administered 2023-01-04 – 2023-01-05 (×2): 40 mg via INTRAVENOUS
  Filled 2023-01-03 (×2): qty 1

## 2023-01-03 NOTE — Progress Notes (Signed)
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  Hospice nurse visited patient to assess family concerns of respiratory distress, minimal responsiveness and picking behavior.  Patient has been clear about his goals of care with hospice team that he wants to remain a full code so family wanting to honor his wishes and wanted him evaluated.  Patient was admitted to [facility] on 10.19.24 with diagnosis sepsis r/t bilateral pneumonia.  Per Dr. Gordy Savers, this is a related hospital admission   Patient is GIP appropriate for symptom management of respiratory distress and treatment of sepsis with IV antibiotics.  Requires ICU level of care for treatment and continuous assessment of interventions.   Vital Signs:   BP 94/47, , P 63, R 24, Oxi 99% 2L O2/[place]   I&O-  Cumulative Intake:  Net cumulative I/O: 90ml   Abnormal labs:   K 5.5, Glucose 151, BUN 47, Creat 1.95,  GFR 32, RBC 4.01, HGB 12.1, Plat 131,    Diagnostics:  no new   IV/PRN Meds:  Zithromax 500mg  IV every day Rocephin 1g IV every day Pepcid 20mg  IVPB BID Heparin infusion   Procedures:   Intubation on 10.19.24 Extubated on 10.20.24   Problem list:  per MD note Acute on chronic Hypoxic Hypercapnic Respiratory Failure: Bilateral pneumonia CO2 narcosis,OSA not on CPAP -PRN &Scheduled bronchodilators -daily SAT & SBT  2. Sepsis secondary to bilateral pneumonia CT chest shows bilateral consolidation concerning  -IV antibiotics continued   3.  HTN HLD Severe LVH: Noted on prior TTE -Suspected in the setting of HTN,   4.  pAF:  -rate controlled. -Heparin infusion continues  5.   h/o PE with IVC filter: retrievable filter placed in 2014, but left in place for significant period of time, elected to leave in place given risks of removal.  -Hold apixaban as above and start Heparin   6.  Acute  Toxic Metabolic Encephalopathy -hypercapnia/CO2 narcosis Hx of Subdural Hematomas -CTA head negative for acute intracranial abnormality -Limit sedation as able   7. AKI on CKD stage III Acute on Chronic Hyperkalemia -Monitor I&O's / urinary output -Follow BMP -Ensure adequate renal perfusion -Avoid nephrotoxic agents as able -Start Lokelma -Replace electrolytes as indicated   8. T2DM -HgbA1c goal pending -CBG's q4; Target range of 140 to 180 -SSI -Follow ICU Hypo/Hyperglycemia protocol -Hold home meds   9. Hypothyroidism -Check TSH and free T4 -Continue Synthroid   Discharge Planning:  Ongoing- Day 2 of admission Family contact:  endorse continued treatment with IV antibiotics with plan for discharge home as soon as patient is able. IDT:  Updated   Goals of Care- clear- patient has been clear with wanting full code, including cpr and intubation.    Norris Cross, RN Nurse Liaison [phone]

## 2023-01-03 NOTE — Progress Notes (Signed)
PROGRESS NOTE    THURSTON SHEETS III  ZOX:[id number] DOB: 06-Dec-1932 DOA: 01/01/2023 PCP: Kandyce Rud, MD    Brief Narrative:  89 y.o with significant PMH of pAF on apixaban, HTN, hypertriglyceridemia, T2DM, severe LVH, h/o DVT/PE s/p IVC filter, hypothyroidism, and CKD stage III, and subdural hematoma who presented to the ED with chief complaints of altered mental status.  Patient remained stuporous and unresponsive.  Due to VBG showing acute respiratory acidosis with profound hypercapnia and declining mental status, patient was intubated for airway protection.  Patient's electrolytes were corrected and PCCM contacted for admission.  Found to have and being treated for acute on chronic hypoxic and hypercapnic respiratory failure secondary to bilateral pneumonia as well as meeting sepsis criteria   It is noted that Dr. Lowell Guitar is actively enrolled as a hospice patient with [facility].  Patient remains full code full scope of care after discussions with palliative care and PCCM team.  Hospice liaison is also following peripherally and is aware of patient's wishes for full CODE STATUS.  Successfully extubated on 10/20.  Transferred to hospitalist service.   Assessment & Plan:   Principal Problem:   Acute hypoxic on chronic hypercapnic respiratory failure (HCC)  Acute on chronic hypoxic hypercapnic respiratory failure Bilateral community-acquired pneumonia Patient was intubated for airway protection on arrival.  Successfully extubated to nasal cannula on 10/20.  Transferred to hospital service. Plan: Continue stepdown for now.  Can consider transfer to general medical floor within the next 24 hours CAP coverage Bronchodilators Wean oxygen as tolerated  Sepsis secondary to community-acquired pneumonia Cultures no growth to date Continue Rocephin and azithromycin Not in shock, no indication for vasopressor support  Paroxysmal atrial fibrillation Rate controlled On  heparin gtt. Metoprolol on hold Apixaban on hold  History of PE status post IVC filter Filter placed in 2014.  Left in place Apixaban on hold.  Heparin drip initiated  Hypertension Hyperlipidemia Oral agents on hold for now  Acute toxic metabolic encephalopathy Advance senile dementia History of subdural hematoma Encephalopathy likely multifactorial in nature.  CT head negative for acute intracranial abnormality.  Patient off all sedative agents remains lethargic.  AKI on CKD stage III Hyperkalemia Twice daily Lokelma Monitor potassium every 12 hours Consider treatment with insulin/D50 if potassium climbs  Type 2 diabetes mellitus Oral agents on hold Sliding scale coverage  Hypothyroidism Synthroid when able to take p.o. medications  Functional paraplegia Patient has not walked in over 2 months Overall stable. Continue leg supporters  DVT prophylaxis: IV heparin Code Status: Full Family Communication: Caregiver at bedside 10/21 Disposition Plan: Status is: Inpatient Remains inpatient appropriate because: Multiple acute issues as above   Level of care: Stepdown  Consultants:  Palliative care  Procedures:  Endotracheal intubation  Antimicrobials: Ceftriaxone Azithromycin   Subjective: Seen and examined.  Caregiver at bedside.  Patient verbally unresponsive.  Objective: Vitals:   01/03/23 1200 01/03/23 1254 01/03/23 1300 01/03/23 1400  BP: (!) 141/63  (!) 128/54 (!) 113/55  Pulse: (!) 57  (!) 50 (!) 51  Resp: (!) 23  (!) 31 (!) 25  Temp:  97.7 F (36.5 C)    TempSrc:  Axillary    SpO2: 97%  97% 95%  Weight:      Height:        Intake/Output Summary (Last 24 hours) at 01/03/2023 1538 Last data filed at 01/03/2023 1406 Gross per 24 hour  Intake 418.55 ml  Output 980 ml  Net -561.45 ml   [employer]  01/01/23 2215 01/02/23 0400 01/03/23 0600  Weight: 125.5 kg 124.4 kg 124.1 kg    Examination:  General exam: Obtunded.  Chronically  ill-appearing Respiratory system: Mild bibasilar crackles.  Normal work of breathing.  3 L Cardiovascular system: S1-S2, regular rate, irregular rhythm, no murmurs Gastrointestinal system: Soft, NT/ND, normal bowel sounds Central nervous system: Obtunded.  Oriented x 0. Extremities: 0-5 power in bilateral lower extremities.  Unable to assess gait Skin: No obvious skin breakdown Psychiatry: Unable to assess      Data Reviewed: I have personally reviewed following labs and imaging studies  CBC: Recent Labs  Lab 01/01/23 1656 01/02/23 0702 01/03/23 0345  WBC 4.8 5.7 5.8  NEUTROABS 3.6  --   --   HGB 12.0* 11.1* 12.1*  HCT 42.3 37.6* 39.2  MCV 106.3* 99.7 97.8  PLT 133* 119* 131*   Basic Metabolic Panel: Recent Labs  Lab 01/01/23 1656 01/01/23 2344 01/02/23 0702 01/02/23 0805 01/02/23 1148 01/02/23 1957 01/02/23 2345 01/03/23 0345 01/03/23 0843 01/03/23 1414  NA 141 140 140 137  --   --   --  143  --   --   K 6.9* 6.5* 5.7* 5.7*   < > 5.7* 5.6* 5.5* 5.3* 5.3*  CL 105 103 103 101  --   --   --  103  --   --   CO2 31 30 29 28   --   --   --  31  --   --   GLUCOSE 163* 152* 64* 236*  --   --   --  151*  --   --   BUN 50* 49* 46* 45*  --   --   --  47*  --   --   CREATININE 1.86* 1.81* 1.74* 1.84*  --   --   --  1.95*  --   --   CALCIUM 9.4 9.2 9.2 8.9  --   --   --  9.5  --   --   MG  --  2.6* 2.3  --   --   --   --  2.3  --   --   PHOS  --   --  2.0*  --   --   --   --  4.3  --   --    < > = values in this interval not displayed.   GFR: Estimated Creatinine Clearance: 35.1 mL/min (A) (by C-G formula based on SCr of 1.95 mg/dL (H)). Liver Function Tests: Recent Labs  Lab 01/01/23 1656  AST 14*  ALT 14  ALKPHOS 29*  BILITOT 0.6  PROT 6.7  ALBUMIN 3.5   No results for input(s): "LIPASE", "AMYLASE" in the last 168 hours. No results for input(s): "AMMONIA" in the last 168 hours. Coagulation Profile: Recent Labs  Lab 01/01/23 1656 01/03/23 0843  INR 1.3* 1.2    Cardiac Enzymes: No results for input(s): "CKTOTAL", "CKMB", "CKMBINDEX", "TROPONINI" in the last 168 hours. BNP (last 3 results) No results for input(s): "PROBNP" in the last 8760 hours. HbA1C: Recent Labs    01/01/23 2209  HGBA1C 6.2*   CBG: Recent Labs  Lab 01/02/23 1921 01/02/23 2319 01/03/23 0402 01/03/23 0733 01/03/23 1249  GLUCAP 153* 143* 140* 129* 150*   Lipid Profile: Recent Labs    01/02/23 0702  TRIG 141   Thyroid Function Tests: Recent Labs    01/01/23 2344  TSH 3.824  FREET4 0.80   Anemia Panel: No results for input(s): "VITAMINB12", "  FOLATE", "FERRITIN", "TIBC", "IRON", "RETICCTPCT" in the last 72 hours. Sepsis Labs: Recent Labs  Lab 01/01/23 1656  PROCALCITON <0.10  LATICACIDVEN 0.7    Recent Results (from the past 240 hour(s))  Blood Culture (routine x 2)     Status: None (Preliminary result)   Collection Time: 01/01/23  4:56 PM   Specimen: BLOOD  Result Value Ref Range Status   Specimen Description BLOOD BLOOD RIGHT ARM  Final   Special Requests   Final    BOTTLES DRAWN AEROBIC AND ANAEROBIC Blood Culture adequate volume   Culture   Final    NO GROWTH 2 DAYS Performed at [facility], [address] [place], [place] [address]    Report Status PENDING  Incomplete  SARS Coronavirus 2 by RT PCR (hospital order, performed in [facility] hospital lab) *cepheid single result test* Anterior Nasal Swab     Status: None   Collection Time: 01/01/23  5:00 PM   Specimen: Anterior Nasal Swab  Result Value Ref Range Status   SARS Coronavirus 2 by RT PCR NEGATIVE NEGATIVE Final    Comment: (NOTE) SARS-CoV-2 target nucleic acids are NOT DETECTED.  The SARS-CoV-2 RNA is generally detectable in upper and lower respiratory specimens during the acute phase of infection. The lowest concentration of SARS-CoV-2 viral copies this assay can detect is 250 copies / mL. A negative result does not preclude SARS-CoV-2 infection and should not be  used as the sole basis for treatment or other patient management decisions.  A negative result may occur with improper specimen collection / handling, submission of specimen other than nasopharyngeal swab, presence of viral mutation(s) within the areas targeted by this assay, and inadequate number of viral copies (<250 copies / mL). A negative result must be combined with clinical observations, patient history, and epidemiological information.  Fact Sheet for Patients:   RoadLapTop.co.za  Fact Sheet for Healthcare Providers: http://kim-miller.com/  This test is not yet approved or  cleared by the [place] FDA and has been authorized for detection and/or diagnosis of SARS-CoV-2 by FDA under an Emergency Use Authorization (EUA).  This EUA will remain in effect (meaning this test can be used) for the duration of the COVID-19 declaration under Section 564(b)(1) of the Act, 21 U.S.C. section 360bbb-3(b)(1), unless the authorization is terminated or revoked sooner.  Performed at [facility], [address] [place], [place] [address]   MRSA Next Gen by PCR, Nasal     Status: None   Collection Time: 01/01/23 10:13 PM   Specimen: Nasal Mucosa; Nasal Swab  Result Value Ref Range Status   MRSA by PCR Next Gen NOT DETECTED NOT DETECTED Final    Comment: (NOTE) The GeneXpert MRSA Assay (FDA approved for NASAL specimens only), is one component of a comprehensive MRSA colonization surveillance program. It is not intended to diagnose MRSA infection nor to guide or monitor treatment for MRSA infections. Test performance is not FDA approved in patients less than 98 years old. Performed at [facility], [address] [place], [place] [address]   Blood Culture (routine x 2)     Status: None (Preliminary result)   Collection Time: 01/02/23  8:12 AM   Specimen: BLOOD  Result Value Ref Range Status   Specimen Description  BLOOD BLOOD LEFT ARM  Final   Special Requests   Final    BOTTLES DRAWN AEROBIC AND ANAEROBIC Blood Culture adequate volume   Culture   Final    NO GROWTH <  24 HOURS Performed at [facility], [address] [place], [place] [address]    Report Status PENDING  Incomplete         Radiology Studies: CT ANGIO HEAD NECK W WO CM  Result Date: 01/02/2023 CLINICAL DATA:  Altered mental status, stroke suspected EXAM: CT ANGIOGRAPHY HEAD AND NECK WITH AND WITHOUT CONTRAST TECHNIQUE: Multidetector CT imaging of the head and neck was performed using the standard protocol during bolus administration of intravenous contrast. Multiplanar CT image reconstructions and MIPs were obtained to evaluate the vascular anatomy. Carotid stenosis measurements (when applicable) are obtained utilizing NASCET criteria, using the distal internal carotid diameter as the denominator. RADIATION DOSE REDUCTION: This exam was performed according to the departmental dose-optimization program which includes automated exposure control, adjustment of the mA and/or kV according to patient size and/or use of iterative reconstruction technique. CONTRAST:  75mL OMNIPAQUE IOHEXOL 350 MG/ML SOLN COMPARISON:  No prior CTA available, correlation is made with 01/20/2022 CT head FINDINGS: CT HEAD FINDINGS Brain: No evidence of acute infarct, hemorrhage, mass, mass effect, or midline shift. No hydrocephalus. 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 Vascular: No hyperdense vessel. Skull: Negative for fracture or focal lesion. Sinuses/Orbits: No acute finding. Other: The mastoid air cells are well aerated. CTA NECK FINDINGS Evaluation is limited by bolus timing. Aortic arch: Two-vessel arch with a common origin of the brachiocephalic and left common carotid arteries. Imaged portion shows no evidence of aneurysm or dissection. No significant  stenosis of the major arch vessel origins. Aortic atherosclerosis. Right carotid system: No evidence of dissection, occlusion, or hemodynamically significant stenosis (greater than 50%). Left carotid system: No evidence of dissection, occlusion, or hemodynamically significant stenosis (greater than 50%). Atherosclerotic disease at the bifurcation and in the proximal ICA is not hemodynamically significant. Retropharyngeal course of the proximal left ICA. Vertebral arteries: Evaluation of the vertebral arteries is particularly limited by bolus timing. Within this limitation, no evidence of significant stenosis, occlusion, or dissection. Skeleton: No acute osseous abnormality. Degenerative changes in the cervical spine. Other neck: Endotracheal and orogastric tubes noted. Upper chest: For findings in the thorax, please see same day CT chest. Review of the MIP images confirms the above findings CTA HEAD FINDINGS Evaluation is limited by bolus timing. Anterior circulation: Both internal carotid arteries are patent to the termini, with mild stenosis in the right cavernous segment. Aplastic right A1. Patent left A1. Left dominant system. Normal anterior communicating artery. Anterior cerebral arteries are patent to their distal aspects without significant stenosis. No M1 stenosis or occlusion. Calcifications are noted distal right greater than left M1 segment. MCA branches perfused to their distal aspects without significant stenosis. Posterior circulation: Poor visualization of the left V4, which may be occluded. Mild stenosis in the proximal right V4. Basilar patent to its distal aspect without significant stenosis. Superior cerebellar arteries patent proximally. Patent left P1. Aplastic right P1. Fetal origin the right PCA from the right posterior communicating artery. Proximal PCAs appear well perfused, with visualization of the more distal PCAs limited. Venous sinuses: Not well opacified. Anatomic variants: Fetal  origin of the right PCA. No evidence of aneurysm or vascular malformation. Review of the MIP images confirms the above findings IMPRESSION: 1. No acute intracranial process. 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 2. Evaluation is limited by bolus timing. Within this limitation, no hemodynamically significant stenosis in the neck. 3. Evaluation of the intracranial vasculature is  limited by bolus timing. Within this limitation, there is poor visualization the left V4, which may be occluded or terminate in PICA. No other intracranial large vessel occlusion or high-grade stenosis. If further evaluation is indicated, consider MRA. 4. Aortic atherosclerosis. Aortic Atherosclerosis (ICD10-I70.0). Electronically Signed   By: Wiliam Ke M.D.   On: 01/02/2023 00:30   CT CHEST W CONTRAST  Result Date: 01/01/2023 CLINICAL DATA:  Difficulty breathing EXAM: CT CHEST WITH CONTRAST TECHNIQUE: Multidetector CT imaging of the chest was performed during intravenous contrast administration. RADIATION DOSE REDUCTION: This exam was performed according to the departmental dose-optimization program which includes automated exposure control, adjustment of the mA and/or kV according to patient size and/or use of iterative reconstruction technique. CONTRAST:  75mL OMNIPAQUE IOHEXOL 350 MG/ML SOLN COMPARISON:  Chest x-ray from earlier in the same day. FINDINGS: Cardiovascular: Thoracic aorta shows atherosclerotic calcifications without aneurysmal dilatation. Heart is mildly enlarged in size. Coronary calcifications are noted. Pulmonary artery as visualized shows no large central pulmonary embolus although not timed for embolus evaluation. Mediastinum/Nodes: Endotracheal tube and gastric catheter are noted in satisfactory position. Esophagus is within normal limits. No hilar or mediastinal adenopathy is noted. Thoracic inlet is unremarkable. Lungs/Pleura: Lungs are well aerated  bilaterally. Bilateral lower lobe consolidation is noted similar to that seen on prior plain film examination. Small effusions are noted bilaterally. No parenchymal nodules are seen. Upper Abdomen: Visualized upper abdomen shows evidence of cholelithiasis. Musculoskeletal: Degenerative changes of the thoracic spine are noted. No acute rib abnormality is noted. IMPRESSION: Bilateral lower lobe consolidation with associated small effusions similar to that seen on prior plain film. Cholelithiasis without complicating factors. Electronically Signed   By: Alcide Clever M.D.   On: 01/01/2023 23:37   DG Abd Portable 1V  Result Date: 01/01/2023 CLINICAL DATA:  OG tube placement. EXAM: PORTABLE ABDOMEN - 1 VIEW COMPARISON:  01/01/2023 FINDINGS: Limited field of view for tube placement verification. An enteric tube is present with tip projecting over the left upper quadrant consistent with location in the body of the stomach. IMPRESSION: Enteric tube projects over the left upper quadrant consistent with location in the body of the stomach. Electronically Signed   By: Burman Nieves M.D.   On: 01/01/2023 20:50   DG Chest Port 1 View  Result Date: 01/01/2023 CLINICAL DATA:  Intubation and OG tube placement EXAM: PORTABLE CHEST 1 VIEW COMPARISON:  01/01/2023 FINDINGS: An enteric tube is present with tip measuring 5.3 cm above the carina. No definite enteric tube is identified. Possible coiled tube in the pharyngeal region. Shallow inspiration with atelectasis in the lung bases. Probable small pleural effusions. Cardiac enlargement. No focal consolidation. No pneumothorax. Calcification of the aorta. IMPRESSION: 1. Endotracheal tube positioned with tip above the carina. 2. No definite enteric tube is identified, possibly coiled in the pharynx. 3. Shallow inspiration with atelectasis in the lung bases and small effusions. Electronically Signed   By: Burman Nieves M.D.   On: 01/01/2023 20:49   DG Abd 1  View  Result Date: 01/01/2023 CLINICAL DATA:  OG tube placement EXAM: ABDOMEN - 1 VIEW COMPARISON:  Chest radiograph 01/01/2023 FINDINGS: Limited field of view for tube placement verification. No enteric tube is demonstrated within the field of view. There is evidence of atelectasis in the lung bases. Visualized bowel gas pattern is normal without small or large bowel distention. An inferior vena caval filter is present. Degenerative changes in the spine. Vascular calcifications. IMPRESSION: No enteric tube is demonstrated within the field  of view. Electronically Signed   By: Burman Nieves M.D.   On: 01/01/2023 20:48   DG Chest Port 1 View  Result Date: 01/01/2023 CLINICAL DATA:  Possible sepsis with shortness of breath EXAM: PORTABLE CHEST 1 VIEW COMPARISON:  04/08/2009 FINDINGS: Cardiac shadow is enlarged. Aortic calcifications are again seen. Small bilateral pleural effusions are noted as well as bibasilar atelectasis. No bony abnormality is noted. IMPRESSION: Bilateral pleural effusions with bibasilar atelectasis. Electronically Signed   By: Alcide Clever M.D.   On: 01/01/2023 19:16        Scheduled Meds:  budesonide (PULMICORT) nebulizer solution  0.25 mg Nebulization BID   Chlorhexidine Gluconate Cloth  6 each Topical Q2000   docusate  100 mg Per Tube BID   insulin aspart  0-20 Units Subcutaneous Q4H   ipratropium-albuterol  3 mL Nebulization BID   levothyroxine  25 mcg Per Tube q morning   [START ON 01/04/2023] methylPREDNISolone (SOLU-MEDROL) injection  40 mg Intravenous Daily   mouth rinse  15 mL Mouth Rinse 4 times per day   polyethylene glycol  17 g Per Tube Daily   rosuvastatin  10 mg Per Tube Daily   sodium zirconium cyclosilicate  10 g Per Tube TID   Continuous Infusions:  azithromycin Stopped (01/03/23 0647)   cefTRIAXone (ROCEPHIN)  IV Stopped (01/03/23 0447)   famotidine (PEPCID) IV Stopped (01/03/23 0847)   heparin 1,800 Units/hr (01/03/23 1223)     LOS: 2 days    CRITICAL CARE Performed by: Tresa Moore   Total critical care time: 45 minutes  Critical care time was exclusive of separately billable procedures and treating other patients.  Critical care was necessary to treat or prevent imminent or life-threatening deterioration.  Critical care was time spent personally by me on the following activities: development of treatment plan with patient and/or surrogate as well as nursing, discussions with consultants, evaluation of patient's response to treatment, examination of patient, obtaining history from patient or surrogate, ordering and performing treatments and interventions, ordering and review of laboratory studies, ordering and review of radiographic studies, pulse oximetry and re-evaluation of patient's condition.     Tresa Moore, MD [facility]   If 7PM-7AM, please contact night-coverage  01/03/2023, 3:38 PM

## 2023-01-03 NOTE — Consult Note (Signed)
PHARMACY - ANTICOAGULATION CONSULT NOTE  Pharmacy Consult for Heparin  Indication: pulmonary embolus  Allergies  Allergen Reactions   Tylenol [Acetaminophen]     rash   Patient Measurements: Height: 5\' 11"  (180.3 cm) Weight: 124.1 kg (273 lb 9.5 oz) IBW/kg (Calculated) : 75.3 Heparin Dosing Weight: 103.5 kg  Vital Signs: Temp: 98.6 F (37 C) (10/21 2000) Temp Source: Axillary (10/21 2000) BP: 105/46 (10/21 2200) Pulse Rate: 51 (10/21 2200)  Labs: Recent Labs    01/01/23 1656 01/01/23 2344 01/02/23 0702 01/02/23 0805 01/03/23 0345 01/03/23 0843 01/03/23 2145  HGB 12.0*  --  11.1*  --  12.1*  --   --   HCT 42.3  --  37.6*  --  39.2  --   --   PLT 133*  --  119*  --  131*  --   --   APTT 34  --   --   --   --  37* >200*  LABPROT 16.0*  --   --   --   --  15.0  --   INR 1.3*  --   --   --   --  1.2  --   HEPARINUNFRC  --   --   --   --   --  >1.10*  --   CREATININE 1.86*   < > 1.74* 1.84* 1.95*  --   --   TROPONINIHS 18*  --   --   --   --   --   --    < > = values in this interval not displayed.   Estimated Creatinine Clearance: 35.1 mL/min (A) (by C-G formula based on SCr of 1.95 mg/dL (H)).  Medical History: Past Medical History:  Diagnosis Date   Diabetes mellitus without complication (HCC)    DVT (deep venous thrombosis) (HCC)    Embolus (HCC)    HTN (hypertension)    Medications:  PTA apixaban  Assessment: Troy Mueller is an 89 year old male with altered mental status. PMH is significant for DVT on Eliquis, hypertension, diabetes, atrial fibrillation, subdural hematoma. Baseline labs: HL > 1.10, aPTT 37, and INR 1.2.   Goal of Therapy:  Heparin level 0.3-0.7 units/ml aPTT 66-102 seconds Monitor platelets by anticoagulation protocol: Yes   Plan:  10/21:  aPTT @ 2145 = > 200 - spoke with lab who confirmed sample was drawn from opposite arm as heparin infusion - will hold heparin drip for 1 hr and restart at 1500 units/hr - will recheck aPTT and HL  8 hrs after restart Will check aPTT until correlation with HL Will monitor H/H and platelets  Shiloh Swopes D, PharmD 01/03/2023 11:04 PM

## 2023-01-03 NOTE — Consult Note (Signed)
PHARMACY - ANTICOAGULATION CONSULT NOTE  Pharmacy Consult for Heparin  Indication: pulmonary embolus  Allergies  Allergen Reactions   Tylenol [Acetaminophen]     rash   Patient Measurements: Height: 5\' 11"  (180.3 cm) Weight: 124.1 kg (273 lb 9.5 oz) IBW/kg (Calculated) : 75.3 Heparin Dosing Weight: 103.5 kg  Vital Signs: BP: 94/47 (10/21 1100) Pulse Rate: 51 (10/21 1100)  Labs: Recent Labs    01/01/23 1656 01/01/23 2344 01/02/23 0702 01/02/23 0805 01/03/23 0345 01/03/23 0843  HGB 12.0*  --  11.1*  --  12.1*  --   HCT 42.3  --  37.6*  --  39.2  --   PLT 133*  --  119*  --  131*  --   APTT 34  --   --   --   --  37*  LABPROT 16.0*  --   --   --   --  15.0  INR 1.3*  --   --   --   --  1.2  HEPARINUNFRC  --   --   --   --   --  >1.10*  CREATININE 1.86*   < > 1.74* 1.84* 1.95*  --   TROPONINIHS 18*  --   --   --   --   --    < > = values in this interval not displayed.   Estimated Creatinine Clearance: 35.1 mL/min (A) (by C-G formula based on SCr of 1.95 mg/dL (H)).  Medical History: Past Medical History:  Diagnosis Date   Diabetes mellitus without complication (HCC)    DVT (deep venous thrombosis) (HCC)    Embolus (HCC)    HTN (hypertension)    Medications:  PTA apixaban  Assessment: Troy Mueller is an 89 year old male with altered mental status. PMH is significant for DVT on Eliquis, hypertension, diabetes, atrial fibrillation, subdural hematoma. Baseline labs: HL > 1.10, aPTT 37, and INR 1.2.   Goal of Therapy:  Heparin level 0.3-0.7 units/ml aPTT 66-102 seconds Monitor platelets by anticoagulation protocol: Yes   Plan:  Start heparin infusion at 1800 units/hr at 1400  Due to recent administration of SQH will avoid initial bolus of heparin  Will check aPTT level 8 hours after initiation of infusion until correlation with HL Will monitor H/H and platelets  Littie Deeds, PharmD Pharmacy Resident  01/03/2023 12:10 PM

## 2023-01-03 NOTE — Evaluation (Signed)
Physical Therapy Evaluation and Discharge  Patient Details Name: Troy Mueller MRN: [id number] DOB: 05/22/1932 Today's Date: 01/03/2023  History of Present Illness  Patient is a 89 year old male admitted with altered mental status. Acute respiratory acidosis with profound hypercapnia and was intubated for airway protection. Extubated 01/02/23. History of paroxysmal atrial fibrillation, hypertension, type 2 diabetes, severe LVH, DVT/PE s/p IVC filter, CKD, SDH. 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   Clinical Impression  PT evaluation completed. Information was provided by patient's personal caregiver from home at the bedside. Patient has been essentially bed bound for several months. He has been out of bed approximately twice over the past 2 months with a use of a hoyer lift at home. Patient is currently a Hospice patient at home with extensive caregiver assistance and DME in place.  Today, the patient wakes up to voice but frequently drifts off to sleep. He was able to participate with rolling in bed. No participation with exercise. Gentle PROM was provided to extremities with no significant change in vitals noted.  Offered education to caregiver on PROM, repositioning, and body mechanics education however caregiver feels comfortable with providing this care at home. Recommend to continue using Prevalon heel protector boots for skin integrity with use of anti-rotation wedge to promote neutral hip positioning. The patient is likely at his baseline level of functional mobility. No apparent acute PT needs at this time. PT will sign off.       If plan is discharge home, recommend the following: Two people to help with walking and/or transfers;A lot of help with bathing/dressing/bathroom;Assist for transportation;Help with stairs or ramp for entrance;Supervision due to cognitive status;Assistance with feeding;Assistance with  cooking/housework;Direct supervision/assist for financial management;Direct supervision/assist for medications management   Can travel by private vehicle        Equipment Recommendations None recommended by PT  Recommendations for Other Services       Functional Status Assessment Patient has not had a recent decline in their functional status     Precautions / Restrictions Precautions Precautions: Fall Restrictions Weight Bearing Restrictions: No      Mobility  Bed Mobility Overal bed mobility: Needs Assistance Bed Mobility: Rolling Rolling: Max assist         General bed mobility comments: patient does initial reaching across midline with LUE for rolling to the right for routine repositioning for skin integrity    Transfers                   General transfer comment: patient uses a hoyer lift at baseline for OOB activity and has been essentially bed bound for several months at home    Ambulation/Gait                  Stairs            Wheelchair Mobility     Tilt Bed    Modified Rankin (Stroke Patients Only)       Balance                                             Pertinent Vitals/Pain Pain Assessment Pain Assessment: No/denies pain    Home Living Family/patient expects to be discharged to:: Private residence Living Arrangements: Alone (24/7 caregiver assistance) Available Help at Discharge:  Personal care attendant;Available 24 hours/day Type of Home: House         Home Layout: Able to live on main level with bedroom/bathroom Home Equipment: Wheelchair - manual;Hospital bed (hoyer lift)      Prior Function Prior Level of Function : Needs assist             Mobility Comments: dependent at baseline. has been out of bed approximately 2 times in the past 2 months using a hoyer lift. ADLs Comments: assistance required, uses a bed pan for toileting needs     Extremity/Trunk Assessment   Upper  Extremity Assessment Upper Extremity Assessment: Generalized weakness    Lower Extremity Assessment Lower Extremity Assessment: Generalized weakness       Communication   Communication Communication: Difficulty following commands/understanding;Difficulty communicating thoughts/reduced clarity of speech Following commands: Follows one step commands inconsistently Cueing Techniques: Verbal cues  Cognition Arousal: Lethargic Behavior During Therapy: Flat affect Overall Cognitive Status: History of cognitive impairments - at baseline                                 General Comments: patient opens eyes to voice. he has difficulty maintaining alertness during session and frequently drifts off to sleep. minimal to no activate participation with ROM but patient does participate with repositioning in bed        General Comments General comments (skin integrity, edema, etc.): Personal caregiver Reuel Boom at the bedside during evaluation. Offered education on PROM, repositioning, and body mechanics education, however caregiver politely declined and feels comfortable providing care in the home setting. Prevalon heel protector boots in place and encouraged for skin integrity with use of anti-rotation wedge to promote neutral hip positioning.    Exercises Other Exercises Other Exercises: Provided gentle PROM to extremities with no signs of pain noted, no activate participation noted. No significant change in vitals noted with activity   Assessment/Plan    PT Assessment Patient does not need any further PT services  PT Problem List         PT Treatment Interventions      PT Goals (Current goals can be found in the Care Plan section)  Acute Rehab PT Goals PT Goal Formulation: All assessment and education complete, DC therapy    Frequency       Co-evaluation               AM-PAC PT "6 Clicks" Mobility  Outcome Measure Help needed turning from your back to your side  while in a flat bed without using bedrails?: A Lot Help needed moving from lying on your back to sitting on the side of a flat bed without using bedrails?: Total Help needed moving to and from a bed to a chair (including a wheelchair)?: Total Help needed standing up from a chair using your arms (e.g., wheelchair or bedside chair)?: Total Help needed to walk in hospital room?: Total Help needed climbing 3-5 steps with a railing? : Total 6 Click Score: 7    End of Session Equipment Utilized During Treatment: Oxygen Activity Tolerance: Patient limited by lethargy;Patient limited by fatigue Patient left: in bed;with call bell/phone within reach;with bed alarm set;with family/visitor present Nurse Communication: Mobility status      Time: 1308-6578 PT Time Calculation (min) (ACUTE ONLY): 18 min   Charges:   PT Evaluation $PT Eval Moderate Complexity: 1 Mod   PT General Charges $$ ACUTE PT VISIT: 1  Visit         Donna Bernard, PT, MPT   Ina Homes 01/03/2023, 2:30 PM

## 2023-01-03 NOTE — Evaluation (Signed)
Clinical/Bedside Swallow Evaluation Patient Details  Name: Troy Mueller MRN: [id number] Date of Birth: Jun 07, 1932  Today's Date: 01/03/2023 Time: SLP Start Time (ACUTE ONLY): 1020 SLP Stop Time (ACUTE ONLY): 1120 SLP Time Calculation (min) (ACUTE ONLY): 60 min  Past Medical History:  Past Medical History:  Diagnosis Date   Diabetes mellitus without complication (HCC)    DVT (deep venous thrombosis) (HCC)    Embolus (HCC)    HTN (hypertension)    Past Surgical History:  Past Surgical History:  Procedure Laterality Date   TOTAL HIP ARTHROPLASTY     HPI:  Pt is a 89 y.o with significant PMH of pAF on apixaban, HTN, hypertriglyceridemia, T2DM, severe LVH, h/o DVT/PE s/p IVC filter, hypothyroidism, and CKD stage III, and subdural hematoma. 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  On arrival to ED, he remained unresponsive and found to have acute respiratory acidosis with profound hypercapnia and was subsequently intubated for airway protection.  Found to have and being treated for acute on chronic hypoxic and hypercapnic respiratory failure secondary to bilateral pneumonia as well as meeting sepsis criteria.  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  He wishes to reamain a Full Code status.    Chest CT: Bilateral lower lobe consolidation with associated small effusions  similar to that seen on prior plain film.    Assessment / Plan / Recommendation  Clinical Impression   Pt seen for BSE this morning. Pt intermittently awakened given verbal/tactile stim. When awake, he participated functionally, but then dozed off again. Speech was mumbled; vocal quality (of cords) sounded strained and hoarse. Cough was reduced in effort. Dtr and caregiver present.  On Taylor o2 support 2L; afebrile. WBC WNL.  Pt appears to present w/ functional oral  phase swallowing; suspected Moderate pharyngeal phase dysphagia w/ increased risk for aspiration/aspiration pneumonia at this time. 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 ANY Cognitive decline can impact the timing of swallow thus increase risk for aspiration/aspiration pneumonia to occur. Pt has challenging factors that could impact safety oropharyngeal swallowing currently. Pt was intubated/extubated x~1 day; presents w/ declined vocal quality and loudness. Cough effort is reduced, weak. Suspect potential for sensorimotor deficits during the swallow.  Pt consumed po trials via tsp w/ no immediate/overt clinical s/s of aspiration during trials of applesauce, however, delayed, overt s/s of aspiration were noted w/ ice chips and Nectar liquids c/b strained throat clearing/cough. No O2 desaturations noted; no change in HR/RR. Oral phase appeared grossly [facility] w/ timely bolus management and control of bolus propulsion for A-P transfer for swallowing. Oral clearing achieved. Pt required MOD verbal/tactile cues at times to alert/attend to po tasks. When awake, he exhibited good tolerance during swallowing of Applesauce w/ reduced risk for aspiration of such when following general aspiration precautions.  OM Exam appeared [facility] w/ no unilateral weakness noted. Speech muttered/mumbled w/ strained vocal quality and low volume today. Pt required full feeding support.   Recommend maintain an NPO status for diet at this time w/ therapeutic tsp trials of Applesauce for engagement of oropharyngeal swallowing -- strict aspiration precautions and oral care prior. Frequent oral care for stimulation of swallow and hygiene. NSG Supervision. Education given on assessment; aspiration precautions  and therapeutic po's at this time to pt and Daughter present. NSG updated, agreed. Precautions posted on board. MD  updated. ST services will f/u w/ pt's status for ongoing assessment to establish least restrictive oral diet.  Recommend Dietician f/u for support. SLP Visit Diagnosis: Dysphagia, pharyngeal phase (R13.13) (mental status decline)    Aspiration Risk  Moderate aspiration risk;Severe aspiration risk;Risk for inadequate nutrition/hydration    Diet Recommendation   NPO - therapeutic trials of Applesauce w/ NSG Supervision; aspiration precautions.   Medication Administration: Via alternative means    Other  Recommendations Recommended Consults:  (Dietician; Palliative Care following) Oral Care Recommendations: Oral care QID;Oral care before and after PO;Staff/trained caregiver to provide oral care (therapeutic trials of applesauce) Caregiver Recommendations: Avoid jello, ice cream, thin soups, popsicles;Remove water pitcher;Have oral suction available    Recommendations for follow up therapy are one component of a multi-disciplinary discharge planning process, led by the attending physician.  Recommendations may be updated based on patient status, additional functional criteria and insurance authorization.  Follow up Recommendations Follow physician's recommendations for discharge plan and follow up therapies      Assistance Recommended at Discharge  FULL  Functional Status Assessment Patient has had a recent decline in their functional status and demonstrates the ability to make significant improvements in function in a reasonable and predictable amount of time.  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  On arrival to ED, he remained unresponsive and found to have acute respiratory acidosis with profound hypercapnia and was subsequently intubated for airway protection.  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  He wishes to reamain a Full Code status.   Chest CT: Bilateral lower lobe consolidation with associated small effusions  similar to that seen on prior plain film. Type of Study: Bedside Swallow Evaluation Previous Swallow Assessment: none reported Diet Prior to this Study: NPO Temperature Spikes Noted: No (wbc 5.8) Respiratory Status: Nasal cannula (2L) History of Recent Intubation: Yes Total duration of intubation (days): 1 days Date extubated: 01/02/23 Behavior/Cognition: Alert;Cooperative;Pleasant mood;Confused;Lethargic/Drowsy;Distractible;Requires cueing Oral Cavity Assessment: Dry Oral Care Completed by SLP: Yes Oral Cavity - Dentition: Adequate natural dentition Vision:  (n/a) Self-Feeding Abilities: Total assist Patient Positioning: Upright in bed (needed full support) Baseline Vocal Quality: Breathy;Hoarse;Low vocal intensity (post extubation yesterday) Volitional Cough: Weak Volitional Swallow: Able to elicit    Oral/Motor/Sensory Function Overall Oral Motor/Sensory Function: Within functional limits   Ice Chips Ice chips: Impaired Presentation: Spoon (fed; 5 trials) Oral Phase Impairments: Poor awareness of bolus (min) Pharyngeal Phase Impairments: Throat Clearing - Delayed (x2/5)   Thin Liquid Thin Liquid: Not tested    Nectar Thick Nectar Thick Liquid: Impaired Presentation: Spoon (fed; 8 trials) Oral Phase Impairments:   (wfl) Pharyngeal Phase Impairments: Throat Clearing - Delayed;Cough - Delayed (x2)  Honey Thick Honey Thick Liquid: Not tested   Puree Puree: Within functional limits (grossly) Presentation: Spoon (fed; 11 trials) Other Comments: fairly timely w/ oral clearing   Solid     Solid: Not tested        Jerilynn Som, MS, CCC-SLP Speech Language Pathologist Rehab Services; [facility] [phone] (ascom) Moataz Tavis 01/03/2023,2:04 PM

## 2023-01-03 NOTE — Progress Notes (Addendum)
PHARMACY CONSULT NOTE  Pharmacy Consult for Electrolyte Monitoring and Replacement   Recent Labs: Potassium (mmol/L)  Date Value  01/03/2023 5.5 (H)   Magnesium (mg/dL)  Date Value  40/98/1191 2.3   Calcium (mg/dL)  Date Value  47/82/9562 9.5   Albumin (g/dL)  Date Value  13/10/6576 3.5   Phosphorus (mg/dL)  Date Value  46/96/2952 4.3   Sodium (mmol/L)  Date Value  01/03/2023 143   Assessment: Patient is a 89 year old male with a past medical history per chart review of SDH, DVT, HTN, T2DM, Afib, and HLD who presents with AMS. Pharmacy has been consulted to monitor and replace electrolytes per protocol.  Goal of Therapy:  Electrolytes WNL  Plan:  K 5.5; patient remains on Lokelma 10 g TID for hyperkalemia. Medical team following potassium q4H.  No replacement needed F/u with AM labs  Littie Deeds, PharmD Pharmacy Resident  01/03/2023 7:04 AM

## 2023-01-04 LAB — APTT: aPTT: >200 s (ref 24–36)

## 2023-01-04 LAB — CBC
HCT: 39.7 % (ref 39.0–52.0)
Hemoglobin: 11.9 g/dL — ABNORMAL LOW (ref 13.0–17.0)
MCH: 30 pg (ref 26.0–34.0)
MCHC: 30 g/dL (ref 30.0–36.0)
MCV: 100 fL (ref 80.0–100.0)
Platelets: 136 10*3/uL — ABNORMAL LOW (ref 150–400)
RBC: 3.97 MIL/uL — ABNORMAL LOW (ref 4.22–5.81)
RDW: 13.7 % (ref 11.5–15.5)
WBC: 8.9 10*3/uL (ref 4.0–10.5)
nRBC: 0 % (ref 0.0–0.2)

## 2023-01-04 LAB — BASIC METABOLIC PANEL
Anion gap: 9 (ref 5–15)
BUN: 49 mg/dL — ABNORMAL HIGH (ref 8–23)
CO2: 31 mmol/L (ref 22–32)
Calcium: 9 mg/dL (ref 8.9–10.3)
Chloride: 100 mmol/L (ref 98–111)
Creatinine, Ser: 1.89 mg/dL — ABNORMAL HIGH (ref 0.61–1.24)
GFR, Estimated: 34 mL/min — ABNORMAL LOW (ref >60)
Glucose, Bld: 106 mg/dL — ABNORMAL HIGH (ref 70–99)
Potassium: 4.6 mmol/L (ref 3.5–5.1)
Sodium: 140 mmol/L (ref 135–145)

## 2023-01-04 LAB — GLUCOSE, CAPILLARY
Glucose-Capillary: 92 mg/dL (ref 70–99)
Glucose-Capillary: 92 mg/dL (ref 70–99)
Glucose-Capillary: 129 mg/dL — ABNORMAL HIGH (ref 70–99)
Glucose-Capillary: 195 mg/dL — ABNORMAL HIGH (ref 70–99)
Glucose-Capillary: 202 mg/dL — ABNORMAL HIGH (ref 70–99)

## 2023-01-04 LAB — LEGIONELLA PNEUMOPHILA SEROGP 1 UR AG: L. pneumophila Serogp 1 Ur Ag: NEGATIVE

## 2023-01-04 LAB — PHOSPHORUS: Phosphorus: 4 mg/dL (ref 2.5–4.6)

## 2023-01-04 LAB — HEPARIN LEVEL (UNFRACTIONATED): Heparin Unfractionated: >1.1 [IU]/mL — ABNORMAL HIGH (ref 0.30–0.70)

## 2023-01-04 LAB — MAGNESIUM: Magnesium: 2.4 mg/dL (ref 1.7–2.4)

## 2023-01-04 MED ORDER — SODIUM ZIRCONIUM CYCLOSILICATE 10 G PO PACK
10.0000 g | PACK | Freq: Every day | ORAL | Status: DC
  Administered 2023-01-06: 10 g via ORAL
  Filled 2023-01-04: qty 1

## 2023-01-04 MED ORDER — ADULT MULTIVITAMIN W/MINERALS CH
1.0000 | ORAL_TABLET | Freq: Every day | ORAL | Status: DC
  Administered 2023-01-06: 1 via ORAL
  Filled 2023-01-04: qty 1

## 2023-01-04 MED ORDER — ROSUVASTATIN CALCIUM 10 MG PO TABS
10.0000 mg | ORAL_TABLET | Freq: Every day | ORAL | Status: DC
  Administered 2023-01-05 – 2023-01-06 (×2): 10 mg via ORAL
  Filled 2023-01-04 (×2): qty 1

## 2023-01-04 MED ORDER — APIXABAN 5 MG PO TABS
5.0000 mg | ORAL_TABLET | Freq: Two times a day (BID) | ORAL | Status: DC
  Administered 2023-01-04 – 2023-01-06 (×5): 5 mg via ORAL
  Filled 2023-01-04 (×5): qty 1

## 2023-01-04 MED ORDER — ENSURE ENLIVE PO LIQD
237.0000 mL | Freq: Three times a day (TID) | ORAL | Status: DC
  Administered 2023-01-04 – 2023-01-06 (×5): 237 mL via ORAL

## 2023-01-04 MED ORDER — DOCUSATE SODIUM 50 MG/5ML PO LIQD
100.0000 mg | Freq: Two times a day (BID) | ORAL | Status: DC
  Administered 2023-01-05: 100 mg via ORAL
  Filled 2023-01-04 (×3): qty 10

## 2023-01-04 MED ORDER — SODIUM ZIRCONIUM CYCLOSILICATE 5 G PO PACK
10.0000 g | PACK | Freq: Every day | ORAL | Status: DC
  Filled 2023-01-04: qty 2

## 2023-01-04 MED ORDER — POLYETHYLENE GLYCOL 3350 17 G PO PACK: 17.0000 g | PACK | Freq: Every day | ORAL | Status: DC | PRN

## 2023-01-04 MED ORDER — LEVOTHYROXINE SODIUM 50 MCG PO TABS
25.0000 ug | ORAL_TABLET | Freq: Every morning | ORAL | Status: DC
Start: 2023-01-05 — End: 2023-01-06
  Administered 2023-01-05 – 2023-01-06 (×2): 25 ug via ORAL
  Filled 2023-01-04 (×2): qty 1

## 2023-01-04 MED ORDER — DOCUSATE SODIUM 50 MG/5ML PO LIQD
100.0000 mg | Freq: Two times a day (BID) | ORAL | Status: DC | PRN
  Administered 2023-01-05: 100 mg via ORAL
  Filled 2023-01-04: qty 10

## 2023-01-04 MED ORDER — POLYETHYLENE GLYCOL 3350 17 G PO PACK
17.0000 g | PACK | Freq: Every day | ORAL | Status: DC
  Administered 2023-01-05 – 2023-01-06 (×2): 17 g via ORAL
  Filled 2023-01-04 (×2): qty 1

## 2023-01-04 MED ORDER — SODIUM CHLORIDE 0.9 % IV SOLN
2.0000 g | INTRAVENOUS | Status: DC
  Administered 2023-01-05 – 2023-01-06 (×2): 2 g via INTRAVENOUS
  Filled 2023-01-04 (×2): qty 20

## 2023-01-04 NOTE — TOC CM/SW Note (Signed)
Transition of Care [facility]) - Inpatient Brief Assessment   Patient Details  Name: Troy Mueller MRN: [id number] Date of Birth: [birth year]  [facility]) CM/SW Contact:    Margarito Liner, LCSW Phone Number: 01/04/2023, 3:12 PM   Clinical Narrative: CSW reviewed chart. Patient was active with [facility] for home hospice services prior to admission. They are actively following while hospitalized. No TOC needs identified at this time. Please place [facility] consult if any needs arise.  Transition of Care Asessment: Insurance and Status: Insurance coverage has been reviewed Patient has primary care physician: Yes Home environment has been reviewed: Single family home Prior level of function:: Bed bound Prior/Current Home Services: Current home services Social Determinants of Health Reivew: SDOH reviewed no interventions necessary Readmission risk has been reviewed: Yes Transition of care needs: no transition of care needs at this time

## 2023-01-04 NOTE — Plan of Care (Signed)
  Problem: Nutritional: Goal: Maintenance of adequate nutrition will improve Outcome: Progressing   Problem: Clinical Measurements: Goal: Diagnostic test results will improve Outcome: Progressing   Problem: Elimination: Goal: Will not experience complications related to bowel motility Outcome: Progressing   Problem: Pain Managment: Goal: General experience of comfort will improve Outcome: Progressing

## 2023-01-04 NOTE — Progress Notes (Addendum)
Speech Language Pathology Treatment: Dysphagia  Patient Details Name: Troy Mueller MRN: [id number] DOB: Dec 15, 1932 Today's Date: 01/04/2023 Time: 1478-2956 SLP Time Calculation (min) (ACUTE ONLY): 40 min  Assessment / Plan / Recommendation Clinical Impression  Pt seen for ongoing assessment of swallowing; trials to upgrade diet as appropriate. Pt much more awake/alert this morning vs yesterday -- maintaining eyes open w/out constant verbal/tactile stim. He participated functionally and assisted in feeding self holding the Cup to drink. Speech was intelligible; vocal quality (of cords) much improved in loudness and less strained/hoarse. Cough was more productive, strong. Personal caregiver present. He engaged in basic conversation w/ Staff/MD in room w/ min repetition required. On [place] o2 support 2L; afebrile. WBC WNL.  Pt explained general aspiration precautions and agreed verbally to the need for following them especially sitting Upright for all oral intake; need for Small bites/sips Slowly. Supported behind the shoulders and head w/ towel roll for head forward positioning. Pt required positioning support d/t overall weakness.  He helped to feed himself by holding Cup for single sips. No overt clinical s/s of aspiration were noted w/ trials given; no overt coughing, respiratory status remained calm and unlabored w/ no tachypnea, vocal quality clear b/t trials when assessed. O2 sats remained low-mid90s. Pt consumed trials of softened solids, purees w/ no overt deficits noted either. Oral phase appeared [facility] for bolus management, mastication, and timely A-P transfer for swallowing; oral clearing achieved w/ all consistencies. NSG denied any deficits in swallowing of purees prior.   Pt appears improved in medical/respiratory status today overall per MD consult. W/ ongoing po trials, he appears to present w/ reduced risk for aspiration/aspiration pneumonia when following general aspiration  precautions. Recommend a more mech soft diet(caregiver stated foods are cut for him at home Baseline) for ease of soft foods w/ gravies added to moisten foods; Thin liquids VIA CUP(pt does not use straws Baseline). Recommend general aspiration precautions including small bites/sips slowly; less talking/distractions during meals; upright sitting. Pills Whole in Puree moving forward for safety of swallow.  ST services can be available if any new needs while admitted. MD/NSG updated, agreed. Precautions posted at bedside and provided to personal caregiver for home.       HPI HPI: Pt is a 89 y.o with significant PMH of pAF on apixaban, HTN, hypertriglyceridemia, T2DM, severe LVH, h/o DVT/PE s/p IVC filter, hypothyroidism, and CKD stage III, and subdural hematoma.  On arrival to ED, he remained unresponsive and found to have acute respiratory acidosis with profound hypercapnia and was subsequently intubated for airway protection.  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  He wishes to reamain a Full Code status.   Chest CT: Bilateral lower lobe consolidation with associated small effusions  similar to that seen on prior plain film.      SLP Plan  Continue with current plan of care (po check w/ pt/NSG x1)      Recommendations for follow up therapy are one component of a multi-disciplinary discharge planning process, led by the attending physician.  Recommendations may be updated based on patient status, additional functional criteria and insurance authorization.    Recommendations  Diet recommendations: Dysphagia 3 (mechanical soft);Thin liquid Liquids provided via: Cup;No straw Medication Administration: Whole meds with puree (vs crushed in  puree) Supervision: Patient able to  self feed;Staff to assist with self feeding;Full supervision/cueing for compensatory strategies Compensations: Minimize environmental distractions;Slow rate;Lingual sweep for clearance of pocketing;Small sips/bites;Follow solids with liquid Postural Changes and/or Swallow Maneuvers: Out of bed for meals;Seated upright 90 degrees;Upright 30-60 min after meal                 (Dietician f/u) Oral care BID;Oral care before and after PO;Patient independent with oral care (support)   Frequent or constant Supervision/Assistance (d/t comorbidities baseline) Dysphagia, unspecified (R13.10) (mental status decline)     Continue with current plan of care (po check w/ pt/NSG x1)       Jerilynn Som, MS, CCC-SLP Speech Language Pathologist Rehab Services; [facility] [place] [phone] (ascom) Shanequia Kendrick  01/04/2023, 3:40 PM

## 2023-01-04 NOTE — Progress Notes (Signed)
Clarified with Dr Georgeann Oppenheim to keep foley for transfer to med/tele

## 2023-01-04 NOTE — Progress Notes (Signed)
PROGRESS NOTE    Troy Mueller  ZOX:[id number] DOB: 05-Nov-1932 DOA: 01/01/2023 PCP: Kandyce Rud, MD    Brief Narrative:  89 y.o with significant PMH of pAF on apixaban, HTN, hypertriglyceridemia, T2DM, severe LVH, h/o DVT/PE s/p IVC filter, hypothyroidism, and CKD stage Mueller, and subdural hematoma who presented to the ED with chief complaints of altered mental status.  Patient remained stuporous and unresponsive.  Due to VBG showing acute respiratory acidosis with profound hypercapnia and declining mental status, patient was intubated for airway protection.  Patient's electrolytes were corrected and PCCM contacted for admission.  Found to have and being treated for acute on chronic hypoxic and hypercapnic respiratory failure secondary to bilateral pneumonia as well as meeting sepsis criteria   It is noted that Dr. Lowell Guitar is actively enrolled as a hospice patient with [facility].  Patient remains full code full scope of care after discussions with palliative care and PCCM team.  Hospice liaison is also following peripherally and is aware of patient's wishes for full CODE STATUS.  Successfully extubated on 10/20.  Transferred to hospitalist service.   Assessment & Plan:   Principal Problem:   Acute hypoxic on chronic hypercapnic respiratory failure (HCC)  Acute on chronic hypoxic hypercapnic respiratory failure Bilateral community-acquired pneumonia Patient was intubated for airway protection on arrival.  Successfully extubated to nasal cannula on 10/20.  Transferred to hospital service. Plan: Patient stable on 2 L nasal cannula.  Can transfer to medical telemetry.  Completed 3-day course of azithromycin.  Will recommend 7-day course of ceftriaxone.  Continue bronchodilators.  Continue IV steroid for today.  Consider de-escalation to p.o. prednisone and wean in AM.  Sepsis secondary to community-acquired pneumonia Cultures no growth to date Continue Rocephin.   Azithromycin completed Not in shock, no indication for vasopressor support  Paroxysmal atrial fibrillation Rate controlled Metoprolol on hold due to bradycardia Apixaban resumed.  Heparin GTT discontinued  History of PE status post IVC filter Filter placed in 2014.  Left in place Apixaban resumed 12/2008  Hypertension Hyperlipidemia Oral agents on hold for now, consider restarting 24 hours  Acute toxic metabolic encephalopathy Advance senile dementia History of subdural hematoma Encephalopathy likely multifactorial in nature.  CT head negative for acute intracranial abnormality.  Mental status slowly improving.  Patient much more communicative on 10/22  AKI on CKD stage Mueller Hyperkalemia Improved.  Reduce Lokelma to once daily Reduced potassium checks to daily  Type 2 diabetes mellitus Oral agents on hold Sliding scale coverage  Hypothyroidism Consider restarting Synthroid in 24 hours  Functional paraplegia Patient has not walked in over 2 months Overall stable. Continue leg supporters No PT follow-up recommended  DVT prophylaxis: Eliquis Code Status: Full Family Communication: Caregiver at bedside 10/21, 10/22 Disposition Plan: Status is: Inpatient Remains inpatient appropriate because: Multiple acute issues as above   Level of care: Telemetry Medical  Consultants:  Palliative care  Procedures:  Endotracheal intubation  Antimicrobials: Ceftriaxone   Subjective: Seen and examined.  Caregiver at bedside.  Patient much more awake and communicative this morning.  No visible distress.  Objective: Vitals:   01/04/23 0900 01/04/23 1000 01/04/23 1200 01/04/23 1300  BP: (!) 117/54 (!) 140/72 132/63 138/64  Pulse: (!) 50 (!) 104 (!) 52 (!) 53  Resp: (!) 27 14 (!) 28 12  Temp:   98 F (36.7 C)   TempSrc:   Oral   SpO2: 96% 97% 95% 95%  Weight:      Height:  Intake/Output Summary (Last 24 hours) at 01/04/2023 1356 Last data filed at 01/04/2023  0600 Gross per 24 hour  Intake 174.9 ml  Output 500 ml  Net -325.1 ml   Filed Weights   01/02/23 0400 01/03/23 0600 01/04/23 0500  Weight: 124.4 kg 124.1 kg 125.9 kg    Examination:  General exam: Appears frail.  Mentation intact this morning. Respiratory system: Bibasilar crackles.  Normal work of breathing.  2 L Cardiovascular system: S1-S2, regular rate, irregular rhythm, no murmurs Gastrointestinal system: Soft, NT/ND, normal bowel sounds Central nervous system: Alert.  Oriented x 2.  No focal deficits Extremities: 0-5 power in bilateral lower extremities.  Unable to assess gait Skin: No obvious skin breakdown Psychiatry: Mood and affect appropriate    Data Reviewed: I have personally reviewed following labs and imaging studies  CBC: Recent Labs  Lab 01/01/23 1656 01/02/23 0702 01/03/23 0345 01/04/23 0359  WBC 4.8 5.7 5.8 8.9  NEUTROABS 3.6  --   --   --   HGB 12.0* 11.1* 12.1* 11.9*  HCT 42.3 37.6* 39.2 39.7  MCV 106.3* 99.7 97.8 100.0  PLT 133* 119* 131* 136*   Basic Metabolic Panel: Recent Labs  Lab 01/01/23 2344 01/02/23 0702 01/02/23 0805 01/02/23 1148 01/03/23 0345 01/03/23 0843 01/03/23 1414 01/03/23 1758 01/04/23 0359  NA 140 140 137  --  143  --   --   --  140  K 6.5* 5.7* 5.7*   < > 5.5* 5.3* 5.3* 5.2* 4.6  CL 103 103 101  --  103  --   --   --  100  CO2 30 29 28   --  31  --   --   --  31  GLUCOSE 152* 64* 236*  --  151*  --   --   --  106*  BUN 49* 46* 45*  --  47*  --   --   --  49*  CREATININE 1.81* 1.74* 1.84*  --  1.95*  --   --   --  1.89*  CALCIUM 9.2 9.2 8.9  --  9.5  --   --   --  9.0  MG 2.6* 2.3  --   --  2.3  --   --   --  2.4  PHOS  --  2.0*  --   --  4.3  --   --   --  4.0   < > = values in this interval not displayed.   GFR: Estimated Creatinine Clearance: 36.5 mL/min (A) (by C-G formula based on SCr of 1.89 mg/dL (H)). Liver Function Tests: Recent Labs  Lab 01/01/23 1656  AST 14*  ALT 14  ALKPHOS 29*  BILITOT 0.6   PROT 6.7  ALBUMIN 3.5   No results for input(s): "LIPASE", "AMYLASE" in the last 168 hours. No results for input(s): "AMMONIA" in the last 168 hours. Coagulation Profile: Recent Labs  Lab 01/01/23 1656 01/03/23 0843  INR 1.3* 1.2   Cardiac Enzymes: No results for input(s): "CKTOTAL", "CKMB", "CKMBINDEX", "TROPONINI" in the last 168 hours. BNP (last 3 results) No results for input(s): "PROBNP" in the last 8760 hours. HbA1C: Recent Labs    01/01/23 2209  HGBA1C 6.2*   CBG: Recent Labs  Lab 01/03/23 2002 01/03/23 2352 01/04/23 0335 01/04/23 0738 01/04/23 1142  GLUCAP 196* 182* 92 92 129*   Lipid Profile: Recent Labs    01/02/23 0702  TRIG 141   Thyroid Function Tests: Recent Labs  01/01/23 2344  TSH 3.824  FREET4 0.80   Anemia Panel: No results for input(s): "VITAMINB12", "FOLATE", "FERRITIN", "TIBC", "IRON", "RETICCTPCT" in the last 72 hours. Sepsis Labs: Recent Labs  Lab 01/01/23 1656  PROCALCITON <0.10  LATICACIDVEN 0.7    Recent Results (from the past 240 hour(s))  Blood Culture (routine x 2)     Status: None (Preliminary result)   Collection Time: 01/01/23  4:56 PM   Specimen: BLOOD  Result Value Ref Range Status   Specimen Description BLOOD BLOOD RIGHT ARM  Final   Special Requests   Final    BOTTLES DRAWN AEROBIC AND ANAEROBIC Blood Culture adequate volume   Culture   Final    NO GROWTH 3 DAYS Performed at [facility], [address] [place], [place] [address]    Report Status PENDING  Incomplete  SARS Coronavirus 2 by RT PCR (hospital order, performed in [facility] hospital lab) *cepheid single result test* Anterior Nasal Swab     Status: None   Collection Time: 01/01/23  5:00 PM   Specimen: Anterior Nasal Swab  Result Value Ref Range Status   SARS Coronavirus 2 by RT PCR NEGATIVE NEGATIVE Final    Comment: (NOTE) SARS-CoV-2 target nucleic acids are NOT DETECTED.  The SARS-CoV-2 RNA is generally detectable in upper and  lower respiratory specimens during the acute phase of infection. The lowest concentration of SARS-CoV-2 viral copies this assay can detect is 250 copies / mL. A negative result does not preclude SARS-CoV-2 infection and should not be used as the sole basis for treatment or other patient management decisions.  A negative result may occur with improper specimen collection / handling, submission of specimen other than nasopharyngeal swab, presence of viral mutation(s) within the areas targeted by this assay, and inadequate number of viral copies (<250 copies / mL). A negative result must be combined with clinical observations, patient history, and epidemiological information.  Fact Sheet for Patients:   RoadLapTop.co.za  Fact Sheet for Healthcare Providers: http://kim-miller.com/  This test is not yet approved or  cleared by the [place] FDA and has been authorized for detection and/or diagnosis of SARS-CoV-2 by FDA under an Emergency Use Authorization (EUA).  This EUA will remain in effect (meaning this test can be used) for the duration of the COVID-19 declaration under Section 564(b)(1) of the Act, 21 U.S.C. section 360bbb-3(b)(1), unless the authorization is terminated or revoked sooner.  Performed at [facility], [address] [place], [place] [address]   MRSA Next Gen by PCR, Nasal     Status: None   Collection Time: 01/01/23 10:13 PM   Specimen: Nasal Mucosa; Nasal Swab  Result Value Ref Range Status   MRSA by PCR Next Gen NOT DETECTED NOT DETECTED Final    Comment: (NOTE) The GeneXpert MRSA Assay (FDA approved for NASAL specimens only), is one component of a comprehensive MRSA colonization surveillance program. It is not intended to diagnose MRSA infection nor to guide or monitor treatment for MRSA infections. Test performance is not FDA approved in patients less than 7 years old. Performed at [facility], [address] [place], [place] [address]   Blood Culture (routine x 2)     Status: None (Preliminary result)   Collection Time: 01/02/23  8:12 AM   Specimen: BLOOD  Result Value Ref Range Status   Specimen Description BLOOD BLOOD LEFT ARM  Final   Special Requests   Final    BOTTLES DRAWN AEROBIC AND  ANAEROBIC Blood Culture adequate volume   Culture   Final    NO GROWTH 2 DAYS Performed at [facility], [address], [place], [place] [address]    Report Status PENDING  Incomplete         Radiology Studies: No results found.      Scheduled Meds:  apixaban  5 mg Oral BID   budesonide (PULMICORT) nebulizer solution  0.25 mg Nebulization BID   Chlorhexidine Gluconate Cloth  6 each Topical Q2000   docusate  100 mg Oral BID   feeding supplement  237 mL Oral TID BM   insulin aspart  0-20 Units Subcutaneous Q4H   ipratropium-albuterol  3 mL Nebulization BID   [START ON 01/05/2023] levothyroxine  25 mcg Oral q morning   methylPREDNISolone (SOLU-MEDROL) injection  40 mg Intravenous Daily   [START ON 01/05/2023] multivitamin with minerals  1 tablet Oral Daily   mouth rinse  15 mL Mouth Rinse 4 times per day   [START ON 01/05/2023] polyethylene glycol  17 g Oral Daily   [START ON 01/05/2023] rosuvastatin  10 mg Oral Daily   [START ON 01/05/2023] sodium zirconium cyclosilicate  10 g Oral Daily   Continuous Infusions:  cefTRIAXone (ROCEPHIN)  IV Stopped (01/04/23 0350)   famotidine (PEPCID) IV 20 mg (01/04/23 1053)     LOS: 3 days       Tresa Moore, MD [facility]   If 7PM-7AM, please contact night-coverage  01/04/2023, 1:56 PM

## 2023-01-04 NOTE — Progress Notes (Signed)
PHARMACY CONSULT NOTE  Pharmacy Consult for Electrolyte Monitoring and Replacement   Recent Labs: Potassium (mmol/L)  Date Value  01/04/2023 4.6   Magnesium (mg/dL)  Date Value  21/30/8657 2.4   Calcium (mg/dL)  Date Value  84/69/6295 9.0   Albumin (g/dL)  Date Value  28/41/3244 3.5   Phosphorus (mg/dL)  Date Value  03/17/7251 4.0   Sodium (mmol/L)  Date Value  01/04/2023 140   Assessment: Patient is a 89 year old male with a past medical history per chart review of SDH, DVT, HTN, T2DM, Afib, and HLD who presents with AMS. Pharmacy has been consulted to monitor and replace electrolytes per protocol.  Goal of Therapy:  Electrolytes WNL  Plan:  All electrolytes WNL No replacement warranted at this time F/u with AM labs  Littie Deeds, PharmD Pharmacy Resident  01/04/2023 7:24 AM

## 2023-01-04 NOTE — Progress Notes (Signed)
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  Hospice nurse visited patient to assess family concerns of respiratory distress, minimal responsiveness and picking behavior.  Patient has been clear about his goals of care with hospice team that he wants to remain a full code so family wanting to honor his wishes and wanted him evaluated.  Patient was admitted to [facility] on 10.19.24 with diagnosis sepsis r/t bilateral pneumonia.  Per Dr. Gordy Savers, this is a related hospital admission  Met with patient and McKenzie at the bedside.  Patient lying in bed with 02 in place.  He was alert and introduced himself.     Patient is GIP appropriate for symptom management of respiratory distress and treatment of sepsis with IV antibiotics.  Requires ICU level of care for treatment and continuous assessment of interventions.   Vital Signs: Temp: 97.9   BP: 134/70  P: 43   R:  25, Oxi 95% 2L O2/[place]   I&O- 542.9/800   Abnormal labs: glucose 106,  BUN 49,  Creatinine 1.89,  GFR 34,  RBC 3.97,  Hemoglobin 11.9,  Platelets 136.   Diagnostics:  no new   IV/PRN Meds:  Zithromax 500mg  IV every day Rocephin 1g IV every day Pepcid 20mg  IVPB BID Heparin infusion   Procedures:   Intubation on 10.19.24 Extubated on 10.20.24   Problem list Principal Problem:   Acute hypoxic on chronic hypercapnic respiratory failure (HCC)   Acute on chronic hypoxic hypercapnic respiratory failure Bilateral community-acquired pneumonia Patient was intubated for airway protection on arrival.  Successfully extubated to nasal cannula on 10/20.  Transferred to hospital service. Plan: Patient stable on 2 L nasal cannula.  Can transfer to medical telemetry.  Completed 3-day course of azithromycin.  Will recommend 7-day course of ceftriaxone.  Continue bronchodilators.  Continue IV steroid for today.   Consider de-escalation to p.o. prednisone and wean in AM.   Sepsis secondary to community-acquired pneumonia Cultures no growth to date Continue Rocephin.  Azithromycin completed Not in shock, no indication for vasopressor support   Paroxysmal atrial fibrillation Rate controlled Metoprolol on hold due to bradycardia Apixaban resumed.  Heparin GTT discontinued   History of PE status post IVC filter Filter placed in 2014.  Left in place Apixaban resumed 12/2008   Hypertension Hyperlipidemia Oral agents on hold for now, consider restarting 24 hours   Acute toxic metabolic encephalopathy Advance senile dementia History of subdural hematoma Encephalopathy likely multifactorial in nature.  CT head negative for acute intracranial abnormality.  Mental status slowly improving.  Patient much more communicative on 10/22   AKI on CKD stage III Hyperkalemia Improved.  Reduce Lokelma to once daily Reduced potassium checks to daily   Type 2 diabetes mellitus Oral agents on hold Sliding scale coverage   Hypothyroidism Consider restarting Synthroid in 24 hours   Functional paraplegia Patient has not walked in over 2 months Overall stable. Continue leg supporters No PT follow-up recommended   DVT prophylaxis: Eliquis Code Status: Full Family Communication: Caregiver at bedside 10/21, 10/22 Disposition Plan: Status is: Inpatient Remains inpatient appropriate because: Multiple acute issues as above  Discharge Planning:  Ongoing- Family contact:Met with patient and McKenzie at the bedside.   Continue to endorse treatment with IV antibiotics with plan for discharge home as soon as patient is able. IDT:  Updated   Goals of Care- clear- patient has been clear with  wanting full code, including cpr and intubation.     [facility] Liaison [phone]

## 2023-01-04 NOTE — Progress Notes (Signed)
Palliative Care Progress Note   Patient Name: [name]       Date: 01/04/2023 DOB: Oct 16, 1932  Age: 89 y.o. MRN#: [id number] Attending Physician: Tresa Moore, MD Primary Care Physician: Kandyce Rud, MD Admit Date: 01/01/2023  Chart reviewed.  As per previous hospice and palliative care notes, patient and family endorse full code and full scope.  Patient is active with [facility] hospice services and [facility] is following patient for goals and symptom management while GIP status.   I counseled with [facility] liaison Ree Kida who endorses goals are clear/set.   No acute needs from PMT at this time.   Liaison made aware that PMT remains available to patient/family throughout his hospitalization, but will shadow and monitor patient peripherally.   Please re-engage with PMT if acute palliative issues arise that are not able to be addressed by [employer].   Thank you for allowing the Palliative Medicine Team to assist in the care of Troy Mueller.  Samara Deist L. Bonita Quin, DNP, FNP-BC Palliative Medicine Team  No charge

## 2023-01-05 DIAGNOSIS — J189 Pneumonia, unspecified organism: Secondary | ICD-10-CM | POA: Diagnosis not present

## 2023-01-05 LAB — CBC
HCT: 37.7 % — ABNORMAL LOW (ref 39.0–52.0)
Hemoglobin: 11.4 g/dL — ABNORMAL LOW (ref 13.0–17.0)
MCH: 30.2 pg (ref 26.0–34.0)
MCHC: 30.2 g/dL (ref 30.0–36.0)
MCV: 99.7 fL (ref 80.0–100.0)
Platelets: 127 10*3/uL — ABNORMAL LOW (ref 150–400)
RBC: 3.78 MIL/uL — ABNORMAL LOW (ref 4.22–5.81)
RDW: 13.7 % (ref 11.5–15.5)
WBC: 7 10*3/uL (ref 4.0–10.5)
nRBC: 0 % (ref 0.0–0.2)

## 2023-01-05 LAB — BASIC METABOLIC PANEL
Anion gap: 8 (ref 5–15)
BUN: 58 mg/dL — ABNORMAL HIGH (ref 8–23)
CO2: 30 mmol/L (ref 22–32)
Calcium: 8.7 mg/dL — ABNORMAL LOW (ref 8.9–10.3)
Chloride: 100 mmol/L (ref 98–111)
Creatinine, Ser: 1.98 mg/dL — ABNORMAL HIGH (ref 0.61–1.24)
GFR, Estimated: 32 mL/min — ABNORMAL LOW (ref >60)
Glucose, Bld: 169 mg/dL — ABNORMAL HIGH (ref 70–99)
Potassium: 4.6 mmol/L (ref 3.5–5.1)
Sodium: 138 mmol/L (ref 135–145)

## 2023-01-05 LAB — PHOSPHORUS: Phosphorus: 3.5 mg/dL (ref 2.5–4.6)

## 2023-01-05 LAB — GLUCOSE, CAPILLARY
Glucose-Capillary: 116 mg/dL — ABNORMAL HIGH (ref 70–99)
Glucose-Capillary: 150 mg/dL — ABNORMAL HIGH (ref 70–99)
Glucose-Capillary: 170 mg/dL — ABNORMAL HIGH (ref 70–99)
Glucose-Capillary: 218 mg/dL — ABNORMAL HIGH (ref 70–99)
Glucose-Capillary: 234 mg/dL — ABNORMAL HIGH (ref 70–99)
Glucose-Capillary: 267 mg/dL — ABNORMAL HIGH (ref 70–99)

## 2023-01-05 LAB — MAGNESIUM: Magnesium: 2.4 mg/dL (ref 1.7–2.4)

## 2023-01-05 LAB — TRIGLYCERIDES: Triglycerides: 151 mg/dL — ABNORMAL HIGH (ref <150)

## 2023-01-05 MED ORDER — PSYLLIUM 95 % PO PACK
1.0000 | PACK | Freq: Every day | ORAL | Status: DC
  Administered 2023-01-06: 1 via ORAL
  Filled 2023-01-05 (×2): qty 1

## 2023-01-05 MED ORDER — INFLUENZA VAC A&B SURF ANT ADJ 0.5 ML IM SUSY
0.5000 mL | PREFILLED_SYRINGE | INTRAMUSCULAR | Status: AC
  Administered 2023-01-06: 0.5 mL via INTRAMUSCULAR
  Filled 2023-01-05: qty 0.5

## 2023-01-05 NOTE — Progress Notes (Signed)
Pt trasnferred via bed to room 142 with O2. Pt in NAD. Accepting nurse and CN at bedside to receive patient. Family at bedside with patient.

## 2023-01-05 NOTE — Plan of Care (Signed)

## 2023-01-05 NOTE — Progress Notes (Signed)
PROGRESS NOTE    Troy Mueller  NFA:[id number] DOB: 11-27-1932 DOA: 01/01/2023 PCP: Kandyce Rud, MD    Brief Narrative:  89 y.o with significant PMH of pAF on apixaban, HTN, hypertriglyceridemia, T2DM, severe LVH, h/o DVT/PE s/p IVC filter, hypothyroidism, and CKD stage Mueller, and subdural hematoma who presented to the ED with chief complaints of altered mental status.  Patient remained stuporous and unresponsive.  Due to VBG showing acute respiratory acidosis with profound hypercapnia and declining mental status, patient was intubated for airway protection.  Patient's electrolytes were corrected and PCCM contacted for admission.  Found to have and being treated for acute on chronic hypoxic and hypercapnic respiratory failure secondary to bilateral pneumonia as well as meeting sepsis criteria   It is noted that Dr. Lowell Guitar is actively enrolled as a hospice patient with [facility].  Patient remains full code full scope of care after discussions with palliative care and PCCM team.  Hospice liaison is also following peripherally and is aware of patient's wishes for full CODE STATUS.  Successfully extubated on 10/20.  Transferred to hospitalist service.   Assessment & Plan:   Principal Problem:   Acute hypoxic on chronic hypercapnic respiratory failure (HCC) Active Problems:   DVT (deep venous thrombosis) (HCC)   Atrial fibrillation, chronic (HCC)   HTN (hypertension)   Chronic kidney disease, stage 3b (HCC)   Type II diabetes mellitus with renal manifestations (HCC)   Obesity (BMI 30-39.9)  Acute on chronic hypoxic hypercapnic respiratory failure Bilateral community-acquired pneumonia Patient was intubated for airway protection on arrival.  Successfully extubated to nasal cannula on 10/20.  Transferred to hospital service. Plan: Patient stable on 1.5 L nasal cannula.  Is on intermittent o2 at home so is likely close to if not at his baseline.  Completed 3-day course  of azithromycin.  Will recommend 7-day course of abx.  Continue bronchodilators.  Will d/c steroids, no underlying copd/asthma to warrant ongoing use.    Foley catheter - will d/c today  Sepsis secondary to community-acquired pneumonia Blood Cultures no growth to date Continue Rocephin.  Azithromycin completed resolving  Paroxysmal atrial fibrillation Rate controlled Metoprolol on hold due to bradycardia Apixaban resumed.     History of PE status post IVC filter Filter placed in 2014.  Left in place Apixaban resumed 12/2008  Hypertension Hyperlipidemia Oral agents on hold for now give normotension  Acute toxic metabolic encephalopathy Advance senile dementia History of subdural hematoma Encephalopathy likely multifactorial in nature.  CT head negative for acute intracranial abnormality.  Mental status improving  AKI on CKD stage Mueller Hyperkalemia Hyperkalemia resolved, kidney function improving is close to baseline of around 1.7  Type 2 diabetes mellitus Oral agents on hold Sliding scale coverage  Hypothyroidism Consider restarting Synthroid in 24 hours  Functional paraplegia Patient has not walked in over 2 months Overall stable. Continue leg supporters No PT follow-up recommended  DVT prophylaxis: Eliquis Code Status: Full Family Communication: Caregiver at bedside 10/23. Called all 3 daughters this morning, no answers. Disposition Plan: Status is: Inpatient Remains inpatient appropriate because: Multiple acute issues as above   Level of care: Telemetry Medical  Consultants:  Palliative care  Procedures:  Endotracheal intubation  Antimicrobials: Ceftriaxone   Subjective: Seen and examined.  Caregiver at bedside. Communicative. No pain. No dyspnea or cough. Tolerating diet  Objective: Vitals:   01/05/23 0544 01/05/23 0600 01/05/23 0800 01/05/23 0900  BP:  126/81 (!) 122/59 (!) 128/58  Pulse:  (!) 52 (!) 48 Marland Kitchen)  44  Resp:  16 18 20   Temp: 97.6 F  (36.4 C)  98.1 F (36.7 C)   TempSrc: Oral  Oral   SpO2:  93% 96% 92%  Weight:      Height:        Intake/Output Summary (Last 24 hours) at 01/05/2023 1030 Last data filed at 01/05/2023 0448 Gross per 24 hour  Intake 875.25 ml  Output 625 ml  Net 250.25 ml   Filed Weights   01/03/23 0600 01/04/23 0500 01/05/23 0445  Weight: 124.1 kg 125.9 kg 128.1 kg    Examination:  General exam: Appears frail.  NAD Respiratory system: Bibasilar crackles.  Normal work of breathing.  1.5 L Cardiovascular system: S1-S2, regular rate, irregular rhythm, no murmurs Gastrointestinal system: Soft, NT/ND, normal bowel sounds. obese Central nervous system: Alert.  To self and place. Moving all 4 Extremities: warm, 1+ LE edema Psychiatry: a bit confused    Data Reviewed: I have personally reviewed following labs and imaging studies  CBC: Recent Labs  Lab 01/01/23 1656 01/02/23 0702 01/03/23 0345 01/04/23 0359 01/05/23 0546  WBC 4.8 5.7 5.8 8.9 7.0  NEUTROABS 3.6  --   --   --   --   HGB 12.0* 11.1* 12.1* 11.9* 11.4*  HCT 42.3 37.6* 39.2 39.7 37.7*  MCV 106.3* 99.7 97.8 100.0 99.7  PLT 133* 119* 131* 136* 127*   Basic Metabolic Panel: Recent Labs  Lab 01/01/23 2344 01/02/23 0702 01/02/23 0805 01/02/23 1148 01/03/23 0345 01/03/23 0843 01/03/23 1414 01/03/23 1758 01/04/23 0359 01/05/23 0546  NA 140 140 137  --  143  --   --   --  140 138  K 6.5* 5.7* 5.7*   < > 5.5* 5.3* 5.3* 5.2* 4.6 4.6  CL 103 103 101  --  103  --   --   --  100 100  CO2 30 29 28   --  31  --   --   --  31 30  GLUCOSE 152* 64* 236*  --  151*  --   --   --  106* 169*  BUN 49* 46* 45*  --  47*  --   --   --  49* 58*  CREATININE 1.81* 1.74* 1.84*  --  1.95*  --   --   --  1.89* 1.98*  CALCIUM 9.2 9.2 8.9  --  9.5  --   --   --  9.0 8.7*  MG 2.6* 2.3  --   --  2.3  --   --   --  2.4 2.4  PHOS  --  2.0*  --   --  4.3  --   --   --  4.0 3.5   < > = values in this interval not displayed.   GFR: Estimated  Creatinine Clearance: 35.2 mL/min (A) (by C-G formula based on SCr of 1.98 mg/dL (H)). Liver Function Tests: Recent Labs  Lab 01/01/23 1656  AST 14*  ALT 14  ALKPHOS 29*  BILITOT 0.6  PROT 6.7  ALBUMIN 3.5   No results for input(s): "LIPASE", "AMYLASE" in the last 168 hours. No results for input(s): "AMMONIA" in the last 168 hours. Coagulation Profile: Recent Labs  Lab 01/01/23 1656 01/03/23 0843  INR 1.3* 1.2   Cardiac Enzymes: No results for input(s): "CKTOTAL", "CKMB", "CKMBINDEX", "TROPONINI" in the last 168 hours. BNP (last 3 results) No results for input(s): "PROBNP" in the last 8760 hours. HbA1C: No results for input(s): "HGBA1C"  in the last 72 hours.  CBG: Recent Labs  Lab 01/04/23 1535 01/04/23 1934 01/05/23 0005 01/05/23 0338 01/05/23 0828  GLUCAP 195* 202* 218* 170* 116*   Lipid Profile: Recent Labs    01/05/23 0546  TRIG 151*   Thyroid Function Tests: No results for input(s): "TSH", "T4TOTAL", "FREET4", "T3FREE", "THYROIDAB" in the last 72 hours.  Anemia Panel: No results for input(s): "VITAMINB12", "FOLATE", "FERRITIN", "TIBC", "IRON", "RETICCTPCT" in the last 72 hours. Sepsis Labs: Recent Labs  Lab 01/01/23 1656  PROCALCITON <0.10  LATICACIDVEN 0.7    Recent Results (from the past 240 hour(s))  Blood Culture (routine x 2)     Status: None (Preliminary result)   Collection Time: 01/01/23  4:56 PM   Specimen: BLOOD  Result Value Ref Range Status   Specimen Description BLOOD BLOOD RIGHT ARM  Final   Special Requests   Final    BOTTLES DRAWN AEROBIC AND ANAEROBIC Blood Culture adequate volume   Culture   Final    NO GROWTH 4 DAYS Performed at [facility], [address] [place], [place] [address]    Report Status PENDING  Incomplete  SARS Coronavirus 2 by RT PCR (hospital order, performed in [facility] hospital lab) *cepheid single result test* Anterior Nasal Swab     Status: None   Collection Time: 01/01/23  5:00 PM    Specimen: Anterior Nasal Swab  Result Value Ref Range Status   SARS Coronavirus 2 by RT PCR NEGATIVE NEGATIVE Final    Comment: (NOTE) SARS-CoV-2 target nucleic acids are NOT DETECTED.  The SARS-CoV-2 RNA is generally detectable in upper and lower respiratory specimens during the acute phase of infection. The lowest concentration of SARS-CoV-2 viral copies this assay can detect is 250 copies / mL. A negative result does not preclude SARS-CoV-2 infection and should not be used as the sole basis for treatment or other patient management decisions.  A negative result may occur with improper specimen collection / handling, submission of specimen other than nasopharyngeal swab, presence of viral mutation(s) within the areas targeted by this assay, and inadequate number of viral copies (<250 copies / mL). A negative result must be combined with clinical observations, patient history, and epidemiological information.  Fact Sheet for Patients:   RoadLapTop.co.za  Fact Sheet for Healthcare Providers: http://kim-miller.com/  This test is not yet approved or  cleared by the [place] FDA and has been authorized for detection and/or diagnosis of SARS-CoV-2 by FDA under an Emergency Use Authorization (EUA).  This EUA will remain in effect (meaning this test can be used) for the duration of the COVID-19 declaration under Section 564(b)(1) of the Act, 21 U.S.C. section 360bbb-3(b)(1), unless the authorization is terminated or revoked sooner.  Performed at [facility], [address] [place], [place] [address]   MRSA Next Gen by PCR, Nasal     Status: None   Collection Time: 01/01/23 10:13 PM   Specimen: Nasal Mucosa; Nasal Swab  Result Value Ref Range Status   MRSA by PCR Next Gen NOT DETECTED NOT DETECTED Final    Comment: (NOTE) The GeneXpert MRSA Assay (FDA approved for NASAL specimens only), is one component of a comprehensive  MRSA colonization surveillance program. It is not intended to diagnose MRSA infection nor to guide or monitor treatment for MRSA infections. Test performance is not FDA approved in patients less than 51 years old. Performed at [facility], [address], [place], [place] [address]   Blood Culture (routine  x 2)     Status: None (Preliminary result)   Collection Time: 01/02/23  8:12 AM   Specimen: BLOOD  Result Value Ref Range Status   Specimen Description BLOOD BLOOD LEFT ARM  Final   Special Requests   Final    BOTTLES DRAWN AEROBIC AND ANAEROBIC Blood Culture adequate volume   Culture   Final    NO GROWTH 3 DAYS Performed at [facility], [address] [place], [place] [address]    Report Status PENDING  Incomplete         Radiology Studies: No results found.      Scheduled Meds:  apixaban  5 mg Oral BID   Chlorhexidine Gluconate Cloth  6 each Topical Q2000   docusate  100 mg Oral BID   feeding supplement  237 mL Oral TID BM   insulin aspart  0-20 Units Subcutaneous Q4H   levothyroxine  25 mcg Oral q morning   methylPREDNISolone (SOLU-MEDROL) injection  40 mg Intravenous Daily   multivitamin with minerals  1 tablet Oral Daily   mouth rinse  15 mL Mouth Rinse 4 times per day   polyethylene glycol  17 g Oral Daily   rosuvastatin  10 mg Oral Daily   sodium zirconium cyclosilicate  10 g Oral Daily   Continuous Infusions:  cefTRIAXone (ROCEPHIN)  IV Stopped (01/05/23 0340)   famotidine (PEPCID) IV Stopped (01/04/23 1500)     LOS: 4 days       Silvano Bilis, MD [facility]   If 7PM-7AM, please contact night-coverage  01/05/2023, 10:30 AM

## 2023-01-05 NOTE — Progress Notes (Signed)
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  Hospice nurse visited patient to assess family concerns of respiratory distress, minimal responsiveness and picking behavior.  Patient has been clear about his goals of care with hospice team that he wants to remain a full code so family wanting to honor his wishes and wanted him evaluated.  Patient was admitted to [facility] on 10.19.24 with diagnosis sepsis r/t bilateral pneumonia.  Per Dr. Gordy Savers, this is a related hospital admission.   Met with patient and daughter, Troy Mueller in hospital. Patient resting without complaint or signs of distress. Foley catheter removed today and patient is anticipating moving to another room out of the CCU today.     Patient remains GIP appropriate receiving IV steroids and antibiotics. Also with recent intubation, patient needs ongoing high level clinical evaluation.   Vital Signs: 98.1/48/18     122/59    O2 96% on 1.5LPM [place]   I&O- 875/625   Abnormal labs: BUN 58, Cre 1.98, Ca 8.7, GFR 32, HGB 11.4, Plt 127   Diagnostics:  none new   IV/PRN Meds: solumedrol 40mg  IV x 1, and Rocephin 2G IV x 1   Procedures:   Intubation on 10.19.24 Extubated on 10.20.24   Problem list  Acute on chronic hypoxic hypercapnic respiratory failure Bilateral community-acquired pneumonia Patient was intubated for airway protection on arrival.  Successfully extubated to nasal cannula on 10/20.  Transferred to hospital service. Plan: Patient stable on 1.5 L nasal cannula.  Is on intermittent o2 at home so is likely close to if not at his baseline.  Completed 3-day course of azithromycin.  Will recommend 7-day course of abx.  Continue bronchodilators.  Will d/c steroids, no underlying copd/asthma to warrant ongoing use.     Foley catheter - will d/c today   Sepsis secondary to community-acquired  pneumonia Blood Cultures no growth to date Continue Rocephin.  Azithromycin completed resolving   Paroxysmal atrial fibrillation Rate controlled Metoprolol on hold due to bradycardia Apixaban resumed.      History of PE status post IVC filter Filter placed in 2014.  Left in place Apixaban resumed 12/2008   Hypertension Hyperlipidemia Oral agents on hold for now give normotension   Acute toxic metabolic encephalopathy Advance senile dementia History of subdural hematoma Encephalopathy likely multifactorial in nature.  CT head negative for acute intracranial abnormality.  Mental status improving   AKI on CKD stage III Hyperkalemia Hyperkalemia resolved, kidney function improving is close to baseline of around 1.7   Type 2 diabetes mellitus Oral agents on hold Sliding scale coverage   Hypothyroidism Consider restarting Synthroid in 24 hours   Functional paraplegia Patient has not walked in over 2 months Overall stable. Continue leg supporters No PT follow-up recommended   Discharge Planning:  Ongoing  Family contact: Daughter, Troy Mueller at bedside  IDT:  Updated   Goals of Care- Full Code, patient has been clear with wanting full code, including cpr and intubation.     Glenna Fellows, BSN, RN, OCN [employer] [phone]

## 2023-01-05 NOTE — Plan of Care (Signed)
  Problem: Coping: Goal: Ability to adjust to condition or change in health will improve Outcome: Progressing   Problem: Metabolic: Goal: Ability to maintain appropriate glucose levels will improve Outcome: Progressing   Problem: Skin Integrity: Goal: Risk for impaired skin integrity will decrease Outcome: Progressing

## 2023-01-05 NOTE — Progress Notes (Signed)
PHARMACY CONSULT NOTE  Pharmacy Consult for Electrolyte Monitoring and Replacement   Recent Labs: Potassium (mmol/L)  Date Value  01/05/2023 4.6   Magnesium (mg/dL)  Date Value  16/12/9602 2.4   Calcium (mg/dL)  Date Value  54/11/8117 8.7 (L)   Albumin (g/dL)  Date Value  14/78/2956 3.5   Phosphorus (mg/dL)  Date Value  21/30/8657 3.5   Sodium (mmol/L)  Date Value  01/05/2023 138   Assessment: Patient is a 89 year old male with a past medical history per chart review of SDH, DVT, HTN, T2DM, Afib, and HLD who presents with AMS. Pharmacy has been consulted to monitor and replace electrolytes per protocol.  Goal of Therapy:  Electrolytes WNL  Plan:  All electrolytes WNL No replacement warranted at this time F/u with AM labs  Littie Deeds, PharmD Pharmacy Resident  01/05/2023 7:33 AM

## 2023-01-06 DIAGNOSIS — J189 Pneumonia, unspecified organism: Secondary | ICD-10-CM | POA: Diagnosis not present

## 2023-01-06 LAB — GLUCOSE, CAPILLARY
Glucose-Capillary: 148 mg/dL — ABNORMAL HIGH (ref 70–99)
Glucose-Capillary: 155 mg/dL — ABNORMAL HIGH (ref 70–99)
Glucose-Capillary: 251 mg/dL — ABNORMAL HIGH (ref 70–99)
Glucose-Capillary: 271 mg/dL — ABNORMAL HIGH (ref 70–99)
Glucose-Capillary: 96 mg/dL (ref 70–99)

## 2023-01-06 LAB — CULTURE, BLOOD (ROUTINE X 2)
Culture: NO GROWTH
Special Requests: ADEQUATE

## 2023-01-06 LAB — BASIC METABOLIC PANEL
Anion gap: 8 (ref 5–15)
BUN: 61 mg/dL — ABNORMAL HIGH (ref 8–23)
CO2: 30 mmol/L (ref 22–32)
Calcium: 8.6 mg/dL — ABNORMAL LOW (ref 8.9–10.3)
Chloride: 101 mmol/L (ref 98–111)
Creatinine, Ser: 1.57 mg/dL — ABNORMAL HIGH (ref 0.61–1.24)
GFR, Estimated: 42 mL/min — ABNORMAL LOW (ref >60)
Glucose, Bld: 214 mg/dL — ABNORMAL HIGH (ref 70–99)
Potassium: 4.8 mmol/L (ref 3.5–5.1)
Sodium: 139 mmol/L (ref 135–145)

## 2023-01-06 LAB — CBC
HCT: 37.5 % — ABNORMAL LOW (ref 39.0–52.0)
Hemoglobin: 11.4 g/dL — ABNORMAL LOW (ref 13.0–17.0)
MCH: 30 pg (ref 26.0–34.0)
MCHC: 30.4 g/dL (ref 30.0–36.0)
MCV: 98.7 fL (ref 80.0–100.0)
Platelets: 129 10*3/uL — ABNORMAL LOW (ref 150–400)
RBC: 3.8 MIL/uL — ABNORMAL LOW (ref 4.22–5.81)
RDW: 13.4 % (ref 11.5–15.5)
WBC: 5.9 10*3/uL (ref 4.0–10.5)
nRBC: 0 % (ref 0.0–0.2)

## 2023-01-06 LAB — PHOSPHORUS: Phosphorus: 3.1 mg/dL (ref 2.5–4.6)

## 2023-01-06 LAB — MAGNESIUM: Magnesium: 2.4 mg/dL (ref 1.7–2.4)

## 2023-01-06 MED ORDER — AMOXICILLIN-POT CLAVULANATE 875-125 MG PO TABS: 1.0000 | ORAL_TABLET | Freq: Two times a day (BID) | ORAL | 0 refills | Status: DC

## 2023-01-06 NOTE — Progress Notes (Signed)
PHARMACY CONSULT NOTE  Pharmacy Consult for Electrolyte Monitoring and Replacement   Recent Labs: Potassium (mmol/L)  Date Value  01/06/2023 4.8   Magnesium (mg/dL)  Date Value  16/12/9602 2.4   Calcium (mg/dL)  Date Value  54/11/8117 8.6 (L)   Albumin (g/dL)  Date Value  14/78/2956 3.5   Phosphorus (mg/dL)  Date Value  21/30/8657 3.1   Sodium (mmol/L)  Date Value  01/06/2023 139   Assessment: Patient is a 89 year old male with a past medical history per chart review of SDH, DVT, HTN, T2DM, Afib, and HLD who presents with AMS. Pharmacy has been consulted to monitor and replace electrolytes per protocol.  Goal of Therapy:  Electrolytes WNL  Plan:  All electrolytes WNL No replacement warranted at this time F/u with AM labs  Merryl Hacker, PharmD Clinical Pharmacist 01/06/2023 7:10 AM

## 2023-01-06 NOTE — Progress Notes (Signed)
I have reviewed and concur with this student's documentation.   Allyn Kenner, RN 01/06/2023 1:25 PM

## 2023-01-06 NOTE — Discharge Summary (Signed)
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  Patient remained stuporous and unresponsive.  Due to VBG showing acute respiratory acidosis with profound hypercapnia and declining mental status, patient was intubated for airway protection.  Patient's electrolytes were corrected and PCCM contacted for admission.  Hospital Course:  Patient presented with acute hypoxic respiratory failure. Intubated on arrival for airway protection. Extubated on 10/20 and now weaned to 1 liter, is on intermittent o2 at home and so will d/c on oxygen. Patient was treated for community acquired pneumonia with ceftriaxone and azithromycin and his respiratory symptoms have essentially resolved. He will complete a 7 day course of antibiotics with augmentin. His diabetes is well controlled. We will hold his ramipril at discharge, can resume that at pcp f/u as BP allows. Other chronic medical problems  stable. Family in agreement with discharge. He has 24/7 caregivers at home and will resume that care.  Procedures: intubation   Consultations: pccm  Discharge Exam: Vitals:   01/06/23 0044 01/06/23 0754  BP: (!) 141/60 (!) 143/62  Pulse: (!) 56 (!) 42  Resp: 18   Temp: (!) 97.5 F (36.4 C) 97.6 F (36.4 C)  SpO2: 99% 98%    General: NAD Cardiovascular: RRR Respiratory: clear, normal wob, no tachypnea Abdomen: obese, soft and non-tender Ext: warm, some LE edema  Discharge Instructions   Discharge Instructions     Diet - low sodium heart healthy   Complete by: As directed    Increase activity slowly   Complete by: As directed       Allergies as of 01/06/2023       Reactions   Tylenol [acetaminophen]    rash        Medication List     STOP taking these medications    ramipril 5 MG capsule Commonly known as: ALTACE       TAKE these medications    amLODipine 5 MG tablet Commonly known as: NORVASC Take 5 mg by mouth daily.   amoxicillin-clavulanate 875-125 MG tablet Commonly known as: AUGMENTIN Take 1 tablet by mouth 2 (two) times daily.   carvedilol 12.5 MG tablet Commonly known as: COREG Take 12.5 mg by mouth 2 (two) times daily.   Eliquis 5 MG Tabs tablet Generic drug: apixaban Take 1 tablet (5 mg total) by mouth 2 (two) times daily. Home med.   ezetimibe 10 MG tablet Commonly known as: ZETIA Take 10 mg by mouth daily.   fenofibrate 54 MG tablet Take 54  mg by mouth daily.   glipiZIDE 5 MG 24 hr tablet Commonly known as: GLUCOTROL XL Take 5 mg by mouth daily.   Jublia 10 % Soln Generic drug: Efinaconazole Apply topically daily.   levothyroxine 25 MCG tablet Commonly known as: SYNTHROID Take 25 mcg by mouth every morning.   metFORMIN 500 MG 24 hr tablet Commonly known as: GLUCOPHAGE-XR 500 mg 2 (two) times daily with a meal.   omega-3 acid ethyl esters 1 g capsule Commonly known as: LOVAZA Take 2 capsules by mouth 2 (two)  times daily.   PreviDent 5000 Plus 1.1 % Crea dental cream Generic drug: sodium fluoride Place 1 Application onto teeth at bedtime.   rosuvastatin 10 MG tablet Commonly known as: CRESTOR Take 1 tablet by mouth daily.       Allergies  Allergen Reactions   Tylenol [Acetaminophen]     rash    Follow-up Information     Kandyce Rud, MD Follow up.   Specialty: Family Medicine Contact information: [address] [facility] [place] [address] [phone]                  The results of significant diagnostics from this hospitalization (including imaging, microbiology, ancillary and laboratory) are listed below for reference.    Significant Diagnostic Studies: CT ANGIO HEAD NECK W WO CM  Result Date: 01/02/2023 CLINICAL DATA:  Altered mental status, stroke suspected EXAM: CT ANGIOGRAPHY HEAD AND NECK WITH AND WITHOUT CONTRAST TECHNIQUE: Multidetector CT imaging of the head and neck was performed using the standard protocol during bolus administration of intravenous contrast. Multiplanar CT image reconstructions and MIPs were obtained to evaluate the vascular anatomy. Carotid stenosis measurements (when applicable) are obtained utilizing NASCET criteria, using the distal internal carotid diameter as the denominator. RADIATION DOSE REDUCTION: This exam was performed according to the departmental dose-optimization program which includes automated exposure control, adjustment of the mA and/or kV according to patient size and/or use of iterative reconstruction technique. CONTRAST:  75mL OMNIPAQUE IOHEXOL 350 MG/ML SOLN COMPARISON:  No prior CTA available, correlation is made with 01/20/2022 CT head FINDINGS: CT HEAD FINDINGS Brain: No evidence of acute infarct, hemorrhage, mass, mass effect, or midline shift. No hydrocephalus. Redemonstrated bilateral extra-axial collections, which appear unchanged from the prior exam, most likely chronic  hematomas or hygromas. Vascular: No hyperdense vessel. Skull: Negative for fracture or focal lesion. Sinuses/Orbits: No acute finding. Other: The mastoid air cells are well aerated. CTA NECK FINDINGS Evaluation is limited by bolus timing. Aortic arch: Two-vessel arch with a common origin of the brachiocephalic and left common carotid arteries. Imaged portion shows no evidence of aneurysm or dissection. No significant stenosis of the major arch vessel origins. Aortic atherosclerosis. Right carotid system: No evidence of dissection, occlusion, or hemodynamically significant stenosis (greater than 50%). Left carotid system: No evidence of dissection, occlusion, or hemodynamically significant stenosis (greater than 50%). Atherosclerotic disease at the bifurcation and in the proximal ICA is not hemodynamically significant. Retropharyngeal course of the proximal left ICA. Vertebral arteries: Evaluation of the vertebral arteries is particularly limited by bolus timing. Within this limitation, no evidence of significant stenosis, occlusion, or dissection. Skeleton: No acute osseous abnormality. Degenerative changes in the cervical spine. Other neck: Endotracheal and orogastric tubes noted. Upper chest: For findings in the thorax, please see same day CT chest. Review of the MIP images confirms the above findings CTA HEAD FINDINGS Evaluation is limited by bolus timing. Anterior circulation: Both  internal carotid arteries are patent to the termini, with mild stenosis in the right cavernous segment. Aplastic right A1. Patent left A1. Left dominant system. Normal anterior communicating artery. Anterior cerebral arteries are patent to their distal aspects without significant stenosis. No M1 stenosis or occlusion. Calcifications are noted distal right greater than left M1 segment. MCA branches perfused to their distal aspects without significant stenosis. Posterior circulation: Poor visualization of the left V4, which may be  occluded. Mild stenosis in the proximal right V4. Basilar patent to its distal aspect without significant stenosis. Superior cerebellar arteries patent proximally. Patent left P1. Aplastic right P1. Fetal origin the right PCA from the right posterior communicating artery. Proximal PCAs appear well perfused, with visualization of the more distal PCAs limited. Venous sinuses: Not well opacified. Anatomic variants: Fetal origin of the right PCA. No evidence of aneurysm or vascular malformation. Review of the MIP images confirms the above findings IMPRESSION: 1. No acute intracranial process. 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 2. Evaluation is limited by bolus timing. Within this limitation, no hemodynamically significant stenosis in the neck. 3. Evaluation of the intracranial vasculature is limited by bolus timing. Within this limitation, there is poor visualization the left V4, which may be occluded or terminate in PICA. No other intracranial large vessel occlusion or high-grade stenosis. If further evaluation is indicated, consider MRA. 4. Aortic atherosclerosis. Aortic Atherosclerosis (ICD10-I70.0). Electronically Signed   By: Wiliam Ke M.D.   On: 01/02/2023 00:30   CT CHEST W CONTRAST  Result Date: 01/01/2023 CLINICAL DATA:  Difficulty breathing EXAM: CT CHEST WITH CONTRAST TECHNIQUE: Multidetector CT imaging of the chest was performed during intravenous contrast administration. RADIATION DOSE REDUCTION: This exam was performed according to the departmental dose-optimization program which includes automated exposure control, adjustment of the mA and/or kV according to patient size and/or use of iterative reconstruction technique. CONTRAST:  75mL OMNIPAQUE IOHEXOL 350 MG/ML SOLN COMPARISON:  Chest x-ray from earlier in the same day. FINDINGS: Cardiovascular: Thoracic aorta shows atherosclerotic calcifications without aneurysmal dilatation. Heart is mildly  enlarged in size. Coronary calcifications are noted. Pulmonary artery as visualized shows no large central pulmonary embolus although not timed for embolus evaluation. Mediastinum/Nodes: Endotracheal tube and gastric catheter are noted in satisfactory position. Esophagus is within normal limits. No hilar or mediastinal adenopathy is noted. Thoracic inlet is unremarkable. Lungs/Pleura: Lungs are well aerated bilaterally. Bilateral lower lobe consolidation is noted similar to that seen on prior plain film examination. Small effusions are noted bilaterally. No parenchymal nodules are seen. Upper Abdomen: Visualized upper abdomen shows evidence of cholelithiasis. Musculoskeletal: Degenerative changes of the thoracic spine are noted. No acute rib abnormality is noted. IMPRESSION: Bilateral lower lobe consolidation with associated small effusions similar to that seen on prior plain film. Cholelithiasis without complicating factors. Electronically Signed   By: Alcide Clever M.D.   On: 01/01/2023 23:37   DG Abd Portable 1V  Result Date: 01/01/2023 CLINICAL DATA:  OG tube placement. EXAM: PORTABLE ABDOMEN - 1 VIEW COMPARISON:  01/01/2023 FINDINGS: Limited field of view for tube placement verification. An enteric tube is present with tip projecting over the left upper quadrant consistent with location in the body of the stomach. IMPRESSION: Enteric tube projects over the left upper quadrant consistent with location in the body of the stomach. Electronically Signed   By: Burman Nieves M.D.   On: 01/01/2023 20:50   DG Chest Port 1 View  Result Date: 01/01/2023 CLINICAL DATA:  Intubation and  OG tube placement EXAM: PORTABLE CHEST 1 VIEW COMPARISON:  01/01/2023 FINDINGS: An enteric tube is present with tip measuring 5.3 cm above the carina. No definite enteric tube is identified. Possible coiled tube in the pharyngeal region. Shallow inspiration with atelectasis in the lung bases. Probable small pleural effusions.  Cardiac enlargement. No focal consolidation. No pneumothorax. Calcification of the aorta. IMPRESSION: 1. Endotracheal tube positioned with tip above the carina. 2. No definite enteric tube is identified, possibly coiled in the pharynx. 3. Shallow inspiration with atelectasis in the lung bases and small effusions. Electronically Signed   By: Burman Nieves M.D.   On: 01/01/2023 20:49   DG Abd 1 View  Result Date: 01/01/2023 CLINICAL DATA:  OG tube placement EXAM: ABDOMEN - 1 VIEW COMPARISON:  Chest radiograph 01/01/2023 FINDINGS: Limited field of view for tube placement verification. No enteric tube is demonstrated within the field of view. There is evidence of atelectasis in the lung bases. Visualized bowel gas pattern is normal without small or large bowel distention. An inferior vena caval filter is present. Degenerative changes in the spine. Vascular calcifications. IMPRESSION: No enteric tube is demonstrated within the field of view. Electronically Signed   By: Burman Nieves M.D.   On: 01/01/2023 20:48   DG Chest Port 1 View  Result Date: 01/01/2023 CLINICAL DATA:  Possible sepsis with shortness of breath EXAM: PORTABLE CHEST 1 VIEW COMPARISON:  04/08/2009 FINDINGS: Cardiac shadow is enlarged. Aortic calcifications are again seen. Small bilateral pleural effusions are noted as well as bibasilar atelectasis. No bony abnormality is noted. IMPRESSION: Bilateral pleural effusions with bibasilar atelectasis. Electronically Signed   By: Alcide Clever M.D.   On: 01/01/2023 19:16    Microbiology: Recent Results (from the past 240 hour(s))  Blood Culture (routine x 2)     Status: None   Collection Time: 01/01/23  4:56 PM   Specimen: BLOOD  Result Value Ref Range Status   Specimen Description BLOOD BLOOD RIGHT ARM  Final   Special Requests   Final    BOTTLES DRAWN AEROBIC AND ANAEROBIC Blood Culture adequate volume   Culture   Final    NO GROWTH 5 DAYS Performed at [facility], [address] [place], [place] [address]    Report Status 01/06/2023 FINAL  Final  SARS Coronavirus 2 by RT PCR (hospital order, performed in [facility] hospital lab) *cepheid single result test* Anterior Nasal Swab     Status: None   Collection Time: 01/01/23  5:00 PM   Specimen: Anterior Nasal Swab  Result Value Ref Range Status   SARS Coronavirus 2 by RT PCR NEGATIVE NEGATIVE Final    Comment: (NOTE) SARS-CoV-2 target nucleic acids are NOT DETECTED.  The SARS-CoV-2 RNA is generally detectable in upper and lower respiratory specimens during the acute phase of infection. The lowest concentration of SARS-CoV-2 viral copies this assay can detect is 250 copies / mL. A negative result does not preclude SARS-CoV-2 infection and should not be used as the sole basis for treatment or other patient management decisions.  A negative result may occur with improper specimen collection / handling, submission of specimen other than nasopharyngeal swab, presence of viral mutation(s) within the areas targeted by this assay, and inadequate number of viral copies (<250 copies / mL). A negative result must be combined with clinical observations, patient history, and epidemiological information.  Fact Sheet for Patients:   RoadLapTop.co.za  Fact Sheet for Healthcare Providers: http://kim-miller.com/  This test is not yet  approved or  cleared by the [place] and has been authorized for detection and/or diagnosis of SARS-CoV-2 by FDA under an Emergency Use Authorization (EUA).  This EUA will remain in effect (meaning this test can be used) for the duration of the COVID-19 declaration under Section 564(b)(1) of the Act, 21 U.S.C. section 360bbb-3(b)(1), unless the authorization is terminated or revoked sooner.  Performed at [facility], [address] [place], [place] [address]   MRSA Next Gen by PCR, Nasal     Status: None    Collection Time: 01/01/23 10:13 PM   Specimen: Nasal Mucosa; Nasal Swab  Result Value Ref Range Status   MRSA by PCR Next Gen NOT DETECTED NOT DETECTED Final    Comment: (NOTE) The GeneXpert MRSA Assay (FDA approved for NASAL specimens only), is one component of a comprehensive MRSA colonization surveillance program. It is not intended to diagnose MRSA infection nor to guide or monitor treatment for MRSA infections. Test performance is not FDA approved in patients less than 76 years old. Performed at [facility], [address] [place], [place] [address]   Blood Culture (routine x 2)     Status: None (Preliminary result)   Collection Time: 01/02/23  8:12 AM   Specimen: BLOOD  Result Value Ref Range Status   Specimen Description BLOOD BLOOD LEFT ARM  Final   Special Requests   Final    BOTTLES DRAWN AEROBIC AND ANAEROBIC Blood Culture adequate volume   Culture   Final    NO GROWTH 4 DAYS Performed at [facility], [address] [place], [place] [address]    Report Status PENDING  Incomplete     Labs: Basic Metabolic Panel: Recent Labs  Lab 01/02/23 0702 01/02/23 0805 01/02/23 1148 01/03/23 0345 01/03/23 0843 01/03/23 1414 01/03/23 1758 01/04/23 0359 01/05/23 0546 01/06/23 0254  NA 140 137  --  143  --   --   --  140 138 139  K 5.7* 5.7*   < > 5.5*   < > 5.3* 5.2* 4.6 4.6 4.8  CL 103 101  --  103  --   --   --  100 100 101  CO2 29 28  --  31  --   --   --  31 30 30   GLUCOSE 64* 236*  --  151*  --   --   --  106* 169* 214*  BUN 46* 45*  --  47*  --   --   --  49* 58* 61*  CREATININE 1.74* 1.84*  --  1.95*  --   --   --  1.89* 1.98* 1.57*  CALCIUM 9.2 8.9  --  9.5  --   --   --  9.0 8.7* 8.6*  MG 2.3  --   --  2.3  --   --   --  2.4 2.4 2.4  PHOS 2.0*  --   --  4.3  --   --   --  4.0 3.5 3.1   < > = values in this interval not displayed.   Liver Function Tests: Recent Labs  Lab 01/01/23 1656  AST 14*  ALT 14  ALKPHOS 29*  BILITOT 0.6   PROT 6.7  ALBUMIN 3.5   No results for input(s): "LIPASE", "AMYLASE" in the last 168 hours. No results for input(s): "AMMONIA" in the last 168 hours. CBC: Recent Labs  Lab 01/01/23 1656 01/02/23 5784 01/03/23 0345 01/04/23 0359 01/05/23 6962  01/06/23 0254  WBC 4.8 5.7 5.8 8.9 7.0 5.9  NEUTROABS 3.6  --   --   --   --   --   HGB 12.0* 11.1* 12.1* 11.9* 11.4* 11.4*  HCT 42.3 37.6* 39.2 39.7 37.7* 37.5*  MCV 106.3* 99.7 97.8 100.0 99.7 98.7  PLT 133* 119* 131* 136* 127* 129*   Cardiac Enzymes: No results for input(s): "CKTOTAL", "CKMB", "CKMBINDEX", "TROPONINI" in the last 168 hours. BNP: BNP (last 3 results) Recent Labs    01/01/23 1656  BNP 336.4*    ProBNP (last 3 results) No results for input(s): "PROBNP" in the last 8760 hours.  CBG: Recent Labs  Lab 01/05/23 1946 01/06/23 0002 01/06/23 0045 01/06/23 0429 01/06/23 0804  GLUCAP 267* 271* 251* 148* 96       Signed:  Silvano Bilis MD.  [facility] 01/06/2023, 11:18 AM

## 2023-01-06 NOTE — Progress Notes (Signed)
Pt discharged from hospital with personal belongings and IV's removed. Pt hoyered to his personal wheelchair and his personal healthcare team provided transportation to medical mall entrance. Antibiotics sent to pharmacy per MD and dc education provided by nurse to family and caretakers.

## 2023-01-06 NOTE — TOC Transition Note (Signed)
Transition of Care [facility]) - CM/SW Discharge Note   Patient Details  Name: Troy Mueller MRN: [id number] Date of Birth: 1932-03-19  Transition of Care [facility]) CM/SW Contact:  Hetty Ely, RN Phone Number: 01/06/2023, 11:48 AM   Clinical Narrative:  To discharge home with private paid caretakers, spoke with Rochele Raring. [facility]EM[facility] transport arranged. [facility] called spoke with [facility], to resume Home Hospice services.    Final next level of care: Home w Hospice Care Barriers to Discharge: Barriers Resolved   Patient Goals and CMS Choice      Discharge Placement                    Name of family member notified: Rochele Raring Patient and family notified of of transfer: 01/06/23  Discharge Plan and Services Additional resources added to the After Visit Summary for                  DME Arranged: N/A DME Agency: NA         HH Agency: Other - See comment [facility]) Date [facility] Agency Contacted: 01/06/23 Time HH Agency Contacted: 1145 Representative spoke with at [facility] Agency: [facility]  Social Determinants of Health (SDOH) Interventions SDOH Screenings   Food Insecurity: No Food Insecurity (01/01/2023)  Housing: Low Risk  (01/01/2023)  Transportation Needs: No Transportation Needs (01/01/2023)  Utilities: Not At Risk (01/01/2023)  Financial Resource Strain: Low Risk  (08/05/2022)   Received from [facility]  Social Connections: Somewhat Isolated (02/13/2019)   Received from [facility], [facility]  Tobacco Use: Low Risk  (01/01/2023)     Readmission Risk Interventions     No data to display

## 2023-01-06 NOTE — Plan of Care (Signed)
  Problem: Education: Goal: Ability to describe self-care measures that may prevent or decrease complications (Diabetes Survival Skills Education) will improve Outcome: Progressing   Problem: Coping: Goal: Ability to adjust to condition or change in health will improve Outcome: Progressing   Problem: Fluid Volume: Goal: Ability to maintain a balanced intake and output will improve Outcome: Progressing   Problem: Skin Integrity: Goal: Risk for impaired skin integrity will decrease Outcome: Progressing   Problem: Tissue Perfusion: Goal: Adequacy of tissue perfusion will improve Outcome: Progressing   Problem: Health Behavior/Discharge Planning: Goal: Ability to manage health-related needs will improve Outcome: Progressing

## 2023-01-06 NOTE — Plan of Care (Signed)

## 2023-01-06 NOTE — Progress Notes (Signed)
Speech Language Pathology Treatment: Dysphagia  Patient Details Name: Troy Mueller MRN: [id number] DOB: 03/18/1932 Today's Date: 01/06/2023 Time: 0930-1030 SLP Time Calculation (min) (ACUTE ONLY): 60 min  Assessment / Plan / Recommendation Clinical Impression  Pt seen for ongoing assessment of swallowing; education w/ pt and caregivers/family. Pt awake/alert this morning. He engaged in verbal conversation; slower processing/responses noted. Speech was intelligible; vocal quality (of cords) much improved in loudness and less strained/hoarse since extubation. Cough was strong. Personal caregivers present.  On [place] o2 support 1.5--2L; afebrile. WBC WNL.   Pt explained general aspiration precautions and agreed verbally to the need for following them especially sitting Upright for all oral intake; need for Small bites/sips Slowly. Supported for more upright sitting for swallowing of Pills in Puree w/ NSG. Pt consumed po's w/ no overt deficits noted per NSG. Oral phase appeared [facility] for bolus management and timely A-P transfer for swallowing; oral clearing followed. NSG denied any deficits in swallowing of purees prior. Caregivers reported coughing when sipping coffee during breakfast meal this morning 1x -- this occurred at the END of the meal per their report.  Extensive education given on aspiration precautions and supportive strategies during oral intake.    Pt appears improving in medical/respiratory status overall per MD/Family. He appears to present w/ reduced risk for aspiration/aspiration pneumonia when following general aspiration precautions and supportive strategies for conservation of energy during oral intake in order to decreased WOB/SOB and lessen risk for aspiration. Recommend a more mech soft diet(caregiver stated foods are cut for him at home Baseline) for ease of soft foods w/ gravies added to moisten foods; Thin liquids VIA CUP(pt does not use straws Baseline). Recommend precautions  including small bites/sips slowly; No talking/distractions during meals; upright sitting. Pills Whole in Puree moving forward for safety of swallow.  MD/NSG updated, agreed. Precautions posted at bedside and provided to personal caregivers for home use; information on a Dysphagia Drink Cup given for order at home. Pt/caregivers/Daughter had no further questions.       HPI HPI: Pt is a 89 y.o with significant PMH of pAF on apixaban, HTN, hypertriglyceridemia, T2DM, severe LVH, h/o DVT/PE s/p IVC filter, hypothyroidism, and CKD stage III, and subdural hematoma.  On arrival to ED, he remained unresponsive and found to have acute respiratory acidosis with profound hypercapnia and was subsequently intubated for airway protection.  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  He wishes to reamain a Full Code status.   Chest CT: Bilateral lower lobe consolidation with associated small effusions  similar to that seen on prior plain film.      SLP Plan  All goals met      Recommendations for follow up therapy are one component of a multi-disciplinary discharge planning process, led by the attending physician.  Recommendations may be updated based on patient status, additional functional criteria and insurance authorization.    Recommendations  Diet recommendations: Regular;Dysphagia 3 (mechanical soft);Thin liquid (cut meats; moistened foods; avoid Salads at this time) Liquids provided via: Cup;No straw Medication Administration: Whole meds with puree Supervision: Patient able to self feed;Staff to assist with self feeding;Full supervision/cueing for compensatory strategies (can hold cup to drink) Compensations: Minimize environmental distractions;Slow rate;Lingual sweep for  clearance of pocketing;Small sips/bites;Follow solids with liquid  Postural Changes and/or Swallow Maneuvers: Out of bed for meals;Seated upright 90 degrees;Upright 30-60 min after meal                 (Dietician f/u) Oral care BID;Oral care before and after PO;Patient independent with oral care (support)   Frequent or constant Supervision/Assistance (d/t comorbidities baseline) Dysphagia, unspecified (R13.10) (baseline decline)     All goals met        Troy Som, MS, CCC-SLP Speech Language Pathologist Rehab Services; [facility] [phone] (ascom) Troy Mueller  01/06/2023, 4:34 PM

## 2023-01-07 LAB — CULTURE, BLOOD (ROUTINE X 2)
Culture: NO GROWTH
Special Requests: ADEQUATE

## 2023-01-12 ENCOUNTER — Emergency Department

## 2023-01-12 ENCOUNTER — Inpatient Hospital Stay
Admission: EM | Admit: 2023-01-12 | Discharge: 2023-01-15 | DRG: 189 | Disposition: A | Discharge status: Hospice | Attending: Internal Medicine | Admitting: Internal Medicine

## 2023-01-12 ENCOUNTER — Other Ambulatory Visit: Payer: Self-pay

## 2023-01-12 DIAGNOSIS — J9601 Acute respiratory failure with hypoxia: Secondary | ICD-10-CM | POA: Diagnosis not present

## 2023-01-12 DIAGNOSIS — E785 Hyperlipidemia, unspecified: Secondary | ICD-10-CM | POA: Insufficient documentation

## 2023-01-12 DIAGNOSIS — Z6839 Body mass index (BMI) 39.0-39.9, adult: Secondary | ICD-10-CM

## 2023-01-12 DIAGNOSIS — J9621 Acute and chronic respiratory failure with hypoxia: Secondary | ICD-10-CM | POA: Diagnosis present

## 2023-01-12 DIAGNOSIS — G4733 Obstructive sleep apnea (adult) (pediatric): Secondary | ICD-10-CM | POA: Insufficient documentation

## 2023-01-12 DIAGNOSIS — J9811 Atelectasis: Secondary | ICD-10-CM | POA: Diagnosis present

## 2023-01-12 DIAGNOSIS — Z79899 Other long term (current) drug therapy: Secondary | ICD-10-CM

## 2023-01-12 DIAGNOSIS — E039 Hypothyroidism, unspecified: Secondary | ICD-10-CM

## 2023-01-12 DIAGNOSIS — J9602 Acute respiratory failure with hypercapnia: Principal | ICD-10-CM

## 2023-01-12 DIAGNOSIS — N1831 Chronic kidney disease, stage 3a: Secondary | ICD-10-CM | POA: Diagnosis present

## 2023-01-12 DIAGNOSIS — I482 Chronic atrial fibrillation, unspecified: Secondary | ICD-10-CM | POA: Diagnosis present

## 2023-01-12 DIAGNOSIS — Z8052 Family history of malignant neoplasm of bladder: Secondary | ICD-10-CM

## 2023-01-12 DIAGNOSIS — E1122 Type 2 diabetes mellitus with diabetic chronic kidney disease: Secondary | ICD-10-CM | POA: Diagnosis present

## 2023-01-12 DIAGNOSIS — Z86718 Personal history of other venous thrombosis and embolism: Secondary | ICD-10-CM

## 2023-01-12 DIAGNOSIS — I4891 Unspecified atrial fibrillation: Secondary | ICD-10-CM | POA: Diagnosis present

## 2023-01-12 DIAGNOSIS — J9622 Acute and chronic respiratory failure with hypercapnia: Secondary | ICD-10-CM | POA: Diagnosis present

## 2023-01-12 DIAGNOSIS — Z794 Long term (current) use of insulin: Secondary | ICD-10-CM

## 2023-01-12 DIAGNOSIS — D631 Anemia in chronic kidney disease: Secondary | ICD-10-CM | POA: Diagnosis present

## 2023-01-12 DIAGNOSIS — Z1152 Encounter for screening for COVID-19: Secondary | ICD-10-CM

## 2023-01-12 DIAGNOSIS — E8729 Other acidosis: Secondary | ICD-10-CM | POA: Diagnosis present

## 2023-01-12 DIAGNOSIS — Z7401 Bed confinement status: Secondary | ICD-10-CM

## 2023-01-12 DIAGNOSIS — I129 Hypertensive chronic kidney disease with stage 1 through stage 4 chronic kidney disease, or unspecified chronic kidney disease: Secondary | ICD-10-CM | POA: Diagnosis present

## 2023-01-12 DIAGNOSIS — Z888 Allergy status to other drugs, medicaments and biological substances status: Secondary | ICD-10-CM

## 2023-01-12 DIAGNOSIS — Z7901 Long term (current) use of anticoagulants: Secondary | ICD-10-CM

## 2023-01-12 DIAGNOSIS — Z86711 Personal history of pulmonary embolism: Secondary | ICD-10-CM

## 2023-01-12 DIAGNOSIS — E1121 Type 2 diabetes mellitus with diabetic nephropathy: Secondary | ICD-10-CM

## 2023-01-12 DIAGNOSIS — Z7984 Long term (current) use of oral hypoglycemic drugs: Secondary | ICD-10-CM

## 2023-01-12 DIAGNOSIS — E875 Hyperkalemia: Secondary | ICD-10-CM | POA: Diagnosis present

## 2023-01-12 DIAGNOSIS — E662 Morbid (severe) obesity with alveolar hypoventilation: Secondary | ICD-10-CM | POA: Diagnosis present

## 2023-01-12 DIAGNOSIS — R001 Bradycardia, unspecified: Secondary | ICD-10-CM | POA: Diagnosis present

## 2023-01-12 DIAGNOSIS — I10 Essential (primary) hypertension: Secondary | ICD-10-CM

## 2023-01-12 DIAGNOSIS — E1129 Type 2 diabetes mellitus with other diabetic kidney complication: Secondary | ICD-10-CM | POA: Diagnosis present

## 2023-01-12 DIAGNOSIS — Z79890 Hormone replacement therapy (postmenopausal): Secondary | ICD-10-CM

## 2023-01-12 LAB — URINALYSIS, W/ REFLEX TO CULTURE (INFECTION SUSPECTED)
Bilirubin Urine: NEGATIVE
Glucose, UA: NEGATIVE mg/dL
Hgb urine dipstick: NEGATIVE
Ketones, ur: NEGATIVE mg/dL
Leukocytes,Ua: NEGATIVE
Nitrite: NEGATIVE
Protein, ur: NEGATIVE mg/dL
Specific Gravity, Urine: 1.013 (ref 1.005–1.030)
Squamous Epithelial / HPF: 0 /[HPF] (ref 0–5)
pH: 5 (ref 5.0–8.0)

## 2023-01-12 LAB — CBC
HCT: 38.4 % — ABNORMAL LOW (ref 39.0–52.0)
Hemoglobin: 11.3 g/dL — ABNORMAL LOW (ref 13.0–17.0)
MCH: 30 pg (ref 26.0–34.0)
MCHC: 29.4 g/dL — ABNORMAL LOW (ref 30.0–36.0)
MCV: 101.9 fL — ABNORMAL HIGH (ref 80.0–100.0)
Platelets: 132 10*3/uL — ABNORMAL LOW (ref 150–400)
RBC: 3.77 MIL/uL — ABNORMAL LOW (ref 4.22–5.81)
RDW: 13.7 % (ref 11.5–15.5)
WBC: 4.2 10*3/uL (ref 4.0–10.5)
nRBC: 0 % (ref 0.0–0.2)

## 2023-01-12 LAB — COMPREHENSIVE METABOLIC PANEL
ALT: 20 U/L (ref 0–44)
AST: 19 U/L (ref 15–41)
Albumin: 3.4 g/dL — ABNORMAL LOW (ref 3.5–5.0)
Alkaline Phosphatase: 35 U/L — ABNORMAL LOW (ref 38–126)
Anion gap: 4 — ABNORMAL LOW (ref 5–15)
BUN: 37 mg/dL — ABNORMAL HIGH (ref 8–23)
CO2: 35 mmol/L — ABNORMAL HIGH (ref 22–32)
Calcium: 8.8 mg/dL — ABNORMAL LOW (ref 8.9–10.3)
Chloride: 98 mmol/L (ref 98–111)
Creatinine, Ser: 1.25 mg/dL — ABNORMAL HIGH (ref 0.61–1.24)
GFR, Estimated: 55 mL/min — ABNORMAL LOW (ref >60)
Glucose, Bld: 102 mg/dL — ABNORMAL HIGH (ref 70–99)
Potassium: 5.8 mmol/L — ABNORMAL HIGH (ref 3.5–5.1)
Sodium: 137 mmol/L (ref 135–145)
Total Bilirubin: 0.7 mg/dL (ref 0.3–1.2)
Total Protein: 6.8 g/dL (ref 6.5–8.1)

## 2023-01-12 LAB — BLOOD GAS, VENOUS
Acid-Base Excess: 8.3 mmol/L — ABNORMAL HIGH (ref 0.0–2.0)
Acid-Base Excess: 3.2 mmol/L — ABNORMAL HIGH (ref 0.0–2.0)
Bicarbonate: 38 mmol/L — ABNORMAL HIGH (ref 20.0–28.0)
Bicarbonate: 30.9 mmol/L — ABNORMAL HIGH (ref 20.0–28.0)
O2 Saturation: 73.6 %
O2 Saturation: 81.9 %
Patient temperature: 37
Patient temperature: 37
pCO2, Ven: 79 mm[Hg] (ref 44–60)
pCO2, Ven: 60 mm[Hg] (ref 44–60)
pH, Ven: 7.29 (ref 7.25–7.43)
pH, Ven: 7.32 (ref 7.25–7.43)
pO2, Ven: 42 mm[Hg] (ref 32–45)
pO2, Ven: 46 mm[Hg] — ABNORMAL HIGH (ref 32–45)

## 2023-01-12 LAB — RESP PANEL BY RT-PCR (RSV, FLU A&B, COVID)  RVPGX2
Influenza A by PCR: NEGATIVE
Influenza B by PCR: NEGATIVE
Resp Syncytial Virus by PCR: NEGATIVE
SARS Coronavirus 2 by RT PCR: NEGATIVE

## 2023-01-12 LAB — LACTIC ACID, PLASMA
Lactic Acid, Venous: 0.7 mmol/L (ref 0.5–1.9)
Lactic Acid, Venous: 0.7 mmol/L (ref 0.5–1.9)

## 2023-01-12 LAB — CBG MONITORING, ED: Glucose-Capillary: 97 mg/dL (ref 70–99)

## 2023-01-12 LAB — AMMONIA: Ammonia: 34 umol/L (ref 9–35)

## 2023-01-12 MED ORDER — VANCOMYCIN HCL IN DEXTROSE 1-5 GM/200ML-% IV SOLN
1000.0000 mg | Freq: Once | INTRAVENOUS | Status: AC
  Administered 2023-01-12: 1000 mg via INTRAVENOUS
  Filled 2023-01-12: qty 200

## 2023-01-12 MED ORDER — ONDANSETRON HCL 4 MG/2ML IJ SOLN: 4.0000 mg | Freq: Four times a day (QID) | INTRAMUSCULAR | Status: DC | PRN

## 2023-01-12 MED ORDER — HYDROCOD POLI-CHLORPHE POLI ER 10-8 MG/5ML PO SUER: 5.0000 mL | Freq: Two times a day (BID) | ORAL | Status: DC | PRN

## 2023-01-12 MED ORDER — FENOFIBRATE 54 MG PO TABS
54.0000 mg | ORAL_TABLET | Freq: Every day | ORAL | Status: DC
  Administered 2023-01-13 – 2023-01-15 (×3): 54 mg via ORAL
  Filled 2023-01-12 (×3): qty 1

## 2023-01-12 MED ORDER — GLIPIZIDE ER 5 MG PO TB24
5.0000 mg | ORAL_TABLET | Freq: Every day | ORAL | Status: DC
  Administered 2023-01-13 – 2023-01-15 (×3): 5 mg via ORAL
  Filled 2023-01-12 (×3): qty 1

## 2023-01-12 MED ORDER — SODIUM CHLORIDE 0.9 % IV SOLN
500.0000 mg | Freq: Once | INTRAVENOUS | Status: AC
  Administered 2023-01-12: 500 mg via INTRAVENOUS
  Filled 2023-01-12: qty 5

## 2023-01-12 MED ORDER — ROSUVASTATIN CALCIUM 10 MG PO TABS
10.0000 mg | ORAL_TABLET | Freq: Every day | ORAL | Status: DC
  Administered 2023-01-13 – 2023-01-15 (×3): 10 mg via ORAL
  Filled 2023-01-12 (×3): qty 1

## 2023-01-12 MED ORDER — LEVOTHYROXINE SODIUM 25 MCG PO TABS
25.0000 ug | ORAL_TABLET | Freq: Every day | ORAL | Status: DC
  Administered 2023-01-14 – 2023-01-15 (×2): 25 ug via ORAL
  Filled 2023-01-12 (×2): qty 1

## 2023-01-12 MED ORDER — APIXABAN 5 MG PO TABS
5.0000 mg | ORAL_TABLET | Freq: Two times a day (BID) | ORAL | Status: DC
  Administered 2023-01-12 – 2023-01-15 (×6): 5 mg via ORAL
  Filled 2023-01-12 (×6): qty 1

## 2023-01-12 MED ORDER — SODIUM CHLORIDE 0.9 % IV BOLUS (SEPSIS)
1000.0000 mL | Freq: Once | INTRAVENOUS | Status: AC
  Administered 2023-01-12: 1000 mL via INTRAVENOUS

## 2023-01-12 MED ORDER — AMLODIPINE BESYLATE 5 MG PO TABS
5.0000 mg | ORAL_TABLET | Freq: Every day | ORAL | Status: DC
  Administered 2023-01-13 – 2023-01-15 (×3): 5 mg via ORAL
  Filled 2023-01-12 (×3): qty 1

## 2023-01-12 MED ORDER — IPRATROPIUM-ALBUTEROL 0.5-2.5 (3) MG/3ML IN SOLN
3.0000 mL | RESPIRATORY_TRACT | Status: DC | PRN
  Administered 2023-01-15: 3 mL via RESPIRATORY_TRACT

## 2023-01-12 MED ORDER — EZETIMIBE 10 MG PO TABS
10.0000 mg | ORAL_TABLET | Freq: Every day | ORAL | Status: DC
  Administered 2023-01-13 – 2023-01-15 (×3): 10 mg via ORAL
  Filled 2023-01-12 (×3): qty 1

## 2023-01-12 MED ORDER — SODIUM CHLORIDE 0.9 % IV SOLN
2.0000 g | Freq: Once | INTRAVENOUS | Status: AC
  Administered 2023-01-12: 2 g via INTRAVENOUS
  Filled 2023-01-12: qty 12.5

## 2023-01-12 MED ORDER — IPRATROPIUM-ALBUTEROL 0.5-2.5 (3) MG/3ML IN SOLN
3.0000 mL | Freq: Four times a day (QID) | RESPIRATORY_TRACT | Status: DC
  Administered 2023-01-13: 3 mL via RESPIRATORY_TRACT
  Filled 2023-01-12 (×2): qty 3

## 2023-01-12 MED ORDER — TRAZODONE HCL 50 MG PO TABS: 25.0000 mg | ORAL_TABLET | Freq: Every evening | ORAL | Status: DC | PRN

## 2023-01-12 MED ORDER — VANCOMYCIN HCL IN DEXTROSE 1-5 GM/200ML-% IV SOLN: 1000.0000 mg | Freq: Once | INTRAVENOUS | Status: DC

## 2023-01-12 MED ORDER — CARVEDILOL 6.25 MG PO TABS
12.5000 mg | ORAL_TABLET | Freq: Two times a day (BID) | ORAL | Status: DC
  Administered 2023-01-12: 12.5 mg via ORAL
  Filled 2023-01-12: qty 2

## 2023-01-12 MED ORDER — GUAIFENESIN ER 600 MG PO TB12
600.0000 mg | ORAL_TABLET | Freq: Two times a day (BID) | ORAL | Status: DC
Start: 2023-01-12 — End: 2023-01-15
  Administered 2023-01-13 – 2023-01-15 (×5): 600 mg via ORAL
  Filled 2023-01-12 (×6): qty 1

## 2023-01-12 MED ORDER — OMEGA-3-ACID ETHYL ESTERS 1 G PO CAPS
2.0000 | ORAL_CAPSULE | Freq: Two times a day (BID) | ORAL | Status: DC
  Administered 2023-01-13 – 2023-01-15 (×5): 2 g via ORAL
  Filled 2023-01-12 (×5): qty 2

## 2023-01-12 MED ORDER — ONDANSETRON HCL 4 MG PO TABS: 4.0000 mg | ORAL_TABLET | Freq: Four times a day (QID) | ORAL | Status: DC | PRN

## 2023-01-12 MED ORDER — MAGNESIUM HYDROXIDE 400 MG/5ML PO SUSP: 30.0000 mL | Freq: Every day | ORAL | Status: DC | PRN

## 2023-01-12 NOTE — Assessment & Plan Note (Signed)
-   We will continue fenofibrate, Zetia and Lovaza.

## 2023-01-12 NOTE — Sepsis Progress Note (Signed)
Code sepsis being monitored by eLink 

## 2023-01-12 NOTE — Assessment & Plan Note (Signed)
-   We will continue anti-hypertensive therapy. 

## 2023-01-12 NOTE — Assessment & Plan Note (Signed)
-   The patient will be placed on supplemental coverage with NovoLog. - We will hold off metformin and continue Glucotrol XL.

## 2023-01-12 NOTE — Assessment & Plan Note (Addendum)
-   This could be related to obesity hypoventilation syndrome/obstructive sleep apnea. - The patient will be admitted to a progressive unit bed. - We will continue him on BiPAP overnight. - O2 protocol will be followed. - His chest x-ray findings are likely residual from a his previous admission. - She was already treated through complete course of antibiotic therapy for his pneumonia. - I will hold off on further antibiotics at this time.

## 2023-01-12 NOTE — Progress Notes (Signed)
[facility] Liaison note:   This patient is a current [facility] patient.  [facility] will continue to follow for discharge disposition.    Please call for any Hospice related questions or concerns.   [facility] Liaison [phone]

## 2023-01-12 NOTE — Consult Note (Signed)
CODE SEPSIS - PHARMACY COMMUNICATION  **Broad Spectrum Antibiotics should be administered within 1 hour of Sepsis diagnosis**  Time Code Sepsis Called/Page Received: 1707  Antibiotics Ordered: vancomycin, azithromycin, cefepime  Time of 1st antibiotic administration: 1732  Additional action taken by pharmacy: n/a  If necessary, Name of Provider/Nurse Contacted: n/a    Bettey Costa ,PharmD Clinical Pharmacist  01/12/2023  5:11 PM

## 2023-01-12 NOTE — H&P (Signed)
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  Patient was oriented only to X1 while his baseline is being oriented X4, per EMS.  He has been having increased fatigue and tiredness today and was fairly confused per his daughter and poorly responsive when they called 911.  He has an undiagnosed obstructive sleep apnea but has not been tested previously.  He has baseline bradycardia in the 40s-50s.  No chest pain or palpitations.  No worsening cough or wheezing or hemoptysis.  He was discharged on p.o. Augmentin to complete 1 week of treatment for community-acquired pneumonia.  No dysuria, oliguria or hematuria or flank pain.  No bleeding diathesis.  ED Course: Upon presentation to the emergency room, heart rate was 43 and respiratory rate was 22 and vital signs were otherwise within normal.  Blood gas revealed pCO2 of 79 and on BiPAP came down to 60 and pH was 7.29 and later 7.32 on BiPAP.  CBC showed hyperkalemia 5.8 and a CO2 35 with BUN of 37 creatinine of 1.25 better than previous levels calcium 8.8, alk phos was 35 and albumin 3.4.  Lactic acid was 0.7 twice and CBC showed anemia. EKG as  reviewed by me : EKG showed atrial fibrillation with slow ventricular sponsor 42 with poor R wave progression. Imaging: Portable chest x-ray showed the following: Low lung volumes with mild cardiomegaly and central bronchovascular crowding. Small right pleural effusion. Mild airspace disease at the bilateral lung bases, atelectasis versus pneumonia.  The patient was given IV cefepime, vancomycin and Zithromax and 1 L bolus of normal saline.  He was placed on BiPAP as mentioned above.  He will be admitted to a progressive unit bed for further evaluation and management. PAST MEDICAL HISTORY:   Past Medical History:  Diagnosis Date   Diabetes mellitus without complication (HCC)    DVT (deep venous thrombosis) (HCC)    Embolus (HCC)    HTN (hypertension)     PAST SURGICAL HISTORY:   Past Surgical History:  Procedure Laterality Date   TOTAL HIP ARTHROPLASTY      SOCIAL HISTORY:   Social History   Tobacco Use   Smoking status: Never   Smokeless tobacco: Never  Substance Use Topics   Alcohol use: Not Currently  He is retired MD.  FAMILY HISTORY:   Family History  Problem Relation Age of Onset   Bladder Cancer Brother     DRUG ALLERGIES:   Allergies  Allergen Reactions   Tylenol [Acetaminophen]     rash    REVIEW OF SYSTEMS:   ROS As per history of present illness. All pertinent systems were reviewed above. Constitutional, HEENT, cardiovascular, respiratory, GI, GU, musculoskeletal, neuro, psychiatric, endocrine, integumentary  and hematologic systems were reviewed and are otherwise negative/unremarkable except for positive findings mentioned above in the HPI.   MEDICATIONS AT HOME:   Prior to Admission medications   Medication Sig Start Date End Date Taking? Authorizing Provider  amLODipine (NORVASC) 5 MG tablet Take 5 mg by mouth daily.    [provider]  amoxicillin-clavulanate (AUGMENTIN) 875-125 MG tablet Take 1 tablet by mouth 2 (two) times daily.  01/06/23   Wouk, Wilfred Curtis, MD  carvedilol (COREG) 12.5 MG tablet Take 12.5 mg by mouth 2 (two) times daily. 10/15/21   [provider]  ELIQUIS 5 MG TABS tablet Take 1 tablet (5 mg total) by mouth 2 (two) times daily. Home med. 02/01/22   Darlin Priestly, MD  ezetimibe (ZETIA) 10 MG tablet Take 10 mg by mouth daily.    [provider]  fenofibrate 54 MG tablet Take 54 mg by mouth daily.    [provider]  glipiZIDE (GLUCOTROL XL) 5 MG 24 hr tablet Take 5 mg by mouth daily.    [provider]  JUBLIA 10 % SOLN Apply topically daily. 08/11/21   [provider]  levothyroxine (SYNTHROID) 25 MCG tablet Take 25 mcg by mouth every morning.    [provider]  metFORMIN (GLUCOPHAGE-XR) 500 MG 24 hr tablet 500 mg 2 (two) times daily with a meal.    [provider]  omega-3 acid ethyl esters (LOVAZA) 1 g capsule Take 2 capsules by mouth 2 (two) times daily. 12/30/21   [provider]  PREVIDENT 5000 PLUS 1.1 % CREA dental cream Place 1 Application onto teeth at bedtime. 01/12/22   [provider]  rosuvastatin (CRESTOR) 10 MG tablet Take 1 tablet by mouth daily. 12/09/21   [provider]      VITAL SIGNS:  Blood pressure 138/64, pulse (!) 54, temperature 97.8 F (36.6 C), temperature source Oral, resp. rate 17, height 5\' 11"  (1.803 m), weight 128 kg, SpO2 98%.  PHYSICAL EXAMINATION:  Physical Exam  GENERAL: Acutely ill 89 y.o.-year-old patient lying in the bed with mild respiratory distress with conversational dyspnea on BiPAP EYES: Pupils equal, round, reactive to light and accommodation. No scleral icterus. Extraocular muscles intact.  HEENT: Head atraumatic, normocephalic. Oropharynx and nasopharynx clear.  NECK:  Supple, no jugular venous distention. No thyroid enlargement, no tenderness.  LUNGS: Diminished bibasilar breath sounds with no wheezing, rales,rhonchi or crepitation. No use of accessory muscles of  respiration.  CARDIOVASCULAR: Regular rate and rhythm, S1, S2 normal. No murmurs, rubs, or gallops.  ABDOMEN: Soft, nondistended, nontender. Bowel sounds present. No organomegaly or mass.  EXTREMITIES: No pedal edema, cyanosis, or clubbing.  NEUROLOGIC: Cranial nerves II through XII are intact. Muscle strength 5/5 in all extremities. Sensation intact. Gait not checked.  PSYCHIATRIC: The patient is alert and oriented x 3.  Normal affect and good eye contact. SKIN: No obvious rash, lesion, or ulcer.   LABORATORY PANEL:   CBC Recent Labs  Lab 01/12/23 1642  WBC 4.2  HGB 11.3*  HCT 38.4*  PLT 132*   ------------------------------------------------------------------------------------------------------------------  Chemistries  Recent Labs  Lab 01/06/23 0254 01/12/23 1642  NA 139 137  K 4.8 5.8*  CL 101 98  CO2 30 35*  GLUCOSE 214* 102*  BUN 61* 37*  CREATININE 1.57* 1.25*  CALCIUM 8.6* 8.8*  MG 2.4  --   AST  --  19  ALT  --  20  ALKPHOS  --  35*  BILITOT  --  0.7   ------------------------------------------------------------------------------------------------------------------  Cardiac Enzymes No results for input(s): "TROPONINI" in the last 168 hours. ------------------------------------------------------------------------------------------------------------------  RADIOLOGY:  DG Chest Port 1 View  Result Date: 01/12/2023 CLINICAL DATA:  Possible sepsis EXAM: PORTABLE CHEST 1 VIEW COMPARISON:  01/01/2023 FINDINGS: Low lung volumes. Mild cardiomegaly with central bronchovascular crowding. Small right-sided pleural effusion. Mild airspace disease at the bilateral lung bases. No pneumothorax IMPRESSION: Low lung volumes with mild cardiomegaly and central bronchovascular crowding. Small right pleural effusion. Mild airspace disease at the bilateral lung bases, atelectasis versus pneumonia. Electronically Signed   By: Jasmine Pang M.D.   On: 01/12/2023 19:11       IMPRESSION AND PLAN:  Assessment and Plan: * Acute respiratory failure with hypoxia and hypercapnia (HCC) - This could be related to obesity hypoventilation syndrome/obstructive sleep apnea. - The patient will be admitted to a progressive unit bed. - We will continue him on BiPAP overnight. - O2 protocol will be followed. - His chest x-ray findings are likely residual from a his previous admission. - She was already treated through complete course of antibiotic therapy for his pneumonia. - I will hold off on further antibiotics at this time.  Essential hypertension - We will continue antihypertensive therapy.  Hypothyroidism - We will continue Synthroid.  Type II diabetes mellitus with renal manifestations (HCC) - The patient will be placed on supplemental coverage with NovoLog. - We will hold off metformin and continue Glucotrol XL.  Atrial fibrillation, chronic (HCC) - We will continue Coreg and Eliquis.  Dyslipidemia - We will continue fenofibrate, Zetia and Lovaza.    DVT prophylaxis: Lovenox.  Advanced Care Planning:  Code Status: full code.  Family Communication:  The plan of care was discussed in details with the patient (and family). I answered all questions. The patient agreed to proceed with the above mentioned plan. Further management will depend upon hospital course. Disposition Plan: Back to previous home environment Consults called: none.  All the records are reviewed and case discussed with ED provider.  Status is: Observation  I certify that at the time of admission, it is my clinical judgment that the patient will require hospital care extending less than 2 midnights.                            Dispo: The patient is from: Home              Anticipated d/c is to: Home              Patient currently is not medically stable to d/c.              Difficult to place patient: No  Authorized and performed by: Valente David, MD Total critical care time:   55      minutes. Due to a high probability of clinically significant, life-threatening deterioration, the patient required my highest level of preparedness to intervene emergently and I personally spent this critical care time directly and personally managing the patient.  This critical care time included obtaining a history, examining the patient, pulse oximetry, ordering and review of studies, arranging urgent treatment with development of management plan, evaluation of patient's response to treatment, frequent reassessment, and discussions with other providers. This critical care time was performed to assess and manage the high probability of imminent, life-threatening deterioration that could result in multiorgan failure.  It was exclusive of separately billable procedures and treating other patients and teaching time.  Hannah Beat M.D on 01/12/2023 at 9:54 PM  [facility]   From 7 PM-7 AM, contact night-coverage [email or web address]  CC: Primary care physician; Kandyce Rud, MD

## 2023-01-12 NOTE — Assessment & Plan Note (Signed)
-   We will continue Coreg and Eliquis.

## 2023-01-12 NOTE — ED Notes (Signed)
Report given to richard, rn

## 2023-01-12 NOTE — ED Notes (Signed)
Lab called critical lactic 79. Md made aware.

## 2023-01-12 NOTE — ED Provider Notes (Signed)
[facility] Provider Note    Event Date/Time   First MD Initiated Contact with Patient 01/12/23 1636     (approximate)   History   Altered Mental Status   HPI  Troy Mueller is a 89 year old male with history of A-fib on apixaban, HTN, T2DM, LVH, DVT/PE, hypothyroidism presenting to the emergency department for evaluation of altered mental status.  Per EMS, patient is reportedly oriented x 4 at baseline.  He is coming from home.  Was orally oriented x 1.  Patient unsure who called EMS, but is able to tell me his name and that we are at [facility].  EMS reports a history of hyperkalemia with high ammonia and hyperglycemia previously.  I did review his discharge summary from 01/06/2023.  At that time, patient presented with acute hypoxic respiratory failure.  He was intubated for airway protection, diagnosed with a community-acquired pneumonia.    Additional history later obtained from patient's daughter and his caregiver who presented to bedside.  They report that patient was doing very well when he was initially discharged from the hospital.  However a few days later he began to have increased fatigue and today was confused and poorly responsive leading them to call 911.  They suspect the patient likely has undiagnosed sleep apnea, has not been tested previously.  Normal for patient's heart rates to be in the 40s.      Physical Exam   Triage Vital Signs: ED Triage Vitals  Encounter Vitals Group     BP 01/12/23 1645 128/75     Systolic BP Percentile --      Diastolic BP Percentile --      Pulse Rate 01/12/23 1645 (!) 43     Resp 01/12/23 1645 (!) 22     Temp 01/12/23 1638 97.8 F (36.6 C)     Temp Source 01/12/23 1638 Oral     SpO2 01/12/23 1645 94 %     Weight 01/12/23 1634 282 lb 3 oz (128 kg)     Height 01/12/23 1634 5\' 11"  (1.803 m)     Head Circumference --      Peak Flow --      Pain Score 01/12/23 1634 0     Pain Loc --       Pain Education --      Exclude from Growth Chart --     Most recent vital signs: Vitals:   01/12/23 1930 01/12/23 2030  BP: 131/69 138/64  Pulse: (!) 54 (!) 54  Resp: 17 17  Temp:    SpO2: 100% 98%     General: Somewhat somnolent but arousable and interactive CV:  Bradycardic with regular rhythm, normal peripheral perfusion Resp:  Lung sounds coarse bilaterally, respirations slightly labored Abd:  Soft, nondistended, no appreciable tenderness Neuro:  No gross facial asymmetry, symmetric weakness of the upper extremities.  Significant symmetric weakness of the bilateral lower extremities, family reports that patient is nonambulatory at baseline, able to tell me his name and that we are in the hospital, thinks the month is November, not able to tell me the year   ED Results / Procedures / Treatments   Labs (all labs ordered are listed, but only abnormal results are displayed) Labs Reviewed  COMPREHENSIVE METABOLIC PANEL - Abnormal; Notable for the following components:      Result Value   Potassium 5.8 (*)    CO2 35 (*)    Glucose, Bld 102 (*)  BUN 37 (*)    Creatinine, Ser 1.25 (*)    Calcium 8.8 (*)    Albumin 3.4 (*)    Alkaline Phosphatase 35 (*)    GFR, Estimated 55 (*)    Anion gap 4 (*)    All other components within normal limits  CBC - Abnormal; Notable for the following components:   RBC 3.77 (*)    Hemoglobin 11.3 (*)    HCT 38.4 (*)    MCV 101.9 (*)    MCHC 29.4 (*)    Platelets 132 (*)    All other components within normal limits  BLOOD GAS, VENOUS - Abnormal; Notable for the following components:   pCO2, Ven 79 (*)    Bicarbonate 38.0 (*)    Acid-Base Excess 8.3 (*)    All other components within normal limits  URINALYSIS, W/ REFLEX TO CULTURE (INFECTION SUSPECTED) - Abnormal; Notable for the following components:   Color, Urine YELLOW (*)    APPearance HAZY (*)    Bacteria, UA RARE (*)    All other components within normal limits  BLOOD GAS,  VENOUS - Abnormal; Notable for the following components:   pO2, Ven 46 (*)    Bicarbonate 30.9 (*)    Acid-Base Excess 3.2 (*)    All other components within normal limits  RESP PANEL BY RT-PCR (RSV, FLU A&B, COVID)  RVPGX2  CULTURE, BLOOD (ROUTINE X 2)  CULTURE, BLOOD (ROUTINE X 2) W REFLEX TO ID PANEL  AMMONIA  LACTIC ACID, PLASMA  LACTIC ACID, PLASMA  CBG MONITORING, ED     EKG EKG independently reviewed interpreted by myself (ER attending) demonstrates:  EKG demonstrates A-fib at a rate of 42, QRS 97, QTc 401, no acute ST changes noted  RADIOLOGY Imaging independently reviewed and interpreted by myself demonstrates:  Chest x-Nima Bamburg with bilateral effusions on my review, patchy consolidations slightly worsened compared to prior   PROCEDURES:  Critical Care performed: Yes, see critical care procedure note(s)  CRITICAL CARE Performed by: Trinna Post   Total critical care time: 37 minutes  Critical care time was exclusive of separately billable procedures and treating other patients.  Critical care was necessary to treat or prevent imminent or life-threatening deterioration.  Critical care was time spent personally by me on the following activities: development of treatment plan with patient and/or surrogate as well as nursing, discussions with consultants, evaluation of patient's response to treatment, examination of patient, obtaining history from patient or surrogate, ordering and performing treatments and interventions, ordering and review of laboratory studies, ordering and review of radiographic studies, pulse oximetry and re-evaluation of patient's condition.   Procedures   MEDICATIONS ORDERED IN ED: Medications  vancomycin (VANCOCIN) IVPB 1000 mg/200 mL premix (1,000 mg Intravenous New Bag/Given 01/12/23 2032)    And  vancomycin (VANCOCIN) IVPB 1000 mg/200 mL premix (1,000 mg Intravenous New Bag/Given 01/12/23 1951)  sodium chloride 0.9 % bolus 1,000 mL (0 mLs  Intravenous Stopped 01/12/23 1838)  ceFEPIme (MAXIPIME) 2 g in sodium chloride 0.9 % 100 mL IVPB (0 g Intravenous Stopped 01/12/23 1802)  azithromycin (ZITHROMAX) 500 mg in sodium chloride 0.9 % 250 mL IVPB (0 mg Intravenous Stopped 01/12/23 2026)     IMPRESSION / MDM / ASSESSMENT AND PLAN / ED COURSE  I reviewed the triage vital signs and the nursing notes.  Differential diagnosis includes, but is not limited to, confusion due to recurrent hypercarbia from untreated OSA, recurrent or incompletely treated pneumonia, lower suspicion for acute intracranial process  in the absence of any new focal deficits  Patient's presentation is most consistent with acute presentation with potential threat to life or bodily function.  89 year old male presenting to the emergency department for evaluation of altered mental status tachypneic on presentation.  With recent admission for respiratory failure secondary to suspected pneumonia, did initiate sepsis orders with 1 L of IV fluid.  Given recent IV antibiotics, treated for respiratory infection with cefepime, azithromycin, vancomycin. Bradycardic on presentation, but not atypical for patient.  Labs with stable anemia, mild hyperkalemia at 5.8.  Stable to improved renal function.  Urine without evidence of infection.  Viral swab negative.  Lactate normal.  Initial blood gas did demonstrate a respiratory acidosis with a pH of 7.29 and a pCO2 of 79.  Patient was placed on BiPAP and repeat gas was obtained approximately an hour later with improvement in pH to 7.32 with pCO2 of 60.  Patient did seem to have improvement in his mental status with this as well.  Do think he is appropriate for admission for further evaluation.  Will reach out to hospitalist team.  Case reviewed with Dr. Arville Care.  Patient was admitted for further evaluation and management.     FINAL CLINICAL IMPRESSION(S) / ED DIAGNOSES   Final diagnoses:  Acute respiratory failure with hypercapnia (HCC)      Rx / DC Orders   ED Discharge Orders     None        Note:  This document was prepared using Dragon voice recognition software and may include unintentional dictation errors.   Trinna Post, MD 01/12/23 [phone]

## 2023-01-12 NOTE — ED Triage Notes (Signed)
Pt BIB [facility] from home. EMS sts that pt is normally A/Ox4 however pt is A/O x1 at this time. EMS sts that pt has hyper K with high ammonia and hyperglycemia. Pt has a 20g in the RFA.

## 2023-01-12 NOTE — Assessment & Plan Note (Signed)
-   We will continue Synthroid. 

## 2023-01-13 DIAGNOSIS — J9602 Acute respiratory failure with hypercapnia: Secondary | ICD-10-CM | POA: Diagnosis present

## 2023-01-13 DIAGNOSIS — E1122 Type 2 diabetes mellitus with diabetic chronic kidney disease: Secondary | ICD-10-CM | POA: Diagnosis present

## 2023-01-13 DIAGNOSIS — Z7901 Long term (current) use of anticoagulants: Secondary | ICD-10-CM | POA: Diagnosis not present

## 2023-01-13 DIAGNOSIS — Z6839 Body mass index (BMI) 39.0-39.9, adult: Secondary | ICD-10-CM | POA: Diagnosis not present

## 2023-01-13 DIAGNOSIS — Z7401 Bed confinement status: Secondary | ICD-10-CM | POA: Diagnosis not present

## 2023-01-13 DIAGNOSIS — J9621 Acute and chronic respiratory failure with hypoxia: Secondary | ICD-10-CM | POA: Diagnosis present

## 2023-01-13 DIAGNOSIS — Z888 Allergy status to other drugs, medicaments and biological substances status: Secondary | ICD-10-CM | POA: Diagnosis not present

## 2023-01-13 DIAGNOSIS — Z86718 Personal history of other venous thrombosis and embolism: Secondary | ICD-10-CM | POA: Diagnosis not present

## 2023-01-13 DIAGNOSIS — J9601 Acute respiratory failure with hypoxia: Secondary | ICD-10-CM | POA: Diagnosis present

## 2023-01-13 DIAGNOSIS — N1831 Chronic kidney disease, stage 3a: Secondary | ICD-10-CM

## 2023-01-13 DIAGNOSIS — I482 Chronic atrial fibrillation, unspecified: Secondary | ICD-10-CM

## 2023-01-13 DIAGNOSIS — Z1152 Encounter for screening for COVID-19: Secondary | ICD-10-CM | POA: Diagnosis not present

## 2023-01-13 DIAGNOSIS — R001 Bradycardia, unspecified: Secondary | ICD-10-CM | POA: Diagnosis present

## 2023-01-13 DIAGNOSIS — E8729 Other acidosis: Secondary | ICD-10-CM | POA: Diagnosis present

## 2023-01-13 DIAGNOSIS — J9622 Acute and chronic respiratory failure with hypercapnia: Secondary | ICD-10-CM | POA: Diagnosis present

## 2023-01-13 DIAGNOSIS — Z86711 Personal history of pulmonary embolism: Secondary | ICD-10-CM | POA: Diagnosis not present

## 2023-01-13 DIAGNOSIS — E662 Morbid (severe) obesity with alveolar hypoventilation: Secondary | ICD-10-CM | POA: Diagnosis present

## 2023-01-13 DIAGNOSIS — E039 Hypothyroidism, unspecified: Secondary | ICD-10-CM | POA: Diagnosis present

## 2023-01-13 DIAGNOSIS — J9811 Atelectasis: Secondary | ICD-10-CM | POA: Diagnosis present

## 2023-01-13 DIAGNOSIS — E785 Hyperlipidemia, unspecified: Secondary | ICD-10-CM | POA: Diagnosis present

## 2023-01-13 DIAGNOSIS — E875 Hyperkalemia: Secondary | ICD-10-CM | POA: Diagnosis present

## 2023-01-13 DIAGNOSIS — D631 Anemia in chronic kidney disease: Secondary | ICD-10-CM | POA: Diagnosis present

## 2023-01-13 DIAGNOSIS — Z79890 Hormone replacement therapy (postmenopausal): Secondary | ICD-10-CM | POA: Diagnosis not present

## 2023-01-13 DIAGNOSIS — G4733 Obstructive sleep apnea (adult) (pediatric): Secondary | ICD-10-CM | POA: Diagnosis not present

## 2023-01-13 DIAGNOSIS — Z8052 Family history of malignant neoplasm of bladder: Secondary | ICD-10-CM | POA: Diagnosis not present

## 2023-01-13 DIAGNOSIS — Z7984 Long term (current) use of oral hypoglycemic drugs: Secondary | ICD-10-CM | POA: Diagnosis not present

## 2023-01-13 DIAGNOSIS — I129 Hypertensive chronic kidney disease with stage 1 through stage 4 chronic kidney disease, or unspecified chronic kidney disease: Secondary | ICD-10-CM | POA: Diagnosis present

## 2023-01-13 LAB — CBC
HCT: 36.3 % — ABNORMAL LOW (ref 39.0–52.0)
Hemoglobin: 11 g/dL — ABNORMAL LOW (ref 13.0–17.0)
MCH: 30.1 pg (ref 26.0–34.0)
MCHC: 30.3 g/dL (ref 30.0–36.0)
MCV: 99.2 fL (ref 80.0–100.0)
Platelets: 134 10*3/uL — ABNORMAL LOW (ref 150–400)
RBC: 3.66 MIL/uL — ABNORMAL LOW (ref 4.22–5.81)
RDW: 13.6 % (ref 11.5–15.5)
WBC: 3.9 10*3/uL — ABNORMAL LOW (ref 4.0–10.5)
nRBC: 0 % (ref 0.0–0.2)

## 2023-01-13 LAB — BASIC METABOLIC PANEL
Anion gap: 5 (ref 5–15)
BUN: 32 mg/dL — ABNORMAL HIGH (ref 8–23)
CO2: 32 mmol/L (ref 22–32)
Calcium: 8.6 mg/dL — ABNORMAL LOW (ref 8.9–10.3)
Chloride: 101 mmol/L (ref 98–111)
Creatinine, Ser: 1.14 mg/dL (ref 0.61–1.24)
GFR, Estimated: >60 mL/min (ref >60)
Glucose, Bld: 76 mg/dL (ref 70–99)
Potassium: 5.2 mmol/L — ABNORMAL HIGH (ref 3.5–5.1)
Sodium: 138 mmol/L (ref 135–145)

## 2023-01-13 LAB — BLOOD GAS, ARTERIAL
Acid-Base Excess: 8.4 mmol/L — ABNORMAL HIGH (ref 0.0–2.0)
Bicarbonate: 34.9 mmol/L — ABNORMAL HIGH (ref 20.0–28.0)
Delivery systems: POSITIVE
Expiratory PAP: 6 cm[H2O]
FIO2: 21 %
Inspiratory PAP: 12 cm[H2O]
Mechanical Rate: 12
O2 Saturation: 90.4 %
Patient temperature: 37
pCO2 arterial: 55 mm[Hg] — ABNORMAL HIGH (ref 32–48)
pH, Arterial: 7.41 (ref 7.35–7.45)
pO2, Arterial: 57 mm[Hg] — ABNORMAL LOW (ref 83–108)

## 2023-01-13 LAB — BRAIN NATRIURETIC PEPTIDE: B Natriuretic Peptide: 193.9 pg/mL — ABNORMAL HIGH (ref 0.0–100.0)

## 2023-01-13 LAB — POTASSIUM: Potassium: 5.1 mmol/L (ref 3.5–5.1)

## 2023-01-13 MED ORDER — SENNOSIDES-DOCUSATE SODIUM 8.6-50 MG PO TABS
2.0000 | ORAL_TABLET | Freq: Two times a day (BID) | ORAL | Status: DC
  Administered 2023-01-13 – 2023-01-14 (×2): 2 via ORAL
  Filled 2023-01-13 (×4): qty 2

## 2023-01-13 MED ORDER — LORAZEPAM 2 MG/ML IJ SOLN
0.5000 mg | Freq: Once | INTRAMUSCULAR | Status: AC
  Administered 2023-01-13: 0.5 mg via INTRAVENOUS
  Filled 2023-01-13: qty 1

## 2023-01-13 MED ORDER — POLYETHYLENE GLYCOL 3350 17 G PO PACK
17.0000 g | PACK | Freq: Every day | ORAL | Status: DC
  Administered 2023-01-13 – 2023-01-15 (×3): 17 g via ORAL
  Filled 2023-01-13 (×3): qty 1

## 2023-01-13 MED ORDER — IPRATROPIUM-ALBUTEROL 0.5-2.5 (3) MG/3ML IN SOLN
3.0000 mL | Freq: Four times a day (QID) | RESPIRATORY_TRACT | Status: DC
  Administered 2023-01-13 – 2023-01-14 (×5): 3 mL via RESPIRATORY_TRACT
  Filled 2023-01-13 (×6): qty 3

## 2023-01-13 MED ORDER — PSYLLIUM 95 % PO PACK
1.0000 | PACK | Freq: Every day | ORAL | Status: DC
  Administered 2023-01-13 – 2023-01-15 (×3): 1 via ORAL
  Filled 2023-01-13 (×3): qty 1

## 2023-01-13 MED ORDER — SODIUM ZIRCONIUM CYCLOSILICATE 5 G PO PACK
5.0000 g | PACK | Freq: Once | ORAL | Status: DC
  Filled 2023-01-13: qty 1

## 2023-01-13 NOTE — ED Notes (Signed)
Per Mansy, "Ativan 0.5 mg iv Ativan and may repeat dose as needed"

## 2023-01-13 NOTE — ED Notes (Signed)
MD at bedside updating caregivers with plan of care. Pt's care givers told MD they took pt off bipap and fed pt breakfast then put pt back on bipap. Md stated pt is ok to take pt off bipap to give oral meds.

## 2023-01-13 NOTE — ED Notes (Signed)
Patient removed off bipap by RT, O2 sat 96-98% on room air.

## 2023-01-13 NOTE — ED Notes (Signed)
This RN told caregivers I had requested hospital bed but that secretary said there were none available. The caregiver told this rn that there were lots of empty beds up on the third floor where the caregiver works. This RN asked secretary to put a request in for a hospital bed even though there were none available incase that bed status changed and one could be brought to the pt. Request was put in.

## 2023-01-13 NOTE — ED Notes (Signed)
Pt's could benefit from hospital bed. This RN checked and there is non available.

## 2023-01-13 NOTE — ED Notes (Signed)
Placed sacral pad on pt due to skin breakdown on right buttocks.

## 2023-01-13 NOTE — ED Notes (Signed)
Patient care delayed due to emergency on unit

## 2023-01-13 NOTE — Progress Notes (Signed)
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  Upon arriving hospital, VBG showed severe CO2 retention, chest x-ray did not see a pneumonia.  Patient was placed on BiPAP.     Principal Problem:   Acute respiratory failure with hypoxia and hypercapnia (HCC) Active Problems:   Essential hypertension   Type II diabetes mellitus with renal manifestations (HCC)   Hypothyroidism   Atrial fibrillation, chronic (HCC)   Dyslipidemia   Assessment and Plan: * Acute respiratory failure with hypoxia and hypercapnia (HCC) Severe obstructive sleep apnea. Morbid obesity with obesity hypoventilation syndrome. I reviewed the prior charts, patient required intubation at the last admission. Patient normally is bedbound, he does not wear a CPAP.  He had altered mental status prior to admission, significant CO2 retention on VBG. Patient is still minimally responsive, will continue BiPAP for now.   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 Will continue BiPAP until patient mental status improves, will need a placed on BiPAP at nighttime. Discussed with the TOC, patient will also need outpatient NIV set up. Patient condition is still critical, will follow in the progressive unit  closely.  Type II diabetes mellitus with renal manifestations (HCC) Chronic kidney disease stage IIIa. Hyperkalemia. Appear to have a chronic hyperkalemia, will give Lokelma today. Renal function is stable. Sliding-scale insulin for diabetes.  Essential hypertension - We will continue antihypertensive therapy.  Hypothyroidism - We will continue Synthroid.  Atrial fibrillation, chronic (HCC) Significant bradycardia. Reviewed cardiac strips, patient still in atrial fibrillation, but her heart rate was running in 40s.  Discontinue Coreg, continue Eliquis.  Dyslipidemia - We will continue fenofibrate, Zetia and Lovaza.       Subjective:  Patient is minimally responsive, oxygen saturations maintained on BiPAP.   Physical Exam: Vitals:   01/13/23 0430 01/13/23 0730 01/13/23 0738 01/13/23 0830  BP: (!) 141/82 139/70  136/66  Pulse: (!) 45 (!) 52 (!) 51 (!) 54  Resp: (!) 28 (!) 29 (!) 26 (!) 21  Temp:      TempSrc:      SpO2: 99% 100% 100% 92%  Weight:      Height:       General exam: Morbidly obese, minimally responsive. Respiratory system: Decreased breathing sounds, with prolonged apneic episodes. Cardiovascular system: Appear regular with significant apnea. No JVD, murmurs, rubs, gallops or clicks. No pedal edema. Gastrointestinal system: Abdomen is nondistended, soft and nontender. No organomegaly or masses felt. Normal bowel sounds heard. Central nervous system: Drowsy and minimally responsive.. Extremities: Symmetric 5 x 5 power. Skin: No rashes, lesions or ulcers     Data Reviewed:  Reviewed prior chart from last admission, reviewed chest x-ray image and result, reviewed lab results.  Reviewed telemetry strips and EKG.  Family Communication: Could not reach daughter.  Disposition: Status is: Observation The patient will require care spanning > 2  midnights and should be moved to inpatient because: Severe respiratory failure, required BiPAP.  CRITICAL  CARE Performed by: Marrion Coy   Total critical care time: 55 minutes  Critical care time was exclusive of separately billable procedures and treating other patients.  Critical care was necessary to treat or prevent imminent or life-threatening deterioration.  Critical care was time spent personally by me on the following activities: development of treatment plan with patient and/or surrogate as well as nursing, discussions with consultants, evaluation of patient's response to treatment, examination of patient, obtaining history from patient or surrogate, ordering and performing treatments and interventions, ordering and review of laboratory studies, ordering and review of radiographic studies, pulse oximetry and re-evaluation of patient's condition.       Author: Marrion Coy, MD 01/13/2023 10:21 AM  For on call review [email or web address].

## 2023-01-13 NOTE — ED Notes (Signed)
Assisted patient on and off bedpan; new depends applied. Patient adjusted in bed and new warm blankets provided.

## 2023-01-13 NOTE — ED Notes (Signed)
This RN attempted many times between 1100 and 1310 to have pt take medications.  Pt refused multiple times or else was to sleepy to take any medications by mouth.

## 2023-01-13 NOTE — Hospital Course (Signed)
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  Upon arriving hospital, VBG showed severe CO2 retention, chest x-ray did not see a pneumonia.  Patient was placed on BiPAP.

## 2023-01-13 NOTE — Progress Notes (Signed)
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  Hospice nurse notified by caregiver that patient is less responsive and having increased difficulty breathing. Family calling EMS for patient to be evaluated.  Patient was admitted to [facility] on 10.31.24 with diagnosis of acute respiratory failure with hypoxia and hypercapnia.  Per Dr. Patric Dykes, this is a related hospital admission.   Visited patient in hospital with caregivers at bedside. Patient is very lethargic but able to arouse with repeated stimuli to take oral medications. Noted need for Bipap at discharge per MD notes. Bipap approved by Dr. Patric Dykes. Will evaluate Bipap settings overnight and arrange for delivery of Bipap tomorrow. Reviewed this plan with Onalee Hua, caregiver, as well as discussion about need for GOC conversations with patient's family.    Patient is GIP appropriate requiring IV fluids and IV antibiotics.   Vital Signs: 97.9/57/21    136/66       O2 100% Bipap   I&O- not documented   Abnormal labs: pCO2 79,60, KCL 5.2, BUN 32, CA 8.6, WBC 3.9, HGB 11.0, Plate 696, BC pending, Urine positive for rare bacteria.   Diagnostics:   Chest xray IMPRESSION: Low lung volumes with mild cardiomegaly and central bronchovascular crowding. Small right pleural effusion. Mild airspace disease at the bilateral lung bases, atelectasis versus pneumonia.   IV/PRN Meds: NS x 1, Ativan 0.5mg  IV x 1, Zithromax 500mg  IV x 1, Maxipime 2G IV x 1, Vancomycin 1G IV x 1    Problem list Assessment and Plan: * Acute respiratory failure with hypoxia and hypercapnia (HCC) - This could be related to obesity hypoventilation syndrome/obstructive sleep apnea. - The patient will be admitted to a progressive unit bed. - We will continue him on BiPAP overnight. - O2 protocol will be followed. - His chest x-ray  findings are likely residual from a his previous admission. - She was already treated through complete course of antibiotic therapy for his pneumonia. - I will hold off on further antibiotics at this time.   Essential hypertension - We will continue antihypertensive therapy.    Discharge Planning:  Ongoing  Family contact: Caregivers at bedside, and Onalee Hua, caregiver by phone.  IDT:  Updated   Goals of Care- Full Code, patient has been clear with wanting full code, including cpr and intubation. Discussed with caregiver need for further GOC discussion with family.   Glenna Fellows, BSN, RN, OCN [employer] [phone]

## 2023-01-13 NOTE — ED Notes (Signed)
Patient placed on hospital bed for comfort; tolerated well.

## 2023-01-13 NOTE — ED Notes (Signed)
Pt's caregivers asked about a hospital bed update. Informed there were no updates and that a request had been put in for one. This RN said she would bring some pillows so we can prop pt up and take pressure off  one hip at a time.

## 2023-01-13 NOTE — ED Notes (Signed)
Pt resting. Warm blanket given.

## 2023-01-14 ENCOUNTER — Encounter: Payer: Self-pay | Admitting: Internal Medicine

## 2023-01-14 DIAGNOSIS — G4733 Obstructive sleep apnea (adult) (pediatric): Secondary | ICD-10-CM | POA: Diagnosis not present

## 2023-01-14 DIAGNOSIS — J9602 Acute respiratory failure with hypercapnia: Secondary | ICD-10-CM | POA: Diagnosis not present

## 2023-01-14 DIAGNOSIS — I482 Chronic atrial fibrillation, unspecified: Secondary | ICD-10-CM | POA: Diagnosis not present

## 2023-01-14 DIAGNOSIS — J9601 Acute respiratory failure with hypoxia: Secondary | ICD-10-CM | POA: Diagnosis not present

## 2023-01-14 LAB — BASIC METABOLIC PANEL
Anion gap: 7 (ref 5–15)
BUN: 32 mg/dL — ABNORMAL HIGH (ref 8–23)
CO2: 31 mmol/L (ref 22–32)
Calcium: 8.6 mg/dL — ABNORMAL LOW (ref 8.9–10.3)
Chloride: 100 mmol/L (ref 98–111)
Creatinine, Ser: 1.3 mg/dL — ABNORMAL HIGH (ref 0.61–1.24)
GFR, Estimated: 53 mL/min — ABNORMAL LOW (ref >60)
Glucose, Bld: 105 mg/dL — ABNORMAL HIGH (ref 70–99)
Potassium: 5.2 mmol/L — ABNORMAL HIGH (ref 3.5–5.1)
Sodium: 138 mmol/L (ref 135–145)

## 2023-01-14 MED ORDER — SODIUM ZIRCONIUM CYCLOSILICATE 5 G PO PACK
5.0000 g | PACK | Freq: Once | ORAL | Status: DC
  Filled 2023-01-14: qty 1

## 2023-01-14 MED ORDER — FLUTICASONE PROPIONATE 50 MCG/ACT NA SUSP
1.0000 | Freq: Every day | NASAL | Status: DC
  Administered 2023-01-14 – 2023-01-15 (×2): 1 via NASAL
  Filled 2023-01-14: qty 16

## 2023-01-14 NOTE — Progress Notes (Signed)
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  Upon arriving hospital, VBG showed severe CO2 retention, chest x-ray did not see a pneumonia.  Patient was placed on BiPAP.     Principal Problem:   Acute respiratory failure with hypoxia and hypercapnia (HCC) Active Problems:   Essential hypertension   Type II diabetes mellitus with renal manifestations (HCC)   Hypothyroidism   Atrial fibrillation, chronic (HCC)   Dyslipidemia   OSA (obstructive sleep apnea)   Acute on chronic respiratory failure with hypoxia and hypercapnia (HCC)   Assessment and Plan:  Acute respiratory failure with hypoxia and hypercapnia (HCC) Severe obstructive sleep apnea. Morbid obesity with obesity hypoventilation syndrome. Patient required intubation at the last admission. Patient normally is bedbound, he does not wear a CPAP.  He had altered mental status prior to admission, significant CO2 retention on VBG. 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 Patient has been waking up since yesterday, able to take off BiPAP during daytime, placed on BiPAP at night.  He was tolerating BiPAP tonight.  Condition appears to be improving, home use NIV set up by TOC.   Type II diabetes mellitus with renal  manifestations (HCC) Chronic kidney disease stage IIIa. Hyperkalemia. Potassium still high today, he refused Lokelma yesterday, will give a dose today.  Check a BMP tomorrow.   Essential hypertension - We will continue antihypertensive therapy.   Hypothyroidism - We will continue Synthroid.   Atrial fibrillation, chronic (HCC) Significant bradycardia. Reviewed cardiac strips, patient still in atrial fibrillation, but her heart rate was running in 40s.  Discontinued Coreg, continue Eliquis.   Dyslipidemia - We will continue fenofibrate, Zetia and Lovaza.      Subjective:  Patient doing better today, no confusion.  Short of breath with exertion.  Physical Exam: Vitals:   01/14/23 0300 01/14/23 0500 01/14/23 0800 01/14/23 1200  BP: (!) 133/56 (!) 118/53 109/67 (!) 127/50  Pulse: (!) 47 (!) 48 (!) 57 (!) 54  Resp: (!) 23 16 20 20   Temp:  98.2 F (36.8 C)  98.2 F (36.8 C)  TempSrc:  Oral  Oral  SpO2: 96% 94% 92% 98%  Weight:      Height:       General exam: Appears calm and comfortable, morbid obese. Respiratory system: Decreased breath sounds. Respiratory effort normal. Cardiovascular system: S1 & S2 heard, RRR. No JVD, murmurs, rubs, gallops or clicks. No pedal edema. Gastrointestinal system: Abdomen is nondistended, soft and nontender. No organomegaly or masses felt. Normal bowel sounds heard. Central nervous system: Alert and oriented. No focal neurological deficits. Extremities: Symmetric 5 x 5 power. Skin: No rashes, lesions or ulcers Psychiatry: Judgement and insight appear normal. Mood & affect appropriate.    Data Reviewed:  Lab results reviewed.  Family Communication: Long discussion with patient private care team, all questions answered.  Could  not reach daughters.  Disposition: Status is: Inpatient Remains inpatient appropriate because: Severity of disease,     Time spent: 35 minutes  Author: Marrion Coy, MD 01/14/2023 1:16 PM  For on call  review [email or web address].

## 2023-01-14 NOTE — Progress Notes (Signed)
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  Hospice nurse notified by caregiver that patient is less responsive and having increased difficulty breathing. Family calling EMS for patient to be evaluated.  Patient was admitted to [facility] on 10.31.24 with diagnosis of acute respiratory failure with hypoxia and hypercapnia.  Per Dr. Patric Dykes, this is a related hospital admission.   Visited patient in hospital with caregivers at bedside. Patient lying in bed with BIPAP machine on.  Patient's RN caregivers were at the bedside.  They stated that the BIPAP machine that was delivered to the home today looked like a CPAP.  Also, they stated that they were told by the Attending MD that the patient would need a Trilogy machine.  Reached out to the  Attending who stated that he would inform them that the BIPAP machine would be appropriate to use at home instead of the Trilogy.  HL informed RN caregiver that the machine that was delivered was a combination machine and it was set on BIPAP.      Patient is GIP appropriate requiring IV fluids and IV antibiotics.   Vital Signs: 98.2,  118/553,  48,  16      O2 94% Bipap   I&O- not recorded   Abnormal labs: potassium 5.2, glucose 105, BUN 32, Creatinine 1.30,  Calcium 8.6, GFR 53   Diagnostics:   Chest xray IMPRESSION: Low lung volumes with mild cardiomegaly and central bronchovascular crowding. Small right pleural effusion. Mild airspace disease at the bilateral lung bases, atelectasis versus pneumonia. 11.1-No new diagniostics.   IV/PRN Meds: None    Problem list Assessment and Plan: Acute respiratory failure with hypoxia and hypercapnia (HCC) Severe obstructive sleep apnea. Morbid obesity with obesity hypoventilation syndrome. Patient required intubation at the last admission. Patient normally is bedbound, he does not  wear a CPAP.  He had altered mental status prior to admission, significant CO2 retention on VBG. 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 Patient has been waking up since yesterday, able to take off BiPAP during daytime, placed on BiPAP at night.  He was tolerating BiPAP tonight.  Condition appears to be improving, home use NIV set up by TOC.   Type II diabetes mellitus with renal manifestations (HCC) Chronic kidney disease stage IIIa. Hyperkalemia. Potassium still high today, he refused Lokelma yesterday, will give a dose today.  Check a BMP tomorrow.   Essential hypertension - We will continue antihypertensive therapy.   Hypothyroidism - We will continue Synthroid.   Atrial fibrillation, chronic (HCC) Significant bradycardia. Reviewed cardiac strips, patient still in atrial fibrillation, but her heart rate was running in 40s.  Discontinued Coreg, continue Eliquis.   Dyslipidemia - We will continue fenofibrate, Zetia and Lovaza.       Discharge Planning:  Ongoing   Family contact: RN caregivers at bedside   IDT:  Updated   Goals of Care- Full Code, patient has been clear with wanting full code, including cpr and intubation.      [facility] Liaison [phone]

## 2023-01-14 NOTE — ED Notes (Signed)
Informed RN bed assigned 

## 2023-01-15 DIAGNOSIS — J9621 Acute and chronic respiratory failure with hypoxia: Secondary | ICD-10-CM

## 2023-01-15 DIAGNOSIS — G4733 Obstructive sleep apnea (adult) (pediatric): Secondary | ICD-10-CM | POA: Diagnosis not present

## 2023-01-15 DIAGNOSIS — J9622 Acute and chronic respiratory failure with hypercapnia: Secondary | ICD-10-CM

## 2023-01-15 DIAGNOSIS — I482 Chronic atrial fibrillation, unspecified: Secondary | ICD-10-CM | POA: Diagnosis not present

## 2023-01-15 LAB — BLOOD GAS, VENOUS
Acid-Base Excess: 11.2 mmol/L — ABNORMAL HIGH (ref 0.0–2.0)
Bicarbonate: 35.9 mmol/L — ABNORMAL HIGH (ref 20.0–28.0)
Patient temperature: 37
pCO2, Ven: 46 mm[Hg] (ref 44–60)
pH, Ven: 7.5 — ABNORMAL HIGH (ref 7.25–7.43)
pO2, Ven: 81 mm[Hg] — ABNORMAL HIGH (ref 32–45)

## 2023-01-15 LAB — BASIC METABOLIC PANEL
Anion gap: 6 (ref 5–15)
BUN: 30 mg/dL — ABNORMAL HIGH (ref 8–23)
CO2: 31 mmol/L (ref 22–32)
Calcium: 8.7 mg/dL — ABNORMAL LOW (ref 8.9–10.3)
Chloride: 102 mmol/L (ref 98–111)
Creatinine, Ser: 1.37 mg/dL — ABNORMAL HIGH (ref 0.61–1.24)
GFR, Estimated: 50 mL/min — ABNORMAL LOW (ref >60)
Glucose, Bld: 86 mg/dL (ref 70–99)
Potassium: 4.9 mmol/L (ref 3.5–5.1)
Sodium: 139 mmol/L (ref 135–145)

## 2023-01-15 MED ORDER — ELIQUIS 5 MG PO TABS: 5.0000 mg | ORAL_TABLET | Freq: Two times a day (BID) | ORAL | Status: DC

## 2023-01-15 MED ORDER — ARTIFICIAL TEARS OPHTHALMIC OINT
TOPICAL_OINTMENT | OPHTHALMIC | Status: DC | PRN
  Filled 2023-01-15 (×2): qty 3.5

## 2023-01-15 MED ORDER — LORATADINE 10 MG PO TABS
10.0000 mg | ORAL_TABLET | Freq: Every day | ORAL | Status: DC
  Administered 2023-01-15: 10 mg via ORAL
  Filled 2023-01-15: qty 1

## 2023-01-15 MED ORDER — QUETIAPINE FUMARATE 25 MG PO TABS: 25.0000 mg | ORAL_TABLET | Freq: Every day | ORAL | Status: DC

## 2023-01-15 NOTE — Plan of Care (Signed)

## 2023-01-15 NOTE — Discharge Summary (Addendum)
Physician Discharge Summary   Patient: Troy Mueller MRN: [id number] DOB: [birth year]  Admit date:     01/12/2023  Discharge date: 01/15/23  Discharge Physician: Marrion Coy   PCP: Kandyce Rud, MD   Recommendations at discharge:   Follow-up with hospice as outpatient.  Discharge Diagnoses: Principal Problem:   Acute respiratory failure with hypoxia and hypercapnia (HCC) Active Problems:   Essential hypertension   Type II diabetes mellitus with renal manifestations (HCC)   Hypothyroidism   Atrial fibrillation, chronic (HCC)   Dyslipidemia   OSA (obstructive sleep apnea)   Acute on chronic respiratory failure with hypoxia and hypercapnia (HCC)  Resolved Problems:   * No resolved hospital problems. 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  Upon arriving hospital, VBG showed severe CO2 retention, chest x-ray did not see a pneumonia.  Patient was placed on BiPAP.    Assessment and Plan: Acute respiratory failure with hypoxia and hypercapnia (HCC) Severe obstructive sleep apnea. Morbid obesity with obesity hypoventilation syndrome. Patient required intubation at the last admission. Patient normally is bedbound, he does not wear a CPAP.  He had altered mental status prior to admission, significant CO2 retention on VBG. 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 Patient has been waking up  able to take off  BiPAP during daytime, placed on BiPAP at night.   Home use of BiPAP also delivered to the patient room, patient was able to use it without any difficulty.  At this point, medically stable for discharge.   Type II diabetes mellitus with renal manifestations (HCC) Chronic kidney disease stage IIIa. Hyperkalemia. Potassium has normalized.  Essential hypertension Resume home treatment except coreg.    Hypothyroidism - We will continue Synthroid.   Atrial fibrillation, chronic (HCC) Significant bradycardia. Reviewed cardiac strips, patient still in atrial fibrillation, but her heart rate was running in 40s.  Discontinued Coreg, continue Eliquis.   Dyslipidemia - We will continue fenofibrate, Zetia and Lovaza.           Consultants: None Procedures performed: None  Disposition: Hospice care Diet recommendation:  Discharge Diet Orders (From admission, onward)     Start     Ordered   01/15/23 0000  Diet - low sodium heart healthy        01/15/23 1207           Cardiac diet DISCHARGE MEDICATION: Allergies as of 01/15/2023       Reactions   Tylenol [acetaminophen]    rash        Medication List     STOP taking these medications    amoxicillin-clavulanate 875-125 MG tablet Commonly known as: AUGMENTIN   carvedilol 25 MG tablet Commonly known as: COREG   Jublia 10 % Soln Generic drug: Efinaconazole   metFORMIN 500 MG 24 hr tablet Commonly known as: GLUCOPHAGE-XR       TAKE these medications    amLODipine 5 MG tablet Commonly known as: NORVASC Take 5 mg by mouth daily.   Eliquis 5 MG  Tabs tablet Generic drug: apixaban Take 1 tablet (5 mg total) by mouth 2 (two) times daily. Home med. What changed: Another medication with the same name was removed. Continue taking this medication, and follow the directions you see here.   ezetimibe 10 MG tablet Commonly known as: ZETIA Take 10 mg by mouth daily.   fenofibrate 54 MG tablet Take 54 mg by mouth  daily.   glipiZIDE 5 MG 24 hr tablet Commonly known as: GLUCOTROL XL Take 5 mg by mouth daily.   ipratropium-albuterol 0.5-2.5 (3) MG/3ML Soln Commonly known as: DUONEB Take 3 mLs by nebulization every 4 (four) hours as needed.   levothyroxine 25 MCG tablet Commonly known as: SYNTHROID Take 25 mcg by mouth every morning.   morphine CONCENTRATE 10 mg / 0.5 ml concentrated solution Take 50 mg by mouth every 4 (four) hours as needed.   omega-3 acid ethyl esters 1 g capsule Commonly known as: LOVAZA Take 2 capsules by mouth 2 (two) times daily.   PreviDent 5000 Plus 1.1 % Crea dental cream Generic drug: sodium fluoride Place 1 Application onto teeth at bedtime.   QUEtiapine 25 MG tablet Commonly known as: SEROquel Take 1 tablet (25 mg total) by mouth at bedtime. What changed:  when to take this reasons to take this   rosuvastatin 10 MG tablet Commonly known as: CRESTOR Take 1 tablet by mouth daily.               Discharge Care Instructions  (From admission, onward)           Start     Ordered   01/15/23 0000  Discharge wound care:       Comments: Follow with hospice   01/15/23 1207            Follow-up Information     Kandyce Rud, MD Follow up in 1 week(s).   Specialty: Family Medicine Contact information: [address] [facility] [place] [address] [phone]                Discharge Exam: Ceasar Mons Weights   01/12/23 1634  Weight: 128 kg   General exam: Appears calm and comfortable, morbidly obese. Respiratory system: Clear to auscultation. Respiratory effort normal. Cardiovascular system: Irregular. No JVD, murmurs, rubs, gallops or clicks. No pedal edema. Gastrointestinal system: Abdomen is nondistended, soft and nontender. No organomegaly or masses felt. Normal bowel sounds heard. Central nervous system: Alert and oriented. No focal neurological deficits. Extremities: Symmetric 5 x  5 power. Skin: No rashes, lesions or ulcers Psychiatry: Judgement and insight appear normal. Mood & affect appropriate.    Condition at discharge: fair  The results of significant diagnostics from this hospitalization (including imaging, microbiology, ancillary and laboratory) are listed below for reference.   Imaging Studies: DG Chest Port 1 View  Result Date: 01/12/2023 CLINICAL DATA:  Possible sepsis EXAM: PORTABLE CHEST 1 VIEW COMPARISON:  01/01/2023 FINDINGS: Low lung volumes. Mild cardiomegaly with central bronchovascular crowding. Small right-sided pleural effusion. Mild airspace disease at the bilateral lung bases. No pneumothorax IMPRESSION: Low lung volumes with mild cardiomegaly and central bronchovascular crowding. Small right pleural effusion. Mild airspace disease at the bilateral lung bases, atelectasis versus pneumonia. Electronically Signed   By: Jasmine Pang M.D.   On: 01/12/2023 19:11   CT ANGIO HEAD NECK W WO CM  Result Date: 01/02/2023 CLINICAL DATA:  Altered mental status, stroke suspected EXAM: CT ANGIOGRAPHY HEAD AND NECK WITH AND  WITHOUT CONTRAST TECHNIQUE: Multidetector CT imaging of the head and neck was performed using the standard protocol during bolus administration of intravenous contrast. Multiplanar CT image reconstructions and MIPs were obtained to evaluate the vascular anatomy. Carotid stenosis measurements (when applicable) are obtained utilizing NASCET criteria, using the distal internal carotid diameter as the denominator. RADIATION DOSE REDUCTION: This exam was performed according to the departmental dose-optimization program which includes automated exposure control, adjustment of the mA and/or kV according to patient size and/or use of iterative reconstruction technique. CONTRAST:  75mL OMNIPAQUE IOHEXOL 350 MG/ML SOLN COMPARISON:  No prior CTA available, correlation is made with 01/20/2022 CT head FINDINGS: CT HEAD FINDINGS Brain: No evidence of acute  infarct, hemorrhage, mass, mass effect, or midline shift. No hydrocephalus. 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 Vascular: No hyperdense vessel. Skull: Negative for fracture or focal lesion. Sinuses/Orbits: No acute finding. Other: The mastoid air cells are well aerated. CTA NECK FINDINGS Evaluation is limited by bolus timing. Aortic arch: Two-vessel arch with a common origin of the brachiocephalic and left common carotid arteries. Imaged portion shows no evidence of aneurysm or dissection. No significant stenosis of the major arch vessel origins. Aortic atherosclerosis. Right carotid system: No evidence of dissection, occlusion, or hemodynamically significant stenosis (greater than 50%). Left carotid system: No evidence of dissection, occlusion, or hemodynamically significant stenosis (greater than 50%). Atherosclerotic disease at the bifurcation and in the proximal ICA is not hemodynamically significant. Retropharyngeal course of the proximal left ICA. Vertebral arteries: Evaluation of the vertebral arteries is particularly limited by bolus timing. Within this limitation, no evidence of significant stenosis, occlusion, or dissection. Skeleton: No acute osseous abnormality. Degenerative changes in the cervical spine. Other neck: Endotracheal and orogastric tubes noted. Upper chest: For findings in the thorax, please see same day CT chest. Review of the MIP images confirms the above findings CTA HEAD FINDINGS Evaluation is limited by bolus timing. Anterior circulation: Both internal carotid arteries are patent to the termini, with mild stenosis in the right cavernous segment. Aplastic right A1. Patent left A1. Left dominant system. Normal anterior communicating artery. Anterior cerebral arteries are patent to their distal aspects without significant stenosis. No M1 stenosis or occlusion. Calcifications are noted  distal right greater than left M1 segment. MCA branches perfused to their distal aspects without significant stenosis. Posterior circulation: Poor visualization of the left V4, which may be occluded. Mild stenosis in the proximal right V4. Basilar patent to its distal aspect without significant stenosis. Superior cerebellar arteries patent proximally. Patent left P1. Aplastic right P1. Fetal origin the right PCA from the right posterior communicating artery. Proximal PCAs appear well perfused, with visualization of the more distal PCAs limited. Venous sinuses: Not well opacified. Anatomic variants: Fetal origin of the right PCA. No evidence of aneurysm or vascular malformation. Review of the MIP images confirms the above findings IMPRESSION: 1. No acute intracranial process. 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 2. Evaluation is limited by bolus timing. Within this limitation, no hemodynamically significant stenosis in the neck. 3. Evaluation of the intracranial vasculature is limited by bolus timing. Within this limitation, there is poor visualization the left V4, which may be occluded or terminate in PICA. No other intracranial large vessel occlusion or high-grade stenosis. If further evaluation is indicated, consider MRA. 4. Aortic atherosclerosis. Aortic Atherosclerosis (ICD10-I70.0). Electronically Signed   By: Wiliam Ke M.D.   On: 01/02/2023 00:30  CT CHEST W CONTRAST  Result Date: 01/01/2023 CLINICAL DATA:  Difficulty breathing EXAM: CT CHEST WITH CONTRAST TECHNIQUE: Multidetector CT imaging of the chest was performed during intravenous contrast administration. RADIATION DOSE REDUCTION: This exam was performed according to the departmental dose-optimization program which includes automated exposure control, adjustment of the mA and/or kV according to patient size and/or use of iterative reconstruction technique. CONTRAST:  75mL OMNIPAQUE IOHEXOL 350  MG/ML SOLN COMPARISON:  Chest x-ray from earlier in the same day. FINDINGS: Cardiovascular: Thoracic aorta shows atherosclerotic calcifications without aneurysmal dilatation. Heart is mildly enlarged in size. Coronary calcifications are noted. Pulmonary artery as visualized shows no large central pulmonary embolus although not timed for embolus evaluation. Mediastinum/Nodes: Endotracheal tube and gastric catheter are noted in satisfactory position. Esophagus is within normal limits. No hilar or mediastinal adenopathy is noted. Thoracic inlet is unremarkable. Lungs/Pleura: Lungs are well aerated bilaterally. Bilateral lower lobe consolidation is noted similar to that seen on prior plain film examination. Small effusions are noted bilaterally. No parenchymal nodules are seen. Upper Abdomen: Visualized upper abdomen shows evidence of cholelithiasis. Musculoskeletal: Degenerative changes of the thoracic spine are noted. No acute rib abnormality is noted. IMPRESSION: Bilateral lower lobe consolidation with associated small effusions similar to that seen on prior plain film. Cholelithiasis without complicating factors. Electronically Signed   By: Alcide Clever M.D.   On: 01/01/2023 23:37   DG Abd Portable 1V  Result Date: 01/01/2023 CLINICAL DATA:  OG tube placement. EXAM: PORTABLE ABDOMEN - 1 VIEW COMPARISON:  01/01/2023 FINDINGS: Limited field of view for tube placement verification. An enteric tube is present with tip projecting over the left upper quadrant consistent with location in the body of the stomach. IMPRESSION: Enteric tube projects over the left upper quadrant consistent with location in the body of the stomach. Electronically Signed   By: Burman Nieves M.D.   On: 01/01/2023 20:50   DG Chest Port 1 View  Result Date: 01/01/2023 CLINICAL DATA:  Intubation and OG tube placement EXAM: PORTABLE CHEST 1 VIEW COMPARISON:  01/01/2023 FINDINGS: An enteric tube is present with tip measuring 5.3 cm above  the carina. No definite enteric tube is identified. Possible coiled tube in the pharyngeal region. Shallow inspiration with atelectasis in the lung bases. Probable small pleural effusions. Cardiac enlargement. No focal consolidation. No pneumothorax. Calcification of the aorta. IMPRESSION: 1. Endotracheal tube positioned with tip above the carina. 2. No definite enteric tube is identified, possibly coiled in the pharynx. 3. Shallow inspiration with atelectasis in the lung bases and small effusions. Electronically Signed   By: Burman Nieves M.D.   On: 01/01/2023 20:49   DG Abd 1 View  Result Date: 01/01/2023 CLINICAL DATA:  OG tube placement EXAM: ABDOMEN - 1 VIEW COMPARISON:  Chest radiograph 01/01/2023 FINDINGS: Limited field of view for tube placement verification. No enteric tube is demonstrated within the field of view. There is evidence of atelectasis in the lung bases. Visualized bowel gas pattern is normal without small or large bowel distention. An inferior vena caval filter is present. Degenerative changes in the spine. Vascular calcifications. IMPRESSION: No enteric tube is demonstrated within the field of view. Electronically Signed   By: Burman Nieves M.D.   On: 01/01/2023 20:48   DG Chest Port 1 View  Result Date: 01/01/2023 CLINICAL DATA:  Possible sepsis with shortness of breath EXAM: PORTABLE CHEST 1 VIEW COMPARISON:  04/08/2009 FINDINGS: Cardiac shadow is enlarged. Aortic calcifications are again seen. Small bilateral pleural effusions are noted  as well as bibasilar atelectasis. No bony abnormality is noted. IMPRESSION: Bilateral pleural effusions with bibasilar atelectasis. Electronically Signed   By: Alcide Clever M.D.   On: 01/01/2023 19:16    Microbiology: Results for orders placed or performed during the hospital encounter of 01/12/23  Resp panel by RT-PCR (RSV, Flu A&B, Covid) Anterior Nasal Swab     Status: None   Collection Time: 01/12/23  5:11 PM   Specimen: Anterior  Nasal Swab  Result Value Ref Range Status   SARS Coronavirus 2 by RT PCR NEGATIVE NEGATIVE Final    Comment: (NOTE) SARS-CoV-2 target nucleic acids are NOT DETECTED.  The SARS-CoV-2 RNA is generally detectable in upper respiratory specimens during the acute phase of infection. The lowest concentration of SARS-CoV-2 viral copies this assay can detect is 138 copies/mL. A negative result does not preclude SARS-Cov-2 infection and should not be used as the sole basis for treatment or other patient management decisions. A negative result may occur with  improper specimen collection/handling, submission of specimen other than nasopharyngeal swab, presence of viral mutation(s) within the areas targeted by this assay, and inadequate number of viral copies(<138 copies/mL). A negative result must be combined with clinical observations, patient history, and epidemiological information. The expected result is Negative.  Fact Sheet for Patients:  BloggerCourse.com  Fact Sheet for Healthcare Providers:  SeriousBroker.it  This test is no t yet approved or cleared by the [place] FDA and  has been authorized for detection and/or diagnosis of SARS-CoV-2 by FDA under an Emergency Use Authorization (EUA). This EUA will remain  in effect (meaning this test can be used) for the duration of the COVID-19 declaration under Section 564(b)(1) of the Act, 21 U.S.C.section 360bbb-3(b)(1), unless the authorization is terminated  or revoked sooner.       Influenza A by PCR NEGATIVE NEGATIVE Final   Influenza B by PCR NEGATIVE NEGATIVE Final    Comment: (NOTE) The Xpert Xpress SARS-CoV-2/FLU/RSV plus assay is intended as an aid in the diagnosis of influenza from Nasopharyngeal swab specimens and should not be used as a sole basis for treatment. Nasal washings and aspirates are unacceptable for Xpert Xpress SARS-CoV-2/FLU/RSV testing.  Fact Sheet for  Patients: BloggerCourse.com  Fact Sheet for Healthcare Providers: SeriousBroker.it  This test is not yet approved or cleared by the [place] FDA and has been authorized for detection and/or diagnosis of SARS-CoV-2 by FDA under an Emergency Use Authorization (EUA). This EUA will remain in effect (meaning this test can be used) for the duration of the COVID-19 declaration under Section 564(b)(1) of the Act, 21 U.S.C. section 360bbb-3(b)(1), unless the authorization is terminated or revoked.     Resp Syncytial Virus by PCR NEGATIVE NEGATIVE Final    Comment: (NOTE) Fact Sheet for Patients: BloggerCourse.com  Fact Sheet for Healthcare Providers: SeriousBroker.it  This test is not yet approved or cleared by the [place] FDA and has been authorized for detection and/or diagnosis of SARS-CoV-2 by FDA under an Emergency Use Authorization (EUA). This EUA will remain in effect (meaning this test can be used) for the duration of the COVID-19 declaration under Section 564(b)(1) of the Act, 21 U.S.C. section 360bbb-3(b)(1), unless the authorization is terminated or revoked.  Performed at [facility], [address] [place], [place] [address]   Blood Culture (routine x 2)     Status: None (Preliminary result)   Collection Time: 01/12/23  5:11 PM   Specimen: BLOOD  Result Value Ref Range Status  Specimen Description BLOOD BLOOD LEFT FOREARM  Final   Special Requests   Final    BOTTLES DRAWN AEROBIC AND ANAEROBIC Blood Culture adequate volume   Culture   Final    NO GROWTH 3 DAYS Performed at [facility], [address] [place], [place] [address]    Report Status PENDING  Incomplete    Labs: CBC: Recent Labs  Lab 01/12/23 1642 01/13/23 0529  WBC 4.2 3.9*  HGB 11.3* 11.0*  HCT 38.4* 36.3*  MCV 101.9* 99.2  PLT 132* 134*   Basic Metabolic  Panel: Recent Labs  Lab 01/12/23 1642 01/13/23 0529 01/13/23 1405 01/14/23 0435 01/15/23 0612  NA 137 138  --  138 139  K 5.8* 5.2* 5.1 5.2* 4.9  CL 98 101  --  100 102  CO2 35* 32  --  31 31  GLUCOSE 102* 76  --  105* 86  BUN 37* 32*  --  32* 30*  CREATININE 1.25* 1.14  --  1.30* 1.37*  CALCIUM 8.8* 8.6*  --  8.6* 8.7*   Liver Function Tests: Recent Labs  Lab 01/12/23 1642  AST 19  ALT 20  ALKPHOS 35*  BILITOT 0.7  PROT 6.8  ALBUMIN 3.4*   CBG: Recent Labs  Lab 01/12/23 1643  GLUCAP 97    Discharge time spent: greater than 30 minutes.  Signed: Marrion Coy, MD [facility] 01/15/2023

## 2023-01-15 NOTE — Progress Notes (Addendum)
Patient is ready for discharge. I have reviewed discharge instructions with patient and  patient's caretaker. I have removed patient's IV without complication. Patient has been taken off telemetry. He will be transported home by his team of caretakers. He is ready for discharge and transport back to his home.

## 2023-01-17 LAB — CULTURE, BLOOD (ROUTINE X 2)
Culture: NO GROWTH
Special Requests: ADEQUATE

## 2023-03-26 ENCOUNTER — Emergency Department (HOSPITAL_COMMUNITY)

## 2023-03-26 ENCOUNTER — Inpatient Hospital Stay (HOSPITAL_COMMUNITY)

## 2023-03-26 ENCOUNTER — Inpatient Hospital Stay (HOSPITAL_COMMUNITY)
Admission: EM | Admit: 2023-03-26 | Discharge: 2023-03-31 | DRG: 065 | Disposition: A | Discharge status: Hospice | Attending: Infectious Diseases | Admitting: Infectious Diseases

## 2023-03-26 ENCOUNTER — Other Ambulatory Visit: Payer: Self-pay

## 2023-03-26 ENCOUNTER — Encounter (HOSPITAL_COMMUNITY): Payer: Self-pay

## 2023-03-26 DIAGNOSIS — E860 Dehydration: Secondary | ICD-10-CM | POA: Diagnosis present

## 2023-03-26 DIAGNOSIS — Z515 Encounter for palliative care: Secondary | ICD-10-CM

## 2023-03-26 DIAGNOSIS — R1310 Dysphagia, unspecified: Secondary | ICD-10-CM | POA: Diagnosis present

## 2023-03-26 DIAGNOSIS — Z8052 Family history of malignant neoplasm of bladder: Secondary | ICD-10-CM

## 2023-03-26 DIAGNOSIS — Z7982 Long term (current) use of aspirin: Secondary | ICD-10-CM

## 2023-03-26 DIAGNOSIS — I342 Nonrheumatic mitral (valve) stenosis: Secondary | ICD-10-CM | POA: Diagnosis present

## 2023-03-26 DIAGNOSIS — E1165 Type 2 diabetes mellitus with hyperglycemia: Secondary | ICD-10-CM | POA: Diagnosis present

## 2023-03-26 DIAGNOSIS — I639 Cerebral infarction, unspecified: Principal | ICD-10-CM

## 2023-03-26 DIAGNOSIS — Z7984 Long term (current) use of oral hypoglycemic drugs: Secondary | ICD-10-CM | POA: Diagnosis not present

## 2023-03-26 DIAGNOSIS — Z1152 Encounter for screening for COVID-19: Secondary | ICD-10-CM | POA: Diagnosis not present

## 2023-03-26 DIAGNOSIS — E1122 Type 2 diabetes mellitus with diabetic chronic kidney disease: Secondary | ICD-10-CM | POA: Diagnosis present

## 2023-03-26 DIAGNOSIS — G9340 Encephalopathy, unspecified: Secondary | ICD-10-CM | POA: Diagnosis present

## 2023-03-26 DIAGNOSIS — N1832 Chronic kidney disease, stage 3b: Secondary | ICD-10-CM | POA: Diagnosis present

## 2023-03-26 DIAGNOSIS — Z95828 Presence of other vascular implants and grafts: Secondary | ICD-10-CM

## 2023-03-26 DIAGNOSIS — E669 Obesity, unspecified: Secondary | ICD-10-CM | POA: Diagnosis present

## 2023-03-26 DIAGNOSIS — R29810 Facial weakness: Secondary | ICD-10-CM | POA: Diagnosis present

## 2023-03-26 DIAGNOSIS — G8191 Hemiplegia, unspecified affecting right dominant side: Secondary | ICD-10-CM | POA: Diagnosis present

## 2023-03-26 DIAGNOSIS — R29713 NIHSS score 13: Secondary | ICD-10-CM | POA: Diagnosis present

## 2023-03-26 DIAGNOSIS — I1310 Hypertensive heart and chronic kidney disease without heart failure, with stage 1 through stage 4 chronic kidney disease, or unspecified chronic kidney disease: Secondary | ICD-10-CM | POA: Diagnosis present

## 2023-03-26 DIAGNOSIS — G311 Senile degeneration of brain, not elsewhere classified: Secondary | ICD-10-CM | POA: Diagnosis not present

## 2023-03-26 DIAGNOSIS — E11610 Type 2 diabetes mellitus with diabetic neuropathic arthropathy: Secondary | ICD-10-CM | POA: Diagnosis present

## 2023-03-26 DIAGNOSIS — R4182 Altered mental status, unspecified: Secondary | ICD-10-CM | POA: Diagnosis present

## 2023-03-26 DIAGNOSIS — G9349 Other encephalopathy: Secondary | ICD-10-CM | POA: Diagnosis present

## 2023-03-26 DIAGNOSIS — R29818 Other symptoms and signs involving the nervous system: Secondary | ICD-10-CM

## 2023-03-26 DIAGNOSIS — R471 Dysarthria and anarthria: Secondary | ICD-10-CM | POA: Diagnosis present

## 2023-03-26 DIAGNOSIS — I6389 Other cerebral infarction: Secondary | ICD-10-CM | POA: Diagnosis not present

## 2023-03-26 DIAGNOSIS — Z993 Dependence on wheelchair: Secondary | ICD-10-CM

## 2023-03-26 DIAGNOSIS — I4821 Permanent atrial fibrillation: Secondary | ICD-10-CM | POA: Diagnosis present

## 2023-03-26 DIAGNOSIS — E119 Type 2 diabetes mellitus without complications: Secondary | ICD-10-CM

## 2023-03-26 DIAGNOSIS — I6329 Cerebral infarction due to unspecified occlusion or stenosis of other precerebral arteries: Principal | ICD-10-CM | POA: Diagnosis present

## 2023-03-26 DIAGNOSIS — Z86718 Personal history of other venous thrombosis and embolism: Secondary | ICD-10-CM

## 2023-03-26 DIAGNOSIS — Z86711 Personal history of pulmonary embolism: Secondary | ICD-10-CM

## 2023-03-26 DIAGNOSIS — Z7901 Long term (current) use of anticoagulants: Secondary | ICD-10-CM

## 2023-03-26 DIAGNOSIS — I4891 Unspecified atrial fibrillation: Secondary | ICD-10-CM | POA: Diagnosis present

## 2023-03-26 DIAGNOSIS — Z7401 Bed confinement status: Secondary | ICD-10-CM

## 2023-03-26 DIAGNOSIS — E785 Hyperlipidemia, unspecified: Secondary | ICD-10-CM | POA: Diagnosis present

## 2023-03-26 DIAGNOSIS — Z886 Allergy status to analgesic agent status: Secondary | ICD-10-CM

## 2023-03-26 DIAGNOSIS — G4733 Obstructive sleep apnea (adult) (pediatric): Secondary | ICD-10-CM | POA: Diagnosis present

## 2023-03-26 DIAGNOSIS — N179 Acute kidney failure, unspecified: Secondary | ICD-10-CM | POA: Diagnosis not present

## 2023-03-26 DIAGNOSIS — Z79899 Other long term (current) drug therapy: Secondary | ICD-10-CM

## 2023-03-26 DIAGNOSIS — G3189 Other specified degenerative diseases of nervous system: Secondary | ICD-10-CM | POA: Diagnosis present

## 2023-03-26 DIAGNOSIS — G928 Other toxic encephalopathy: Secondary | ICD-10-CM

## 2023-03-26 DIAGNOSIS — Z96649 Presence of unspecified artificial hip joint: Secondary | ICD-10-CM | POA: Diagnosis present

## 2023-03-26 DIAGNOSIS — E662 Morbid (severe) obesity with alveolar hypoventilation: Secondary | ICD-10-CM | POA: Diagnosis present

## 2023-03-26 DIAGNOSIS — J9811 Atelectasis: Secondary | ICD-10-CM | POA: Diagnosis not present

## 2023-03-26 DIAGNOSIS — H4922 Sixth [abducent] nerve palsy, left eye: Secondary | ICD-10-CM | POA: Diagnosis present

## 2023-03-26 DIAGNOSIS — E1149 Type 2 diabetes mellitus with other diabetic neurological complication: Secondary | ICD-10-CM | POA: Diagnosis not present

## 2023-03-26 DIAGNOSIS — E039 Hypothyroidism, unspecified: Secondary | ICD-10-CM | POA: Diagnosis present

## 2023-03-26 DIAGNOSIS — Z79890 Hormone replacement therapy (postmenopausal): Secondary | ICD-10-CM

## 2023-03-26 DIAGNOSIS — Z8701 Personal history of pneumonia (recurrent): Secondary | ICD-10-CM

## 2023-03-26 DIAGNOSIS — R4701 Aphasia: Secondary | ICD-10-CM | POA: Diagnosis present

## 2023-03-26 LAB — I-STAT CHEM 8, ED
BUN: 39 mg/dL — ABNORMAL HIGH (ref 8–23)
Calcium, Ion: 1.13 mmol/L — ABNORMAL LOW (ref 1.15–1.40)
Chloride: 105 mmol/L (ref 98–111)
Creatinine, Ser: 2.1 mg/dL — ABNORMAL HIGH (ref 0.61–1.24)
Glucose, Bld: 235 mg/dL — ABNORMAL HIGH (ref 70–99)
HCT: 34 % — ABNORMAL LOW (ref 39.0–52.0)
Hemoglobin: 11.6 g/dL — ABNORMAL LOW (ref 13.0–17.0)
Potassium: 5.1 mmol/L (ref 3.5–5.1)
Sodium: 136 mmol/L (ref 135–145)
TCO2: 25 mmol/L (ref 22–32)

## 2023-03-26 LAB — ECHOCARDIOGRAM COMPLETE BUBBLE STUDY
AR max vel: 3.83 cm2
AV Area VTI: 3.94 cm2
AV Area mean vel: 3.76 cm2
AV Mean grad: 5 mm[Hg]
AV Peak grad: 8.7 mm[Hg]
Ao pk vel: 1.48 m/s
Area-P 1/2: 3.85 cm2
Calc EF: 57.4 %
MV VTI: 2.76 cm2
S' Lateral: 3.5 cm
Single Plane A2C EF: 63.9 %
Single Plane A4C EF: 55.1 %

## 2023-03-26 LAB — CBC
HCT: 36.5 % — ABNORMAL LOW (ref 39.0–52.0)
Hemoglobin: 11.6 g/dL — ABNORMAL LOW (ref 13.0–17.0)
MCH: 31.1 pg (ref 26.0–34.0)
MCHC: 31.8 g/dL (ref 30.0–36.0)
MCV: 97.9 fL (ref 80.0–100.0)
Platelets: 167 10*3/uL (ref 150–400)
RBC: 3.73 MIL/uL — ABNORMAL LOW (ref 4.22–5.81)
RDW: 13.9 % (ref 11.5–15.5)
WBC: 4.4 10*3/uL (ref 4.0–10.5)
nRBC: 0 % (ref 0.0–0.2)

## 2023-03-26 LAB — I-STAT VENOUS BLOOD GAS, ED
Acid-Base Excess: 1 mmol/L (ref 0.0–2.0)
Bicarbonate: 26.3 mmol/L (ref 20.0–28.0)
Calcium, Ion: 1.31 mmol/L (ref 1.15–1.40)
HCT: 34 % — ABNORMAL LOW (ref 39.0–52.0)
Hemoglobin: 11.6 g/dL — ABNORMAL LOW (ref 13.0–17.0)
O2 Saturation: 90 %
Potassium: 5 mmol/L (ref 3.5–5.1)
Sodium: 135 mmol/L (ref 135–145)
TCO2: 28 mmol/L (ref 22–32)
pCO2, Ven: 43.9 mm[Hg] — ABNORMAL LOW (ref 44–60)
pH, Ven: 7.386 (ref 7.25–7.43)
pO2, Ven: 59 mm[Hg] — ABNORMAL HIGH (ref 32–45)

## 2023-03-26 LAB — COMPREHENSIVE METABOLIC PANEL
ALT: 12 U/L (ref 0–44)
AST: 22 U/L (ref 15–41)
Albumin: 3.3 g/dL — ABNORMAL LOW (ref 3.5–5.0)
Alkaline Phosphatase: 37 U/L — ABNORMAL LOW (ref 38–126)
Anion gap: 11 (ref 5–15)
BUN: 35 mg/dL — ABNORMAL HIGH (ref 8–23)
CO2: 23 mmol/L (ref 22–32)
Calcium: 9.5 mg/dL (ref 8.9–10.3)
Chloride: 103 mmol/L (ref 98–111)
Creatinine, Ser: 2.02 mg/dL — ABNORMAL HIGH (ref 0.61–1.24)
GFR, Estimated: 31 mL/min — ABNORMAL LOW (ref >60)
Glucose, Bld: 238 mg/dL — ABNORMAL HIGH (ref 70–99)
Potassium: 5.1 mmol/L (ref 3.5–5.1)
Sodium: 137 mmol/L (ref 135–145)
Total Bilirubin: 0.6 mg/dL (ref 0.0–1.2)
Total Protein: 6.3 g/dL — ABNORMAL LOW (ref 6.5–8.1)

## 2023-03-26 LAB — RESP PANEL BY RT-PCR (RSV, FLU A&B, COVID)  RVPGX2
Influenza A by PCR: NEGATIVE
Influenza B by PCR: NEGATIVE
Resp Syncytial Virus by PCR: NEGATIVE
SARS Coronavirus 2 by RT PCR: NEGATIVE

## 2023-03-26 LAB — CBG MONITORING, ED
Glucose-Capillary: 206 mg/dL — ABNORMAL HIGH (ref 70–99)
Glucose-Capillary: 229 mg/dL — ABNORMAL HIGH (ref 70–99)

## 2023-03-26 LAB — DIFFERENTIAL
Abs Immature Granulocytes: 0.02 10*3/uL (ref 0.00–0.07)
Basophils Absolute: 0 10*3/uL (ref 0.0–0.1)
Basophils Relative: 1 %
Eosinophils Absolute: 0.2 10*3/uL (ref 0.0–0.5)
Eosinophils Relative: 4 %
Immature Granulocytes: 1 %
Lymphocytes Relative: 26 %
Lymphs Abs: 1.2 10*3/uL (ref 0.7–4.0)
Monocytes Absolute: 0.5 10*3/uL (ref 0.1–1.0)
Monocytes Relative: 11 %
Neutro Abs: 2.6 10*3/uL (ref 1.7–7.7)
Neutrophils Relative %: 57 %

## 2023-03-26 LAB — ETHANOL: Alcohol, Ethyl (B): <10 mg/dL (ref <10)

## 2023-03-26 LAB — LIPID PANEL
Cholesterol: 155 mg/dL (ref 0–200)
HDL: 37 mg/dL — ABNORMAL LOW (ref >40)
LDL Cholesterol: 59 mg/dL (ref 0–99)
Total CHOL/HDL Ratio: 4.2 {ratio}
Triglycerides: 296 mg/dL — ABNORMAL HIGH (ref <150)
VLDL: 59 mg/dL — ABNORMAL HIGH (ref 0–40)

## 2023-03-26 LAB — PROTIME-INR
INR: 1 (ref 0.8–1.2)
Prothrombin Time: 13.8 s (ref 11.4–15.2)

## 2023-03-26 LAB — HEMOGLOBIN A1C
Hgb A1c MFr Bld: 8.1 % — ABNORMAL HIGH (ref 4.8–5.6)
Mean Plasma Glucose: 185.77 mg/dL

## 2023-03-26 LAB — APTT: aPTT: 29 s (ref 24–36)

## 2023-03-26 MED ORDER — EZETIMIBE 10 MG PO TABS
10.0000 mg | ORAL_TABLET | Freq: Every day | ORAL | Status: DC
  Administered 2023-03-27 – 2023-03-31 (×5): 10 mg via ORAL
  Filled 2023-03-26 (×6): qty 1

## 2023-03-26 MED ORDER — FOOD THICKENER (SIMPLYTHICK HONEY): 10.0000 | ORAL | Status: DC | PRN

## 2023-03-26 MED ORDER — LACTATED RINGERS IV SOLN: INTRAVENOUS | Status: AC

## 2023-03-26 MED ORDER — ASPIRIN 300 MG RE SUPP
300.0000 mg | Freq: Once | RECTAL | Status: AC
  Administered 2023-03-26: 300 mg via RECTAL
  Filled 2023-03-26: qty 1

## 2023-03-26 MED ORDER — ASPIRIN 81 MG PO CHEW
81.0000 mg | CHEWABLE_TABLET | Freq: Every day | ORAL | Status: DC
  Administered 2023-03-27 – 2023-03-31 (×5): 81 mg via ORAL
  Filled 2023-03-26 (×6): qty 1

## 2023-03-26 MED ORDER — SODIUM CHLORIDE 0.9% FLUSH
3.0000 mL | Freq: Once | INTRAVENOUS | Status: AC
  Administered 2023-03-26: 3 mL via INTRAVENOUS

## 2023-03-26 MED ORDER — FENOFIBRATE 54 MG PO TABS
54.0000 mg | ORAL_TABLET | Freq: Every day | ORAL | Status: DC
  Administered 2023-03-27 – 2023-03-31 (×5): 54 mg via ORAL
  Filled 2023-03-26 (×6): qty 1

## 2023-03-26 MED ORDER — LEVOTHYROXINE SODIUM 25 MCG PO TABS
25.0000 ug | ORAL_TABLET | Freq: Every morning | ORAL | Status: DC
Start: 2023-03-27 — End: 2023-03-31
  Administered 2023-03-28 – 2023-03-31 (×4): 25 ug via ORAL
  Filled 2023-03-26 (×4): qty 1

## 2023-03-26 MED ORDER — IOHEXOL 350 MG/ML SOLN
75.0000 mL | Freq: Once | INTRAVENOUS | Status: AC | PRN
  Administered 2023-03-26: 75 mL via INTRAVENOUS

## 2023-03-26 NOTE — ED Notes (Signed)
 Pt brought from MRI to Room 39. Home caregivers are at bedside.

## 2023-03-26 NOTE — Progress Notes (Signed)
  Echocardiogram 2D Echocardiogram has been performed.  Troy Mueller 03/26/2023, 3:57 PM

## 2023-03-26 NOTE — ED Notes (Signed)
 SLP at bedside.

## 2023-03-26 NOTE — ED Triage Notes (Signed)
 Patient came home. Last known well was 0300, caretaker started noticing ride sided deficits at 0400. EMS noted right facial droop, right sided weakness, and right fixed gaze. Hx of A-fib, takes eliquis.

## 2023-03-26 NOTE — Progress Notes (Addendum)
 [facility] Hospitalized Hospice Patient  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  Patient was admitted to [facility] on 1.11.25 with diagnosis of Encepholophy.  Per Dr. Odella Pepper, with [facility].  This is a related hospital admission.  Patient currently in the ED.  Multiple consults & evaluations pending.  Patient is being treated for code stroke.  Patient does currently follow simple commands, but is unable to speak or articulate.  Patient is appropriate for GIP to monitor new side effect of stroke requiring close monitoring by skilled personnel with continued work up.  Vital Signs:  T 97.1 oral, BP 144/72, P 61, R19, SpO2 98%  Abnormal Labs:  HGB 11.6, HCT 34.0,  Glucose 238, BUN 39, Creat 2.10, Total Protein 6.3, Alkaline Phos 37.  IV Medications: None  Diagnostics: CT ANGIO HEAD NECK W WO CM W PERF (CODE STROKE) Result Date: 03/26/2023 CLINICAL DATA:  89 year old male code stroke. Slurred speech and right facial droop. EXAM: CT ANGIOGRAPHY HEAD AND NECK CT PERFUSION BRAIN TECHNIQUE: Multidetector CT imaging of the head and neck was performed using the standard protocol during bolus administration of intravenous contrast. Multiplanar CT image reconstructions and MIPs were obtained to evaluate the vascular anatomy. Carotid stenosis measurements (when applicable) are obtained utilizing NASCET criteria, using the distal internal carotid diameter as the denominator. Multiphase CT imaging of the brain was performed following IV bolus contrast injection. Subsequent parametric perfusion maps were calculated using RAPID software. RADIATION DOSE REDUCTION: This exam was performed according to the departmental dose-optimization program which includes automated exposure control, adjustment of the mA and/or kV according to patient size and/or use of iterative reconstruction technique. CONTRAST:   75mL OMNIPAQUE  IOHEXOL  350 MG/ML SOLN COMPARISON:  Head CT 0655 hours today. FINDINGS: CT Brain Perfusion Findings: ASPECTS: 10 CBF (<30%) Volume: 0mL.  No abnormal CBF parameter. Perfusion (Tmax>6.0s) volume: 20mL, but appears peripheral and artifactual probably associated with the chronic extra-axial fluid. Mismatch Volume: None when allowing for artifact. Infarction Location:No infarct core detected. CTA NECK Skeleton: Cervical spine and right TMJ degeneration. Levels of degenerative cervical ankylosis. No acute osseous abnormality identified. Upper chest: Atelectatic changes to the trachea but upper lung ventilation is relatively normal. No superior mediastinal lymphadenopathy. Other neck: Retropharyngeal left carotid artery.  No acute finding. Aortic arch: Calcified aortic atherosclerosis.  Bovine arch. Right carotid system: Mild brachiocephalic artery plaque without stenosis. Calcified plaque at the right CCA origin and tortuosity without stenosis. Mild calcified plaque at the right carotid bifurcation. Mild tortuosity and no stenosis. Left carotid system: Bovine left CCA origin without plaque or stenosis. Calcified plaque along the anterior left CCA without stenosis. Bulky calcified plaque at the left ICA origin with numerically estimated 60% stenosis with respect to the distal vessel. Retropharyngeal course of the left ICA with tortuosity, no additional stenosis to the skull base. Vertebral arteries: Calcified right subclavian artery origin and tortuosity without significant stenosis. Normal right vertebral artery origin. Right vertebral artery appears dominant and patent to the skull base with no significant plaque or stenosis. Proximal left subclavian artery calcified plaque with less than 50% stenosis. Mild calcified plaque at the left vertebral artery origin without stenosis. Non dominant left vertebral artery is patent with no significant plaque or stenosis to the skull base. CTA HEAD Posterior  circulation: Dominant right vertebral artery supplies the basilar, left vertebral terminates in PICA. Bulky right V4 circumferential calcified plaque and Severe right  V4 stenosis on series 5, image 162. Distal vertebral and basilar artery origin are patent. Right AICA origin is patent. Patent basilar artery with mild plaque and no stenosis. Fetal right PCA origin. Tortuous left P1 segment. SCA and PCA origins are patent. Bilateral PCA branches are patent and within normal limits. Anterior circulation: Both ICA siphons are patent. Moderate left siphon calcified plaque but only mild left siphon stenosis. Right siphon widespread and moderate calcified plaque, circumferential at the distal cavernous and supraclinoid segments with mild to moderate subsequent stenosis. Right posterior communicating artery origin remains normal. Patent carotid termini. Patent MCA origins. Dominant left and diminutive or absent right ACA A1 segments. No left A1 stenosis. Calcified atherosclerosis at the A2 origin. Azygous type ACA anatomy. No convincing ACA branch occlusion or significant stenosis. Left MCA M1 segment is patent with ectatic and calcified left MCA trifurcation (series 10, image 141) which remains patent without high-grade stenosis. Right MCA origin is calcified with mild stenosis. Right MCA M1 segment is patent without stenosis. Tortuous right MCA bifurcation with bulky calcification (series 10, image 68) but only mild associated stenosis. No definite MCA branch occlusion. There is moderate bilateral MCA branch irregularity and stenoses including M2 segments on the left and M3 segments on the right (series 13, image 15. No discrete intracranial aneurysm. Venous sinuses: Early contrast timing, grossly patent. Anatomic variants: Dominant right vertebral artery supplies the basilar, left terminates in PICA. Dominant left and diminutive or absent right ACA A1 segments. Review of the MIP images confirms the above findings  IMPRESSION: 1. CTA is negative for large vessel occlusion. Negative CT Perfusion; Tmax artifact suspected secondary to chronic subdural fluid collections. 2. Widespread intracranial and extracranial atherosclerosis, including areas of hemodynamically significant stenosis (perhaps most notably Severe Stenosis of the dominant Right Vertebral V4 segment which is the sole supply to the Basilar). But no circle-of-Willis branch occlusion is identified. 3.  Aortic Atherosclerosis (ICD10-I70.0). Study discussed by telephone with Dr. Arora on 03/26/2023 at 07:19 . Electronically Signed   By: VEAR Hurst M.D.   On: 03/26/2023 07:23    CT HEAD CODE STROKE WO CONTRAST Result Date: 03/26/2023 CLINICAL DATA:  Code stroke. 89 year old male with slurred speech and right facial droop. EXAM: CT HEAD WITHOUT CONTRAST TECHNIQUE: Contiguous axial images were obtained from the base of the skull through the vertex without intravenous contrast. RADIATION DOSE REDUCTION: This exam was performed according to the departmental dose-optimization program which includes automated exposure control, adjustment of the mA and/or kV according to patient size and/or use of iterative reconstruction technique. COMPARISON:  Head CT 01/01/2023. FINDINGS: Brain: Chronic bilateral low-density subdural fluid collections superimposed on brain volume loss (coronal image 50), stable since October. No midline shift or acute intracranial mass effect. No ventriculomegaly. Basilar cisterns remain patent. Bulky dural calcifications incidentally noted. No acute intracranial hemorrhage identified. Stable and largely normal for age gray-white differentiation. No cortically based acute infarct identified. Vascular: Chronic severe skull base and bilateral MCA calcified atherosclerosis. Chronic MCA tortuosity. No suspicious intracranial vascular hyperdensity. Skull: Mild skull base motion artifact. No acute osseous abnormality identified. Sinuses/Orbits: Visualized paranasal  sinuses and mastoids are stable and well aerated. Other: No gaze deviation.  No acute scalp soft tissue finding. ASPECTS [facility] Stroke Program Early CT Score) Total score (0-10 with 10 being normal): 10 IMPRESSION: 1. No acute cortically based infarct or acute intracranial hemorrhage identified. ASPECTS 10. 2. 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 No midline shift or acute intracranial mass  effect. 3. Chronic severe intracranial calcified atherosclerosis. No suspicious vessel hyperdensity. These results were communicated to Dr. Arora at 7:03 am on 03/26/2023 by text page via the [facility] messaging system. Electronically Signed   By: VEAR Hurst M.D.   On: 03/26/2023 07:03   Hospital Problems: per H&P Dr. Damien Lease 1.11.25   Troy Mueller is a 89 y.o. 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    Neurological Exam Neurological:  -Pupils are pinpoint, sluggishly reactive to light -Extraocular eye movements are not intact: It appears that patient's gaze is restricted to the left, patient is able to look right without difficulty -Patient was unable to smile on command to appreciate a facial droop, patient is dysarthric with aphasia present -Patient is able to close eyes, appears that peripheral cranial nerve VII is intact -Uvula is not deviated -Right cranial nerve XI weakness on shoulder shrug and noted -Patient has cerebellar function intact (finger-nose to finger testing) with left upper extremity, unable to test with right upper extremity due to overt weakness -Right upper extremity motor strength 0/5, left upper extremity strength appears intact at 5 out of 5 -Left lower  extremity motor strength appears intact, right lower extremity motor strength is 2 out of 5 -Bilateral Babinski present -Patient is dysarthric with expressive aphasia present, able to follow commands MSK: Bilateral Charcot deformity of feet present. No pitting edema Skin: warm and dry Psych: Unable to assess  # Acute encephalopathy Patient presents emergency department with a last known well time of 12 AM.  I suspect that patient's acute encephalopathy is most likely due to CVA at this time.  He has focal neurological deficits including cranial nerve deficits.  CT noncontrast and CTA did not reveal any acute infarct: Will need an MRI to further evaluate presence of an acute infarct.  Less likely to be infectious causes, patient is afebrile and does not have a leukocytosis.  Less likely to be due to hypercapnia, CO2 on a 43.9.  Less likely to be due to liver impairment, or uremia; AST and ALT are within normal limits and BUN is 35.  Ethanol level this time is also within normal limits. Plan: -Obtain MRI -Echocardiogram -Lipid Panel  -A1c  -Continue telemetry  -Consulted, appreciate their recommendations -PT/OT -SLP -NPO -Aspiration precautions    # Permanent atrial fibrillation Patient is not on rate control home, he does take eliquis  for AC.  -Will hold eliquis  until after we are able to obtain MRI   # Severe obstructive sleep apnea Patient has severe struct of sleep apnea and uses home CPAP at night. -Will order BiPAP nightly here   # Type 2 diabetes mellitus Appears that patient is taking glipizide  5 mg daily.  Will hold off on restarting home regimen.  Will monitor with morning CBGs and begin SSI's as necessary.   # Hospice Patient is on hospice for senile degeneration of the brain, he is full code, he is on a home regimen of seroquel  25 mg nightly.  -Will hold off on oral medications    # History of DVT and PE Has an IVC filter, on eliquis  5mg  BID.    #Hypothyroidism  On  Synthroid  25 mcg, will hold on oral medications for  now    Diet: NPO VTE: Will hold until MRI is obtained, possibly restart eliquis  afterwards; SCDs Code: Full, daughter is POA   Discharge Plan:  back home with hospice.  Patient was just admitted this morning 11.11.25. IDT:  updated Family Contact:  with patient at bedside. Goals of Care: clear- patient wants to be Full Code, family in agreement  If patient requires EMS transport, please use [facility] whom [facility] is contracted with.  Saddie HILARIO Na, RN Nurse Liaison [phone]

## 2023-03-26 NOTE — Hospital Course (Addendum)
 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 He was encephalopathic. A code stroke was called, CT noncontrast and CTA did not reveal any acute infarct. MRI showed a left paramedian pontine stroke. He does have significant stenosis of the Right vertebral V4 segment which is the sole supply to the Basilar. LDL 59 Hgb A1c 8.1 Echo EF 55-60% with severely dilated LA Less likely to be infectious causes, patient was afebrile and does not have a leukocytosis.  Less likely to be due to hypercapnia, CO2 on a 43.9.  Less likely to be due to liver impairment, or uremia; AST and ALT are within normal limits and BUN is 35.  Ethanol level WNL.   Neurology was consulted who recommended to start Aspirin  81mg  and Eliquis  daily for at three months and then Eliquis  alone. Speech and PT/OT also evaluated him. Due to risk of aspiration he can have honey thick liquids. With regards to speech, it is recommended that patient would continue with speech language and pathology services with [facility] speech rehab. PT/OT recommended [facility] PT/OT which he will get at home. Currently he has no motor strength in the left upper and limited mobility but not strength in the lower right extremity, has moderate dysarthria and a left facial droop that has been improving. Weakness on the right lower extremity has been worsening. A repeat CT and MRI did not show signs of a new stroke. This is most likely deconditioning from immobility.   -Cw PT/OT  -Cw speech therapy -Aspiration precautions  -Continued evaluation is needed should diet need to be modified. Pt should follow with PCP OP.  -Aspirin  81mg  daily for three months -Eliquis  2.5mg  BID  -Rosuvastatin  10mg  daily  -fenofibrate  54mg  daily  -ezetimibe   10mg  daily   #Atelectasis #At risk for aspiration Had crackles on exam, afebrile, WBC WNL. Spirometry was started and then developed increased sputum. Increased sputum could be related to increased spirometry. Less likely PNA given that he is afebrile and has no leukocytosis. Improvement in sputum production after a couple days of spirometry also go against PNA. He is at risk for aspiration and aspiration precautions should be followed. SLP recommendations as follow:    Diet recommendations: Dysphagia 1 (puree);Honey-thick liquid Liquids provided via: Teaspoon Medication Administration: Crushed with puree Supervision: Staff to assist with self feeding;Full supervision/cueing for compensatory strategies Compensations: Slow rate;Small sips/bites Postural Changes and/or Swallow Maneuvers: Seated upright 90 degrees -cw spirometry  -ctm fever -scopolamine  patch for secretions  #AKI- resolved #CKD Stage 3b Patient presented with a serum creatinine of 2.02.  After IV fluids, his creatinine did improve to 1.7.  I suspect that his AKI was due to dehydration, he was administered more IV maintenance fluids and is now at his baseline. Pt has CKD Stage 3b. Please continue to monitor renal function with BMP.    # Permanent atrial fibrillation Patient is not on rate control home, he does take eliquis  for AC. Patient was restarted on Eliquis  2.5 mg twice daily.   # Severe obstructive sleep apnea Patient has severe OSA and uses home BiPAP at night. He is to use his BiPAP nightly.   # Type 2 diabetes mellitus Patient is taking glipizide  5 mg daily at home.  Can continue with this medication with CBGc checks two to  three times daily as glipizide  can induce hypoglycemia.    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 Palliative was consulted for GOC but pt gets care through [facility]. Ongoing GOC discussions  should be ongoing for this patient, who is on hospice but remains full code and would like TPN should he need it. Further education is needed to assess understanding of family and TPN as TPN may cause more harm in hospice situations ([employer] recommendations). Family also asked about cortrak. Pt is able to tolerate a PO diet. Would recommend him eat naturally vs artificially while he can. He is on a home regimen of seroquel  25 mg nightly.    -cw Seroquel  25mg  daily -Delirium precautions -continue care through [facility] OP -recommendation to have further discussions about continuation of preventative treatments vs discontinuing them given dysphagia. Pt to follow with PCP OP.  # History of DVT and PE Has an IVC filter can continue with eliquis  2.5mg  BID.    #Hypothyroidism  TSH and free T4 were WNL. On Synthroid  25 mcg, pt is to continue on this.

## 2023-03-26 NOTE — ED Notes (Signed)
 Pt transferred to air mattress. New linin provided. Pt is alert, right sided gaze remains. Pt has very minimal, to no movement to right upper and lower extremities, minimal use of left side. Severe dysphasia,dysarthria present, but pt is able to communicate some needs and does appear to understand questions and follow directions.

## 2023-03-26 NOTE — Progress Notes (Signed)
 2D echo attempted, therapy in room. Will try echo later

## 2023-03-26 NOTE — Consult Note (Addendum)
 NEUROLOGY CONSULT NOTE   Date of service: March 26, 2023 Patient Name: Troy Mueller MRN:  [id number] DOB:  1932-12-10 Chief Complaint: Code stroke-right gaze, right-sided weakness Requesting Provider: Charlyn Sora, MD History was obtained from Troy Mueller over the phone  History of Present Illness  Troy Mueller is a 89 y.o. male  has a past medical history of Diabetes mellitus without complication (HCC), DVT (deep venous thrombosis) (HCC), Embolus (HCC), and HTN (hypertension).   Obesity hypoventilation syndrome, bedbound at baseline requiring 24/7 care modified Rankin of 5, atrial fibrillation on Eliquis , presented to the emergency department for evaluation of strokelike symptoms via [facility]. EMS was called to his home and he was noted to have right-sided weakness at around 3:45 AM.  Last known well was somewhere around midnight when he used the bathroom.  He is able to use his upper extremities well but his lower extremities are extremely weak at baseline.  He has had multiple admissions over the course of last 6 months for respiratory failure-hypercarbic and hypoxic. He is currently on BiPAP. He has 2 nurses in the daytime and 1 nurse at night taking care of him. He is not able to provide much history    LKW: 12:01 AM on 03/26/2023 although initial page said 3 AM last known well Modified rankin score: 5-Severe disability-bedridden, incontinent, needs constant attention IV Thrombolysis: Outside the window, on Eliquis  EVT: No LVO, modified Rankin 5  NIHSS components Score: Comment  1a Level of Conscious 0[x]  1[]  2[]  3[]      1b LOC Questions 0[]  1[]  2[x]       1c LOC Commands 0[x]  1[]  2[]       2 Best Gaze 0[]  1[x]  2[]       3 Visual 0[x]  1[]  2[]  3[]      4 Facial Palsy 0[]  1[]  2[x]  3[]      5a Motor Arm - left 0[x]  1[]  2[]  3[]  4[]  UN[]    5b Motor Arm - Right 0[]  1[]  2[]  3[x]  4[]  UN[]    6a Motor Leg - Left 0[]  1[]  2[x]  3[]  4[]  UN[]    6b Motor  Leg - Right 0[]  1[]  2[x]  3[]  4[]  UN[]    7 Limb Ataxia 0[x]  1[]  2[]  3[]  UN[]     8 Sensory 0[x]  1[]  2[]  UN[]      9 Best Language 0[]  1[x]  2[]  3[]      10 Dysarthria 0[x]  1[]  2[]  UN[]      11 Extinct. and Inattention 0[x]  1[]  2[]       TOTAL: 13      ROS  Unable to ascertain due to current mental status.  Past History   Past Medical History:  Diagnosis Date   Diabetes mellitus without complication (HCC)    DVT (deep venous thrombosis) (HCC)    Embolus (HCC)    HTN (hypertension)     Past Surgical History:  Procedure Laterality Date   TOTAL HIP ARTHROPLASTY      Family History: Family History  Problem Relation Age of Onset   Bladder Cancer Brother     Social History  reports that he has never smoked. He has never used smokeless tobacco. He reports that he does not currently use alcohol . He reports that he does not use drugs.  Allergies  Allergen Reactions   Tylenol  [Acetaminophen ]     rash    Medications   Current Facility-Administered Medications:    sodium chloride  flush (NS) 0.9 % injection 3 mL, 3 mL, Intravenous, Once, Midge Golas, MD  Current Outpatient Medications:  amLODipine  (NORVASC ) 5 MG tablet, Take 5 mg by mouth daily., Disp: , Rfl:    ELIQUIS  5 MG TABS tablet, Take 1 tablet (5 mg total) by mouth 2 (two) times daily. Home med., Disp: , Rfl:    ezetimibe  (ZETIA ) 10 MG tablet, Take 10 mg by mouth daily., Disp: , Rfl:    fenofibrate  54 MG tablet, Take 54 mg by mouth daily., Disp: , Rfl:    glipiZIDE  (GLUCOTROL  XL) 5 MG 24 hr tablet, Take 5 mg by mouth daily., Disp: , Rfl:    ipratropium-albuterol  (DUONEB) 0.5-2.5 (3) MG/3ML SOLN, Take 3 mLs by nebulization every 4 (four) hours as needed., Disp: , Rfl:    levothyroxine  (SYNTHROID ) 25 MCG tablet, Take 25 mcg by mouth every morning., Disp: , Rfl:    Morphine Sulfate (MORPHINE CONCENTRATE) 10 mg / 0.5 ml concentrated solution, Take 50 mg by mouth every 4 (four) hours as needed., Disp: , Rfl:    omega-3  acid ethyl esters (LOVAZA ) 1 g capsule, Take 2 capsules by mouth 2 (two) times daily., Disp: , Rfl:    PREVIDENT 5000 PLUS 1.1 % CREA dental cream, Place 1 Application onto teeth at bedtime., Disp: , Rfl:    QUEtiapine  (SEROQUEL ) 25 MG tablet, Take 1 tablet (25 mg total) by mouth at bedtime., Disp: , Rfl:    rosuvastatin  (CRESTOR ) 10 MG tablet, Take 1 tablet by mouth daily., Disp: , Rfl:   Vitals  There were no vitals filed for this visit.  There is no height or weight on file to calculate BMI.  Physical Exam  General: He is awake, somewhat drowsy but opens eyes to voice. HEENT: Normocephalic atraumatic Lungs: Distant breath sounds, on supplemental oxygen Abdomen nondistended nontender Extremities with contractures on the feet, decreased bulk of muscles on the leg Neurological exam He is awake but somewhat drowsy.  Opens eyes to voice, follows simple commands.  No verbal output. Was able to stick his tongue out to command, raise his left arm to command, move his feet to command. Cranial nerves: Pupils are nearly pinpoint sluggishly reactive bilaterally, his gaze is disconjugate, he is able to look somewhat to the right more than he can towards the left, he blinks to threat from both sides, on smiling, he has clear right lower facial weakness.  Auditory acuity seems intact.  His tongue tends to be deviated slightly to the right. Motor examination: He has no movement in the right upper extremity and minimal twitch to noxious stimulation.  Left upper extremity is full strength for 10 seconds.  Both legs cannot be raised antigravity but he has some movement in both better on the left than right. Sensory exam: Intact to touch bilaterally Coordination difficult to assess given his mentation and weakness   Labs/Imaging/Neurodiagnostic studies   CBC:  Recent Labs  Lab 04/06/2023 0650  WBC 4.4  NEUTROABS 2.6  HGB 11.6*  11.6*  HCT 36.5*  34.0*  MCV 97.9  PLT 167   Basic Metabolic Panel:   Lab Results  Component Value Date   NA 136 04-06-2023   K 5.1 2023-04-06   CO2 31 01/15/2023   GLUCOSE 235 (H) 2023/04/06   BUN 39 (H) 04-06-23   CREATININE 2.10 (H) 06-Apr-2023   CALCIUM  8.7 (L) 01/15/2023   GFRNONAA 50 (L) 01/15/2023   Lipid Panel: No results found for: LDLCALC HgbA1c:  Lab Results  Component Value Date   HGBA1C 6.2 (H) 01/01/2023   Urine Drug Screen: No results found for: LABOPIA, COCAINSCRNUR,  LABBENZ, AMPHETMU, THCU, LABBARB  Alcohol  Level No results found for: [facility] INR  Lab Results  Component Value Date   INR 1.2 01/03/2023   APTT  Lab Results  Component Value Date   APTT >200 (HH) 01/04/2023   AED levels: No results found for: PHENYTOIN, ZONISAMIDE, LAMOTRIGINE, LEVETIRACETA  CT Head without contrast(Personally reviewed): Aspects 10.  Large bilateral subdural fluid collections stable from October.  No bleed. Extensive cerebral atrophy  CT angio Head and Neck with contrast(Personally reviewed): No emergent large vessel occlusion.  Negative CT perfusion.  Widespread intracranial and extracranial atherosclerosis including areas of hemodynamically significant stenosis perhaps most notably severe stenosis of the dominant right vertebral V4 segment which is a still supply to the basilar but no circle of Willis branch occlusion identified.  Aortic atherosclerosis.  ASSESSMENT  Troy Mueller is a 89 y.o. male  has a past medical history of Diabetes mellitus without complication (HCC), DVT (deep venous thrombosis) (HCC), Embolus (HCC), and HTN (hypertension).   Obesity hypoventilation syndrome, bedbound at baseline requiring 24/7 care modified Rankin of 5, atrial fibrillation on Eliquis , presented to the emergency department for evaluation of strokelike symptoms via [facility]. He had a sudden onset of right-sided gaze and right arm and leg weakness.  On Eliquis -not a candidate for IV thrombolysis-also was outside the  window. Poor functional baseline precludes EVT although CT angiography did not reveal any large vessel occlusion but his intracranial vasculature has multiple areas of significant stenosis both an anterior and posterior circulation.  Has had previous admissions for hypercarbic and hypoxic respiratory failure and that also seems to be part of the current clinical picture causing metabolic encephalopathy.  That says clinical picture is also concerning for a stroke, especially brainstem stroke given multiple cranial nerves involvement and right hemiparesis   Labs also reveal worse than baseline creatinine  Impression: Evaluate for stroke Evaluate for causes for toxic metabolic encephalopathy Evaluate for respiratory failure AKI on possible CKD   RECOMMENDATIONS  Admit to hospitalist Frequent neurochecks Stat ABG Telemetry MRI brain without contrast 2D echo A1c Lipid panel Permissive hypertension-allow blood pressures to be as high as 220 and treat only on a as needed basis for the next 24 to 48 hours. I would hold Eliquis  for now until the MRI is done.  Based on the MRI findings, would recommend the timing of Eliquis  continuation. PT OT Speech therapy N.p.o. until cleared by bedside swallow evaluation or formal swallow evaluation Management of other metabolic derangements per primary team Consider EEG if clinical picture is not explained by the workup above. This plan was discussed with Dr. Charlyn in the ER ______________________________________________________________________    Signed, Eligio Lav, MD [facility]   CRITICAL CARE ATTESTATION Performed by: Eligio Lav, MD Total critical care time: 50  minutes Critical care time was exclusive of separately billable procedures and treating other patients and/or supervising APPs/Residents/Students Critical care was necessary to treat or prevent imminent or life-threatening deterioration. This patient is  critically ill and at significant risk for neurological worsening and/or death and care requires constant monitoring. Critical care was time spent personally by me on the following activities: development of treatment plan with patient and/or surrogate as well as nursing, discussions with consultants, evaluation of patient's response to treatment, examination of patient, obtaining history from patient or surrogate, ordering and performing treatments and interventions, ordering and review of laboratory studies, ordering and review of radiographic studies, pulse oximetry, re-evaluation of patient's condition, participation in multidisciplinary rounds and medical  decision making of high complexity in the care of this patient.

## 2023-03-26 NOTE — ED Provider Triage Note (Signed)
 Emergency Medicine Provider Triage Evaluation Note  ZAYNE DRAHEIM III , a 89 y.o. male  was evaluated in triage.  Pt complains of possible.  Review of Systems  Positive: aphasia Negative: fever  Physical Exam  There were no vitals taken for this visit. Gen:   Awake/alert   Medical Decision Making  Medically screening exam initiated at 6:59 AM.  Appropriate orders placed.  Debby FORBES Monte III was informed that the remainder of the evaluation will be completed by another provider, this initial triage assessment does not replace that evaluation, and the importance of remaining in the ED until their evaluation is complete.  Patient arrives as code stroke, last known well 3 AM   Patient taken to CT scanner emergently.  Neurology Dr. Voncile with patient at this time   Midge Golas, MD 03/26/23 0700

## 2023-03-26 NOTE — Evaluation (Signed)
 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 He was kept NPO after failing Yale swallow with RN. 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 He was seen by SLP at [facility] during an admission in late October of 2024 with evaluation and treatment for dysphagia as well as speech/voice.  He is actively on hospice.    Assessment / Plan / Recommendation  Clinical Impression  Patient presents with clinical s/s of dysphagia as per this bedside swallow evaluation. Per caregivers in room, patient was eating regular texture solids and liquids with the exception of no salads. They followed aspiration precautions such as sitting up 30 minutes after eating as per recommenations from prior SLP evaluation. Patient was receptive to toothette sponges soaked in water, exhibiting a delayed swallow initiation but without overt s/s aspiration. He was hesitant to try any other PO's but eventually did consume PO's of puree solids (applesauce), nectar thick liquids, pudding thick liquids. He exhibited cough following spoon sips of nectar thick liquids but tolerated pudding thick liquids and puree solids without overt s/s  aspiration during or after PO intake. After discussion at length with patient while caregivers in room, he ultimately declined to have modified barium swallow study done. All in agreement with plan to start conservatively with full liquids/pudding thick consistency and see how he tolerates that. (applesauce was the one thing he did want to eat) SLP plans to revisit MBS discussion with patient next visit. SLP Visit Diagnosis: Dysphagia, unspecified (R13.10)    Aspiration Risk  Moderate aspiration risk    Diet Recommendation Pudding-thick liquid    Liquid Administration via: Spoon Medication Administration: Crushed with puree Supervision: Full supervision/cueing for compensatory strategies;Staff to assist with self feeding Compensations: Slow rate;Small sips/bites Postural Changes: Seated upright at 90 degrees;Remain upright for at least 30 minutes after po intake    Other  Recommendations Oral Care Recommendations: Oral care BID Caregiver Recommendations: Have oral suction available;Avoid jello, ice cream, thin soups, popsicles    Recommendations for follow up therapy are one component of a multi-disciplinary discharge planning process, led by the attending physician.  Recommendations may be updated based on patient status, additional functional criteria and insurance authorization.  Follow up Recommendations Other (comment) (SLP at next venue of care)      Assistance Recommended at Discharge    Functional Status Assessment Patient has had a recent decline in their functional status and demonstrates the ability to make significant improvements in function in a reasonable and predictable amount of time.  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 He was kept NPO after failing Yale swallow with RN. 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 He was seen by SLP at [facility] during an admission in late October of 2024 with evaluation and treatment for dysphagia as well as speech/voice.  He is actively on hospice. Type of Study: Bedside Swallow Evaluation Previous Swallow Assessment: during admission at [facility] in October 2024 Diet Prior to this Study: NPO Temperature Spikes Noted: No Respiratory Status: Room air History of Recent Intubation: No Behavior/Cognition: Alert;Cooperative;Pleasant mood Oral Cavity Assessment: Within Functional Limits Oral Care Completed by SLP: Yes Oral Cavity - Dentition: Adequate natural dentition Self-Feeding Abilities: Total assist Baseline Vocal Quality: Other (comment) (somewhat strained) Volitional Cough: Weak Volitional Swallow: Able to elicit    Oral/Motor/Sensory Function Overall Oral Motor/Sensory Function: Moderate impairment Facial ROM: Reduced right;Suspected CN VII (facial) dysfunction Facial Symmetry: Abnormal symmetry right;Suspected CN VII (facial) dysfunction Facial Strength: Reduced right Lingual ROM: Reduced right Lingual Strength: Reduced Velum: Within Functional Limits Mandible: Within Functional Limits   Ice Chips     Thin Liquid Thin Liquid: Impaired Pharyngeal  Phase Impairments: Suspected delayed Swallow;Decreased hyoid-laryngeal movement    Nectar Thick Nectar Thick Liquid: Impaired Presentation: Spoon Pharyngeal Phase Impairments: Suspected delayed Swallow;Cough - Immediate   Honey Thick     Puree Puree: Impaired Pharyngeal Phase Impairments: Suspected delayed Swallow;Decreased hyoid-laryngeal movement   Solid     Solid: Not tested      Norleen IVAR Blase, MA, CCC-SLP Speech Therapy

## 2023-03-26 NOTE — H&P (Addendum)
 Date: 03/26/2023               Patient Name:  Troy Mueller MRN: [id number]  DOB: Apr 22, 1932 Age / Sex: 89 y.o., male   PCP: Diedra Lame, MD         Medical Service: Internal Medicine Teaching Service         Attending Physician: Dr. Karna Fellows, MD      First Contact: Dr. Damien Lease, DO Pager [phone]    Second Contact: Dr. 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   Per sign out; patient's last known well was 12AM prior to him going to sleep. He woke up at 3am and was having difficulty walking and right sided weakness. EMS was called and he was transported to the hospital. He had prior acute encephalopathy admissions, the last admission was due to acute on chronic respiratory failure with hypoxia and hypercapnia.   Patient is unable to communicate in the emergency department, he is awake and alert.   Review of Systems: Unable to complete due to mentation and aphasia  Past Medical History: Permanent Atrial Fibrillation T2DM Severe OSA on BiPAP at night Senile degeneration of the brain, on hospice  H/o PE/DVT with IVC filter in place  Hypothyroidism Essential HTN  Meds: Per vchart review, will need to confirm medication regimen  amLODipine  (NORVASC ) 5 MG tablet  Aspirin -Salicylamide-Caffeine   ELIQUIS  5 MG TABS tablet  Ezetimibe  (ZETIA ) 10 MG tablet Fenofibrate  54 MG tablet GlipiZIDE  (GLUCOTROL  XL) 5 MG  24 hr tablet Ipratropium-albuterol  (DUONEB) 0.5-2.5 (3) MG/3ML SOLN  Levothyroxine  (SYNTHROID ) 25 MCG tablet Omega-3 acid ethyl esters (LOVAZA ) 1 g capsule  PREVIDENT 5000 PLUS 1.1 % CREA dental cream  QUEtiapine  (SEROQUEL ) 25 MG tablet  Rosuvastatin  (CRESTOR ) 10 MG tablet  zolpidem  (AMBIEN ) 10 MG tablet     Allergies: Allergies as of 03/26/2023 - Review Complete 03/26/2023  Allergen Reaction Noted   Tylenol  [acetaminophen ]  01/20/2022    Past Surgical History:  Procedure Laterality Date   TOTAL HIP ARTHROPLASTY      Social:  Lives at home, has around the clock caregivers  Occupation: Retired MD  Support:Family, friends Level of Function:wheelchair, dependent  ERE:Ajajnqq, Lame, MD Substances:No substance use per chart review   Family History:  Brother: bladder cancer   OBJECTIVE:   Physical Exam: Blood pressure (!) 144/72, pulse 61, temperature (!) 97.1 F (36.2 C), temperature source Axillary, resp. rate 19, SpO2 98%.  Constitutional: Chronically ill-appearing gentleman, lying in bed. HENT: Mucous membranes dry, normocephalic, atraumatic Cardiovascular: Irregularly irregular rhythm, regular rate, no murmurs appreciated Pulmonary/Chest: normal work of breathing on room air, not in acute respiratory distress, no tachypnea Abdominal: soft, non-tender, non-distended Neurological:  -Pupils are pinpoint, sluggishly reactive to light -Extraocular eye movements are not intact: It appears that patient's gaze is restricted to the right, patient is able to look left without difficulty -Patient was unable to smile  on command to appreciate a facial droop, patient is dysarthric with aphasia present -Patient is able to close eyes, appears that peripheral cranial nerve VII is intact -Uvula is not deviated -Right cranial nerve XI weakness on shoulder shrug and noted -Patient has cerebellar function intact (finger-nose to finger testing) with left upper extremity, unable to test  with right upper extremity due to overt weakness -Right upper extremity motor strength 0/5, left upper extremity strength appears intact at 5 out of 5 -Left lower extremity motor strength appears intact, right lower extremity motor strength is 2 out of 5 -Bilateral Babinski present -Patient is dysarthric with expressive aphasia present, able to follow commands MSK: Bilateral Charcot deformity of feet present. No pitting edema Skin: warm and dry Psych: Unable to assess  Labs:    Latest Ref Rng & Units 03/26/2023    8:50 AM 03/26/2023    6:50 AM 01/13/2023    5:29 AM  CBC  WBC 4.0 - 10.5 K/uL  4.4  3.9   Hemoglobin 13.0 - 17.0 g/dL 88.3  88.3    88.3  88.9   Hematocrit 39.0 - 52.0 % 34.0  36.5    34.0  36.3   Platelets 150 - 400 K/uL  167  134         Latest Ref Rng & Units 03/26/2023    8:50 AM 03/26/2023    6:50 AM 01/15/2023    6:12 AM  CMP  Glucose 70 - 99 mg/dL 70 - 99 mg/dL  761    764  86   BUN 8 - 23 mg/dL 8 - 23 mg/dL  35    39  30   Creatinine 0.61 - 1.24 mg/dL 9.38 - 8.75 mg/dL  7.97    7.89  8.62   Sodium 135 - 145 mmol/L 135  137    136  139   Potassium 3.5 - 5.1 mmol/L 5.0  5.1    5.1  4.9   Chloride 98 - 111 mmol/L 98 - 111 mmol/L  103    105  102   CO2 22 - 32 mmol/L  23  31   Calcium  8.9 - 10.3 mg/dL  9.5  8.7   Total Protein 6.5 - 8.1 g/dL  6.3    Total Bilirubin 0.0 - 1.2 mg/dL  0.6    Alkaline Phos 38 - 126 U/L  37    AST 15 - 41 U/L  22    ALT 0 - 44 U/L  12       VBG: pH: 7.386 pCO2: 43.9 (L) pO2: 59  Imaging: CT ANGIO HEAD NECK W WO CM W PERF (CODE STROKE) Result Date: 03/26/2023 CLINICAL DATA:  89 year old male code stroke. Slurred speech and right facial droop. EXAM: CT ANGIOGRAPHY HEAD AND NECK CT PERFUSION BRAIN TECHNIQUE: Multidetector CT imaging of the head and neck was performed using the standard protocol during bolus administration of intravenous contrast. Multiplanar CT image reconstructions and MIPs were obtained to evaluate the  vascular anatomy. Carotid stenosis measurements (when applicable) are obtained utilizing NASCET criteria, using the distal internal carotid diameter as the denominator. Multiphase CT imaging of the brain was performed following IV bolus contrast injection. Subsequent parametric perfusion maps were calculated using RAPID software. RADIATION DOSE REDUCTION: This exam was performed according to the departmental dose-optimization program which includes automated exposure control, adjustment of the mA and/or kV according to patient size and/or use of iterative reconstruction technique. CONTRAST:  75mL OMNIPAQUE  IOHEXOL  350 MG/ML SOLN COMPARISON:  Head CT 0655 hours today. FINDINGS: CT Brain Perfusion Findings: ASPECTS: 10 CBF (<30%) Volume: 0mL.  No abnormal CBF parameter. Perfusion (Tmax>6.0s) volume: 20mL, but appears peripheral and artifactual probably associated with the chronic extra-axial fluid. Mismatch Volume: None when allowing for artifact. Infarction Location:No infarct core detected. CTA NECK Skeleton: Cervical spine and right TMJ degeneration. Levels of degenerative cervical ankylosis. No acute osseous abnormality identified. Upper chest: Atelectatic changes to the trachea but upper lung ventilation is relatively normal. No superior mediastinal lymphadenopathy. Other neck: Retropharyngeal left carotid artery.  No acute finding. Aortic arch: Calcified aortic atherosclerosis.  Bovine arch. Right carotid system: Mild brachiocephalic artery plaque without stenosis. Calcified plaque at the right CCA origin and tortuosity without stenosis. Mild calcified plaque at the right carotid bifurcation. Mild tortuosity and no stenosis. Left carotid system: Bovine left CCA origin without plaque or stenosis. Calcified plaque along the anterior left CCA without stenosis. Bulky calcified plaque at the left ICA origin with numerically estimated 60% stenosis with respect to the distal vessel. Retropharyngeal course of the left  ICA with tortuosity, no additional stenosis to the skull base. Vertebral arteries: Calcified right subclavian artery origin and tortuosity without significant stenosis. Normal right vertebral artery origin. Right vertebral artery appears dominant and patent to the skull base with no significant plaque or stenosis. Proximal left subclavian artery calcified plaque with less than 50% stenosis. Mild calcified plaque at the left vertebral artery origin without stenosis. Non dominant left vertebral artery is patent with no significant plaque or stenosis to the skull base. CTA HEAD Posterior circulation: Dominant right vertebral artery supplies the basilar, left vertebral terminates in PICA. Bulky right V4 circumferential calcified plaque and Severe right V4 stenosis on series 5, image 162. Distal vertebral and basilar artery origin are patent. Right AICA origin is patent. Patent basilar artery with mild plaque and no stenosis. Fetal right PCA origin. Tortuous left P1 segment. SCA and PCA origins are patent. Bilateral PCA branches are patent and within normal limits. Anterior circulation: Both ICA siphons are patent. Moderate left siphon calcified plaque but only mild left siphon stenosis. Right siphon widespread and moderate calcified plaque, circumferential at the distal cavernous and supraclinoid segments with mild to moderate subsequent stenosis. Right posterior communicating artery origin remains normal. Patent carotid termini. Patent MCA origins. Dominant left and diminutive or absent right ACA A1 segments. No left A1 stenosis. Calcified atherosclerosis at the A2 origin. Azygous type ACA anatomy. No convincing ACA branch occlusion or significant stenosis. Left MCA M1 segment is patent with ectatic and calcified left MCA trifurcation (series 10, image 141) which remains patent without high-grade stenosis. Right MCA origin is calcified with mild stenosis. Right MCA M1 segment is patent without stenosis. Tortuous right  MCA bifurcation with bulky calcification (series 10, image 68) but only mild associated stenosis. No definite MCA branch occlusion. There is moderate bilateral MCA branch irregularity and stenoses including M2 segments on the left and M3 segments on the right (series 13, image 15. No discrete intracranial aneurysm. Venous sinuses: Early contrast timing, grossly patent. Anatomic variants: Dominant right vertebral artery supplies the basilar, left terminates in PICA. Dominant left and diminutive or absent right ACA A1 segments. Review of the MIP images confirms the above findings IMPRESSION: 1. CTA is negative for large vessel occlusion. Negative CT Perfusion; Tmax artifact suspected secondary to chronic subdural fluid collections. 2. Widespread intracranial and extracranial atherosclerosis, including areas of hemodynamically significant stenosis (perhaps most notably Severe Stenosis of the dominant Right Vertebral V4 segment  which is the sole supply to the Basilar). But no circle-of-Willis branch occlusion is identified. 3.  Aortic Atherosclerosis (ICD10-I70.0). Study discussed by telephone with Dr. Arora on 03/26/2023 at 07:19 . Electronically Signed   By: VEAR Hurst M.D.   On: 03/26/2023 07:23   CT HEAD CODE STROKE WO CONTRAST Result Date: 03/26/2023 CLINICAL DATA:  Code stroke. 89 year old male with slurred speech and right facial droop. EXAM: CT HEAD WITHOUT CONTRAST TECHNIQUE: Contiguous axial images were obtained from the base of the skull through the vertex without intravenous contrast. RADIATION DOSE REDUCTION: This exam was performed according to the departmental dose-optimization program which includes automated exposure control, adjustment of the mA and/or kV according to patient size and/or use of iterative reconstruction technique. COMPARISON:  Head CT 01/01/2023. FINDINGS: Brain: Chronic bilateral low-density subdural fluid collections superimposed on brain volume loss (coronal image 50), stable since  October. No midline shift or acute intracranial mass effect. No ventriculomegaly. Basilar cisterns remain patent. Bulky dural calcifications incidentally noted. No acute intracranial hemorrhage identified. Stable and largely normal for age gray-white differentiation. No cortically based acute infarct identified. Vascular: Chronic severe skull base and bilateral MCA calcified atherosclerosis. Chronic MCA tortuosity. No suspicious intracranial vascular hyperdensity. Skull: Mild skull base motion artifact. No acute osseous abnormality identified. Sinuses/Orbits: Visualized paranasal sinuses and mastoids are stable and well aerated. Other: No gaze deviation.  No acute scalp soft tissue finding. ASPECTS [facility] Stroke Program Early CT Score) Total score (0-10 with 10 being normal): 10 IMPRESSION: 1. No acute cortically based infarct or acute intracranial hemorrhage identified. ASPECTS 10. 2. 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 No midline shift or acute intracranial mass effect. 3. Chronic severe intracranial calcified atherosclerosis. No suspicious vessel hyperdensity. These results were communicated to Dr. Arora at 7:03 am on 03/26/2023 by text page via the [facility] messaging system. Electronically Signed   By: VEAR Hurst M.D.   On: 03/26/2023 07:03      EKG: Not performed yet   ASSESSMENT & PLAN:   Assessment & Plan by Problem: Principal Problem:   Acute encephalopathy   Troy Mueller is a 89 y.o. 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   # Acute encephalopathy Patient presents emergency department with a last known well time of 12 AM.  I suspect that patient's acute  encephalopathy is most likely due to CVA at this time.  He has focal neurological deficits including cranial nerve deficits.  CT noncontrast and CTA did not reveal any acute infarct: Will need an MRI to further evaluate presence of an acute infarct.  Less likely to be infectious causes, patient is afebrile and does not have a leukocytosis.  Less likely to be due to hypercapnia, CO2 on a 43.9.  Less likely to be due to liver impairment, or uremia; AST and ALT are within normal limits and BUN is 35.  Ethanol level this time is also within normal limits. Allow for permissive HTN. Plan: -Obtain MRI -Echocardiogram -Lipid Panel  -A1c  -Continue telemetry  -Neurology consulted, appreciate their recommendations -PT/OT -SLP -NPO -Aspiration precautions   # Permanent atrial fibrillation Patient is not on rate control home, he does take eliquis  for [facility].  -Will hold eliquis   until after we are able to obtain MRI  # Severe obstructive sleep apnea Patient has severe struct of sleep apnea and uses home CPAP at night. -Will order BiPAP nightly here  # Type 2 diabetes mellitus Appears that patient is taking glipizide  5 mg daily.  Will hold off on restarting home regimen.  Will monitor with morning CBGs and begin SSI's as necessary.  # Hospice Patient is on hospice for senile degeneration of the brain, he is full code, he is on a home regimen of seroquel  25 mg nightly.  -Will hold off on oral medications   # History of DVT and PE Has an IVC filter, on eliquis  5mg  BID.   #Hypothyroidism  On Synthroid  25 mcg, will hold on oral medications for now   Diet: NPO VTE: Will hold until MRI is obtained, possibly restart eliquis  afterwards; SCDs Code: Full, daughter is POA   Prior to Admission Living Arrangement: Home with 24/7 hour care Anticipated Discharge Location: Home with 24/7 hour care Barriers to Discharge: Medical therapy   Dispo: Admit patient to Inpatient with expected length of stay greater  than 2 midnights.  Signed:   Damien Lease, DO Internal Medicine Resident PGY-1 03/26/2023, 9:22 AM   Please contact the on call pager after 5 pm and on weekends at [phone].

## 2023-03-26 NOTE — ED Provider Notes (Addendum)
 [place] EMERGENCY DEPARTMENT AT [facility] Provider Note   CSN: [id number] Arrival date & time: 03/26/23  0645     History  Chief Complaint  Patient presents with   Code Stroke    Troy Mueller is a 89 y.o. male.  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  History provided by EMS and thereafter by neurology service, Dr. Deedra spoke with the caregivers.  According to EMS, patient last known normal at 3:00.  Per caregivers, patient last known normal around midnight actually when he went to sleep.  At 3 AM they noted that he was having some difficulty with walking and this morning he was found to be worse.  Patient unable to speak or articulate.  He is following simple commands.   Home Medications Prior to Admission medications   Medication Sig Start Date End Date Taking? Authorizing Provider  amLODipine  (NORVASC ) 5 MG tablet Take 5 mg by mouth daily.    [provider]  ELIQUIS  5 MG TABS tablet Take 1 tablet (5 mg total) by mouth 2 (two) times daily. Home med. 01/15/23   Laurita Pillion, MD  ezetimibe  (ZETIA ) 10 MG tablet Take 10 mg by mouth daily.    [provider]  fenofibrate  54 MG tablet Take 54 mg by mouth daily.    [provider]  glipiZIDE  (GLUCOTROL  XL) 5 MG 24 hr tablet Take 5 mg by mouth daily.    [provider]  ipratropium-albuterol  (DUONEB) 0.5-2.5 (3) MG/3ML SOLN Take 3 mLs by nebulization every 4 (four) hours as needed. 01/12/23   [provider]  levothyroxine  (SYNTHROID ) 25 MCG tablet Take 25 mcg by mouth every morning.    [provider]  Morphine Sulfate (MORPHINE CONCENTRATE) 10 mg / 0.5 ml concentrated solution Take 50  mg by mouth every 4 (four) hours as needed. 01/11/23   [provider]  omega-3 acid ethyl esters (LOVAZA ) 1 g capsule Take 2 capsules by mouth 2 (two) times daily. 12/30/21   [provider]  PREVIDENT 5000 PLUS 1.1 % CREA dental cream Place 1 Application onto teeth at bedtime. 01/12/22   [provider]  QUEtiapine  (SEROQUEL ) 25 MG tablet Take 1 tablet (25 mg total) by mouth at bedtime. 01/15/23   Laurita Pillion, MD  rosuvastatin  (CRESTOR ) 10 MG tablet Take 1 tablet by mouth daily. 12/09/21   [provider]      Allergies    Tylenol  [acetaminophen ]    Review of Systems   Review of Systems  Physical Exam Updated Vital Signs BP 133/62   Pulse 63   Temp (!) 97.1 F (36.2 C) (Axillary)   Resp 16   SpO2 95%  Physical Exam Vitals and nursing note reviewed.  Constitutional:      Appearance: He is well-developed. He is obese.  HENT:     Head: Atraumatic.  Eyes:     Extraocular Movements: Extraocular movements intact.     Pupils: Pupils are equal, round, and reactive to light.  Cardiovascular:     Rate and Rhythm: Normal rate.  Pulmonary:     Effort: Pulmonary effort is normal.  Musculoskeletal:     Cervical back: Neck supple.  Skin:    General: Skin is warm.  Neurological:     Mental Status: He is alert.     Comments: Unable to speak, aware he is in the hospital. Patient noted to have right sided upper extremity paralysis and right lower extremity strength is 1+/5.  When he tries to speak, right-sided droop noted.     ED Results / Procedures / Treatments   Labs (all labs ordered are listed, but only abnormal results are displayed) Labs Reviewed  CBC - Abnormal; Notable for the following components:      Result Value   RBC 3.73 (*)    Hemoglobin 11.6 (*)    HCT 36.5 (*)    All other components within normal limits  COMPREHENSIVE METABOLIC PANEL - Abnormal; Notable for the following components:   Glucose, Bld 238 (*)    BUN 35 (*)     Creatinine, Ser 2.02 (*)    Total Protein 6.3 (*)    Albumin 3.3 (*)    Alkaline Phosphatase 37 (*)    GFR, Estimated 31 (*)    All other components within normal limits  I-STAT CHEM 8, ED - Abnormal; Notable for the following components:   BUN 39 (*)    Creatinine, Ser 2.10 (*)    Glucose, Bld 235 (*)    Calcium , Ion 1.13 (*)    Hemoglobin 11.6 (*)    HCT 34.0 (*)    All other components within normal limits  CBG MONITORING, ED - Abnormal; Notable for the following components:   Glucose-Capillary 206 (*)    All other components within normal limits  CBG MONITORING, ED - Abnormal; Notable for the following components:   Glucose-Capillary 229 (*)    All other components within normal limits  RESP PANEL BY RT-PCR (RSV, FLU A&B, COVID)  RVPGX2  PROTIME-INR  APTT  DIFFERENTIAL  ETHANOL  BLOOD GAS, VENOUS    EKG None  ED ECG REPORT   Date: 03/26/2023  Rate: 57  Rhythm: atrial fibrillation  QRS Axis: normal  Intervals: normal  ST/T Wave abnormalities: normal  Conduction Disutrbances:none  Narrative Interpretation:   Old EKG Reviewed: unchanged  I have personally reviewed the EKG tracing and agree with the computerized printout as noted.   Radiology CT ANGIO HEAD NECK W WO CM W PERF (CODE STROKE) Result Date: 03/26/2023 CLINICAL DATA:  89 year old male code stroke. Slurred speech and right facial droop. EXAM: CT ANGIOGRAPHY HEAD AND NECK CT PERFUSION BRAIN TECHNIQUE: Multidetector CT imaging of the head and neck was performed using the standard protocol during bolus administration of intravenous contrast. Multiplanar CT image reconstructions and MIPs were obtained to evaluate the vascular anatomy. Carotid stenosis measurements (when applicable) are obtained utilizing NASCET criteria, using the distal internal carotid diameter as the denominator. Multiphase CT imaging of the brain was performed following IV bolus contrast injection. Subsequent parametric perfusion maps were  calculated using RAPID software. RADIATION DOSE REDUCTION: This exam was performed according to the departmental dose-optimization program which includes automated exposure control, adjustment of the mA and/or kV according to patient size and/or use of iterative reconstruction technique. CONTRAST:  75mL OMNIPAQUE  IOHEXOL  350 MG/ML SOLN COMPARISON:  Head CT 0655 hours today. FINDINGS: CT Brain Perfusion Findings: ASPECTS: 10 CBF (<30%) Volume: 0mL.  No abnormal CBF parameter. Perfusion (Tmax>6.0s) volume: 20mL, but appears peripheral and artifactual probably associated with the chronic extra-axial fluid. Mismatch Volume: None when allowing for artifact. Infarction Location:No infarct core detected. CTA NECK Skeleton: Cervical spine and right TMJ degeneration. Levels of degenerative cervical ankylosis. No acute  osseous abnormality identified. Upper chest: Atelectatic changes to the trachea but upper lung ventilation is relatively normal. No superior mediastinal lymphadenopathy. Other neck: Retropharyngeal left carotid artery.  No acute finding. Aortic arch: Calcified aortic atherosclerosis.  Bovine arch. Right carotid system: Mild brachiocephalic artery plaque without stenosis. Calcified plaque at the right CCA origin and tortuosity without stenosis. Mild calcified plaque at the right carotid bifurcation. Mild tortuosity and no stenosis. Left carotid system: Bovine left CCA origin without plaque or stenosis. Calcified plaque along the anterior left CCA without stenosis. Bulky calcified plaque at the left ICA origin with numerically estimated 60% stenosis with respect to the distal vessel. Retropharyngeal course of the left ICA with tortuosity, no additional stenosis to the skull base. Vertebral arteries: Calcified right subclavian artery origin and tortuosity without significant stenosis. Normal right vertebral artery origin. Right vertebral artery appears dominant and patent to the skull base with no significant  plaque or stenosis. Proximal left subclavian artery calcified plaque with less than 50% stenosis. Mild calcified plaque at the left vertebral artery origin without stenosis. Non dominant left vertebral artery is patent with no significant plaque or stenosis to the skull base. CTA HEAD Posterior circulation: Dominant right vertebral artery supplies the basilar, left vertebral terminates in PICA. Bulky right V4 circumferential calcified plaque and Severe right V4 stenosis on series 5, image 162. Distal vertebral and basilar artery origin are patent. Right AICA origin is patent. Patent basilar artery with mild plaque and no stenosis. Fetal right PCA origin. Tortuous left P1 segment. SCA and PCA origins are patent. Bilateral PCA branches are patent and within normal limits. Anterior circulation: Both ICA siphons are patent. Moderate left siphon calcified plaque but only mild left siphon stenosis. Right siphon widespread and moderate calcified plaque, circumferential at the distal cavernous and supraclinoid segments with mild to moderate subsequent stenosis. Right posterior communicating artery origin remains normal. Patent carotid termini. Patent MCA origins. Dominant left and diminutive or absent right ACA A1 segments. No left A1 stenosis. Calcified atherosclerosis at the A2 origin. Azygous type ACA anatomy. No convincing ACA branch occlusion or significant stenosis. Left MCA M1 segment is patent with ectatic and calcified left MCA trifurcation (series 10, image 141) which remains patent without high-grade stenosis. Right MCA origin is calcified with mild stenosis. Right MCA M1 segment is patent without stenosis. Tortuous right MCA bifurcation with bulky calcification (series 10, image 68) but only mild associated stenosis. No definite MCA branch occlusion. There is moderate bilateral MCA branch irregularity and stenoses including M2 segments on the left and M3 segments on the right (series 13, image 15. No discrete  intracranial aneurysm. Venous sinuses: Early contrast timing, grossly patent. Anatomic variants: Dominant right vertebral artery supplies the basilar, left terminates in PICA. Dominant left and diminutive or absent right ACA A1 segments. Review of the MIP images confirms the above findings IMPRESSION: 1. CTA is negative for large vessel occlusion. Negative CT Perfusion; Tmax artifact suspected secondary to chronic subdural fluid collections. 2. Widespread intracranial and extracranial atherosclerosis, including areas of hemodynamically significant stenosis (perhaps most notably Severe Stenosis of the dominant Right Vertebral V4 segment which is the sole supply to the Basilar). But no circle-of-Willis branch occlusion is identified. 3.  Aortic Atherosclerosis (ICD10-I70.0). Study discussed by telephone with Dr. Arora on 03/26/2023 at 07:19 . Electronically Signed   By: VEAR Hurst M.D.   On: 03/26/2023 07:23   CT HEAD CODE STROKE WO CONTRAST Result Date: 03/26/2023 CLINICAL DATA:  Code stroke. 89 year old male with  slurred speech and right facial droop. EXAM: CT HEAD WITHOUT CONTRAST TECHNIQUE: Contiguous axial images were obtained from the base of the skull through the vertex without intravenous contrast. RADIATION DOSE REDUCTION: This exam was performed according to the departmental dose-optimization program which includes automated exposure control, adjustment of the mA and/or kV according to patient size and/or use of iterative reconstruction technique. COMPARISON:  Head CT 01/01/2023. FINDINGS: Brain: Chronic bilateral low-density subdural fluid collections superimposed on brain volume loss (coronal image 50), stable since October. No midline shift or acute intracranial mass effect. No ventriculomegaly. Basilar cisterns remain patent. Bulky dural calcifications incidentally noted. No acute intracranial hemorrhage identified. Stable and largely normal for age gray-white differentiation. No cortically based acute  infarct identified. Vascular: Chronic severe skull base and bilateral MCA calcified atherosclerosis. Chronic MCA tortuosity. No suspicious intracranial vascular hyperdensity. Skull: Mild skull base motion artifact. No acute osseous abnormality identified. Sinuses/Orbits: Visualized paranasal sinuses and mastoids are stable and well aerated. Other: No gaze deviation.  No acute scalp soft tissue finding. ASPECTS [facility] Stroke Program Early CT Score) Total score (0-10 with 10 being normal): 10 IMPRESSION: 1. No acute cortically based infarct or acute intracranial hemorrhage identified. ASPECTS 10. 2. 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 No midline shift or acute intracranial mass effect. 3. Chronic severe intracranial calcified atherosclerosis. No suspicious vessel hyperdensity. These results were communicated to Dr. Arora at 7:03 am on 03/26/2023 by text page via the [facility] messaging system. Electronically Signed   By: VEAR Hurst M.D.   On: 03/26/2023 07:03    Procedures .Critical Care  Performed by: Charlyn Sora, MD Authorized by: Charlyn Sora, MD   Critical care provider statement:    Critical care time (minutes):  48   Critical care was time spent personally by me on the following activities:  Development of treatment plan with patient or surrogate, discussions with consultants, evaluation of patient's response to treatment, examination of patient, ordering and review of laboratory studies, ordering and review of radiographic studies, ordering and performing treatments and interventions, pulse oximetry, re-evaluation of patient's condition and review of old charts     Medications Ordered in ED Medications  sodium chloride  flush (NS) 0.9 % injection 3 mL (3 mLs Intravenous Given 03/26/23 0734)  iohexol  (OMNIPAQUE ) 350 MG/ML injection 75 mL (75 mLs Intravenous Contrast Given 03/26/23 0703)    ED Course/ Medical Decision Making/ A&P                                  Medical Decision Making Amount and/or Complexity of Data Reviewed Labs: ordered. Radiology: ordered.  Risk Decision regarding hospitalization.   This patient presents to the ED with chief complaint(s) of acute neurologic changes, right-sided weakness with pertinent past medical history of A-fib/DVT, PE on Eliquis , diabetes, hypertension, respiratory failure history.patient has poor functional baseline, is managed by hospice at home.  He is full code.  The complaint involves an extensive differential diagnosis and also carries with it a high risk of complications and morbidity.    The differential diagnosis includes : Stroke - ischemic vs. Hemorrhagic versus embolic TIA Neuropathy Myelitis Seizures Electrolyte abnormality   Additional history obtained: Additional history obtained from EMS  Records reviewed previous admission documents  Independent labs interpretation:  The following labs were independently interpreted: CBC, metabolic profile overall reassuring. Creatinine has jumped from 1.37-2.02.  Independent visualization and interpretation of imaging: - I independently  visualized the following imaging with scope of interpretation limited to determining acute life threatening conditions related to emergency care: CT scan of the brain, which revealed no evidence of brain bleed   Consultation: - Consulted or discussed management/test interpretation with external professional: Neurology service.  Patient came in as a code stroke, therefore neurology let the management immediately upon patient's arrival.  Not a candidate for TNK, given that he is outside the window.  No LVO noted on CT angiogram.  At this time, the plan is to admit the patient.  VBG has been ordered.  COVID-19 test ordered.  Needs swallow evaluation before aspirin  can be ordered. I will also consult his hospice service about him being here.  Goals of care will likely need revisiting.  Final Clinical  Impression(s) / ED Diagnoses Final diagnoses:  Acute ischemic stroke (HCC)  AKI (acute kidney injury) [facility])    Rx / DC Orders ED Discharge Orders     None         Charlyn Sora, MD 03/26/23 9168    Charlyn Sora, MD 03/26/23 9167    Charlyn Sora, MD 03/26/23 [phone]

## 2023-03-27 ENCOUNTER — Inpatient Hospital Stay (HOSPITAL_COMMUNITY)

## 2023-03-27 ENCOUNTER — Other Ambulatory Visit: Payer: Self-pay

## 2023-03-27 DIAGNOSIS — G4733 Obstructive sleep apnea (adult) (pediatric): Secondary | ICD-10-CM | POA: Diagnosis not present

## 2023-03-27 DIAGNOSIS — Z515 Encounter for palliative care: Secondary | ICD-10-CM

## 2023-03-27 DIAGNOSIS — I639 Cerebral infarction, unspecified: Secondary | ICD-10-CM | POA: Diagnosis not present

## 2023-03-27 DIAGNOSIS — N179 Acute kidney failure, unspecified: Secondary | ICD-10-CM | POA: Diagnosis not present

## 2023-03-27 DIAGNOSIS — E1149 Type 2 diabetes mellitus with other diabetic neurological complication: Secondary | ICD-10-CM

## 2023-03-27 LAB — CBC
HCT: 38.4 % — ABNORMAL LOW (ref 39.0–52.0)
Hemoglobin: 12.6 g/dL — ABNORMAL LOW (ref 13.0–17.0)
MCH: 31.3 pg (ref 26.0–34.0)
MCHC: 32.8 g/dL (ref 30.0–36.0)
MCV: 95.3 fL (ref 80.0–100.0)
Platelets: 175 10*3/uL (ref 150–400)
RBC: 4.03 MIL/uL — ABNORMAL LOW (ref 4.22–5.81)
RDW: 13.8 % (ref 11.5–15.5)
WBC: 5.6 10*3/uL (ref 4.0–10.5)
nRBC: 0 % (ref 0.0–0.2)

## 2023-03-27 LAB — BASIC METABOLIC PANEL
Anion gap: 9 (ref 5–15)
BUN: 26 mg/dL — ABNORMAL HIGH (ref 8–23)
CO2: 25 mmol/L (ref 22–32)
Calcium: 9.7 mg/dL (ref 8.9–10.3)
Chloride: 103 mmol/L (ref 98–111)
Creatinine, Ser: 1.78 mg/dL — ABNORMAL HIGH (ref 0.61–1.24)
GFR, Estimated: 36 mL/min — ABNORMAL LOW (ref >60)
Glucose, Bld: 169 mg/dL — ABNORMAL HIGH (ref 70–99)
Potassium: 4.9 mmol/L (ref 3.5–5.1)
Sodium: 137 mmol/L (ref 135–145)

## 2023-03-27 LAB — GLUCOSE, CAPILLARY: Glucose-Capillary: 187 mg/dL — ABNORMAL HIGH (ref 70–99)

## 2023-03-27 MED ORDER — POLYETHYLENE GLYCOL 3350 17 G PO PACK
17.0000 g | PACK | Freq: Every day | ORAL | Status: DC
  Administered 2023-03-27 – 2023-03-31 (×5): 17 g via ORAL
  Filled 2023-03-27 (×5): qty 1

## 2023-03-27 MED ORDER — LACTATED RINGERS IV SOLN: INTRAVENOUS | Status: AC

## 2023-03-27 MED ORDER — POLYVINYL ALCOHOL 1.4 % OP SOLN
1.0000 [drp] | OPHTHALMIC | Status: DC | PRN
  Administered 2023-03-27 – 2023-03-29 (×2): 1 [drp] via OPHTHALMIC
  Filled 2023-03-27: qty 15

## 2023-03-27 MED ORDER — ZOLPIDEM TARTRATE 5 MG PO TABS
5.0000 mg | ORAL_TABLET | Freq: Every evening | ORAL | Status: DC | PRN
  Administered 2023-03-27 – 2023-03-30 (×4): 5 mg via ORAL
  Filled 2023-03-27 (×4): qty 1

## 2023-03-27 MED ORDER — ROSUVASTATIN CALCIUM 5 MG PO TABS
10.0000 mg | ORAL_TABLET | Freq: Every day | ORAL | Status: DC
  Administered 2023-03-27 – 2023-03-31 (×5): 10 mg via ORAL
  Filled 2023-03-27 (×6): qty 2

## 2023-03-27 MED ORDER — QUETIAPINE FUMARATE 100 MG PO TABS
100.0000 mg | ORAL_TABLET | Freq: Every day | ORAL | Status: DC
Start: 2023-03-27 — End: 2023-03-31
  Administered 2023-03-27 – 2023-03-30 (×4): 100 mg via ORAL
  Filled 2023-03-27 (×4): qty 1

## 2023-03-27 MED ORDER — APIXABAN 2.5 MG PO TABS
2.5000 mg | ORAL_TABLET | Freq: Two times a day (BID) | ORAL | Status: DC
  Administered 2023-03-27 – 2023-03-31 (×8): 2.5 mg via ORAL
  Filled 2023-03-27 (×10): qty 1

## 2023-03-27 MED ORDER — CARMEX CLASSIC LIP BALM EX OINT
1.0000 | TOPICAL_OINTMENT | CUTANEOUS | Status: DC | PRN
  Filled 2023-03-27: qty 10

## 2023-03-27 NOTE — Procedures (Signed)
 Modified Barium Swallow Study  Patient Details  Name: Troy Mueller MRN: [id number] Date of Birth: 12/17/1932  Today's Date: 03/27/2023  Modified Barium Swallow completed.  Full report located under Chart Review in the Imaging Section.  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 He was kept NPO after failing Yale swallow with RN. 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 He was seen by SLP at [facility] during an admission in late October of 2024 with evaluation and treatment for dysphagia as well as speech/voice.  He is actively on hospice. Bedside swallow evaluation completed 03/26/23 and recommended full liquids/pudding thick and to proceed with MBS. Patient declined to have MBS on 1/11 when SLP evaluating but RN secure messaged SLP in early evening and patient had then agreed to do MBS.   Clinical Impression Patient presents with a mildly impaired oral phase of swallow and a moderately impaired pharyngeal phase of swallow. anterior hyoid excursion and laryngeal elevation were partial in completion. Laryngeal vestibule closure was incomplete as well as delayed/mistimed, leading to gross aspiration of nectar thick and honey thick liquids via spoon sips. Swallow was initiated at level of the vallecular sinus with puree/pudding consistency and at level of the pyriform sinus with nectar and honey thick liquids. Aspiration was silent (PAS 8) and occured prior to and during the swallow, with nectar and honey thick liquid boluses transiting directly into open airway. No instances of aspiration observed with puree solids x8 trials and only one instance of trace, flash penetration above the vocal cords (PAS 2) that fully exited  laryngeal vestibule. Only trace vallecular and aryepiglottic fold residuals observed across all consistencies. No retrograde movement of barium and all consistencies transited through PES without difficulty or delay. Patient did tolerate 1/2 spoon sips of honey thick liquids without aspiration (one instance of penetration). SLP is recommending PO diet of Dys 1 (puree) solids and pudding thick liquids. 1/2 spoon sips of honey thick liquids can be given by SLP only during skilled treatment sessions. Factors that may increase risk of adverse event in presence of aspiration Noe & Lianne 2021): Poor general health and/or compromised immunity;Weak cough;Limited mobility;Frequent aspiration of large volumes;Dependence for feeding and/or oral hygiene;Frail or deconditioned  Swallow Evaluation Recommendations Recommendations: PO diet PO Diet Recommendation: Dysphagia 1 (Pureed);Extremely thick liquids (Level 4, pudding thick) Liquid Administration via: Spoon Medication Administration: Crushed with puree Supervision: Full supervision/cueing for swallowing strategies;Full assist for feeding Swallowing strategies  : Slow rate;Small bites/sips Postural changes: Position pt fully upright for meals;Stay upright 30-60 min after meals Oral care recommendations: Oral care BID (2x/day);Staff/trained caregiver to provide oral care Caregiver Recommendations: Have oral suction available;Avoid jello, ice cream, thin soups, popsicles    Norleen IVAR Blase, MA, CCC-SLP Speech Therapy

## 2023-03-27 NOTE — Progress Notes (Addendum)
 [facility] Hospitalized Hospice Patient  [name]. 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  Patient was admitted to [facility] on 1.11.25 with diagnosis of Encepholophy.  Per Dr. Odella Pepper, with [facility].  This is a related hospital admission.   Patient moved to room 3W01.   Dr. Perri is awake & alert and oriented today.  He tries to speak but very hard to understand what he is trying to say.  No family at bedside.  Called and left a message with daughter, [name]/HCPOA.    Patient is appropriate for GIP to monitor new side effect of stroke requiring close monitoring by skilled personnel with frequent skilled assessment following left pons infarct.   Vital Signs:  T 98- oral, BP 141/78, P 60, R 14, SpO2 96%   Abnormal Labs:  HGB 12.6, HCT 38.4, glucose 169, BUN 26, Creat 1.78, A1C 8.1   IV Medications: None  New Diagnostics:   EXAM: MRI HEAD WITHOUT CONTRAST   TECHNIQUE: Multiplanar, multiecho pulse sequences of the brain and surrounding structures were obtained without intravenous contrast.   COMPARISON:  No prior MRI available, correlation is made with 03/26/2023 CT head and 01/01/2023 CTA head neck   FINDINGS: Brain: Restricted diffusion with ADC correlate in the left pons (series 2, images 15-19), which is not yet associated with increased T2 hyperintense signal.   No acute hemorrhage, mass, mass effect, or midline shift. No hydrocephalus. 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 Partial empty sella. Normal craniocervical junction.   Vascular: Normal arterial flow voids.   Skull and upper cervical spine: Normal marrow signal.   Sinuses/Orbits: Clear paranasal sinuses. Prominent fluid in the extra-axial spaces around the optic nerves, which could represent mild atrophy of the optic  nerves, which are somewhat tortuous. Dysconjugate gaze.   Other: The mastoid air cells are well aerated.   IMPRESSION: 1. Acute infarct in the left pons. 2. 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   These results will be called to the ordering clinician or representative by the Radiologist Assistant, and communication documented in the PACS or [employer]. Electronically Signed  By: Donald Campion M.D.   On: 03/26/2023 12:11  Hospital Problems: per progress note Damien Lease DO 1.12.25 MRI: Acute left pons infarct   #Acute encephalopathy #Acute Left [place] Stroke  Patient's encephalopathy has improved. He still has focal motor neurological deficits. His cranial nerve deficits have greatly improved. He is open to meeting with the palliative care team to discuss GOC.  Neurology is consulted and following, will continue to allow for permissive hypertension for 24 to 48 hours. Plan: -Continue telemetry  -Neurology consulted, appreciate their recommendations -PT/OT -SLP: MBS ordered for today -Pudding thick liquid diet -Aspiration precautions  -Palliative care consulted     #AKI Patient presented with a serum creatinine of 2.02.  After IV fluids, his creatinine did improve to 1.78.  Will administer additional IV fluids today, I suspect that his AKI is due to dehydration. -LR 125cc/hour for 10 hours    # Permanent atrial fibrillation Patient is not on rate control home, he does take eliquis  for AC.  -Restart Eliquis , 2.5 mg BID   # Severe obstructive sleep apnea Patient has severe OSA and uses home BiPAP at night. -Will order BiPAP nightly here   # Type 2 diabetes mellitus Appears that  patient is taking glipizide  5 mg daily.  Will hold off on restarting home regimen.  Will monitor with morning CBGs and begin SSI's as necessary.   # Hospice Patient is on hospice for senile degeneration of the  brain, he is full code, he is on a home regimen of seroquel  25 mg nightly.  -Will hold off on oral medications    # History of DVT and PE Has an IVC filter, on eliquis  2.5mg  BID.    #Hypothyroidism  On Synthroid  25 mcg, will continue home Synthroid   Discharge Plan:  back home with hospice- but date of discharge tbd. IDT:  updated Family Contact:  Left voice message for daughter/HCPOA [name] Goals of Care:  Clear, patient is a full code per his request.  Please do not hesitate to call with any hospice related questions or concerns.  Saddie HILARIO Na, RN Nurse Liaison [phone]

## 2023-03-27 NOTE — Progress Notes (Addendum)
 Subjective: Troy Mueller is a 89 y.o. 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   Today, he is laying in bed with his care givers in the room. He is able to communicate more clearly.  His caregivers were able to tell us  about his past occupation.  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   Objective:  Vital signs in last 24 hours: Vitals:   03/27/23 0200 03/27/23 0235 03/27/23 0400 03/27/23 0713  BP: (!) 141/78 (!) 159/64 (!) 158/76 (!) 160/77  Pulse: 60 74 63 63  Resp: 14   19  Temp:  98 F (36.7 C) 98.2 F (36.8 C) 98.2 F (36.8 C)  TempSrc:  Oral Oral Oral  SpO2: 96% 98% 96% 95%   Physical Exam: General: No acute distress, laying in bed Cardiac: Irregularly irregular rhythm, regular rate, no murmurs appreciated Pulmonary: Normal effort on room air Neuro: -Bilateral pupils are pinpoint and reactive to light -Extraocular eye movements have greatly improved today, patient does not appear to have extraocular eye movement deficits today -Patient does have a right sided facial droop, patient is able to close bilateral eyes -Right shoulder shrug decrease still present, left shoulder shrug 5 out of 5 -Right upper extremity is 1 out of 5 proximal muscle strength, distal right upper extremity has 0 out of 5, 0 out of 5 right upper extremity grip strength, left upper extremity is 5 out of 5 -Right lower extremity is 2 out of 5 muscle strength, left lower extremity is intact -Dysarthria is present but has greatly improved -Sensation to face is intact, bilateral upper extremity sensation is symmetrical, right lower extremity  sensation is decreased compared to the left Skin: Warm and dry Psych: Unable to fully assess, patient does not appear anxious     Latest Ref Rng & Units 03/27/2023    6:29 AM 03/26/2023    8:50 AM 03/26/2023    6:50 AM  CBC  WBC 4.0 - 10.5 K/uL 5.6   4.4   Hemoglobin 13.0 - 17.0 g/dL 87.3  88.3  88.3    88.3   Hematocrit 39.0 - 52.0 % 38.4  34.0  36.5    34.0   Platelets 150 - 400 K/uL 175   167        Latest Ref Rng & Units 03/27/2023    6:29 AM 03/26/2023    8:50 AM 03/26/2023    6:50 AM  BMP  Glucose 70 - 99 mg/dL 830   761    764   BUN 8 - 23 mg/dL 26   35    39   Creatinine 0.61 - 1.24 mg/dL 8.21   7.97    7.89   Sodium 135 - 145 mmol/L 137  135  137    136   Potassium 3.5 - 5.1 mmol/L 4.9  5.0  5.1    5.1   Chloride 98 - 111 mmol/L 103   103    105   CO2 22 - 32 mmol/L 25   23   Calcium  8.9 - 10.3 mg/dL 9.7   9.5    OIO:40 J8r: 8.1  MRI: Acute left  pons infarct Echo: LVEF 55-60% Assessment/Plan:  Principal Problem:   Acute cerebral infarction [facility]) Active Problems:   Atrial fibrillation (HCC)   OSA (obstructive sleep apnea)   Type 2 diabetes mellitus (HCC)   AKI (acute kidney injury) (HCC)  Troy Mueller is a 89 y.o. 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    #Acute encephalopathy #Acute Left Pontine Stroke  Patient's encephalopathy has improved. He still has focal motor neurological deficits. His cranial nerve deficits have greatly improved. He is open to meeting with the palliative care team to discuss GOC.  Neurology is consulted and following, will continue to allow for permissive hypertension for 24 to 48 hours. Plan: -Continue telemetry  -Neurology  consulted, appreciate their recommendations -PT/OT -SLP: MBS ordered for today -Pudding thick liquid diet -Aspiration precautions  -Palliative care consulted    #AKI Patient presented with a serum creatinine of 2.02.  After IV fluids, his creatinine did improve to 1.78.  Will administer additional IV fluids today, I suspect that his AKI is due to dehydration. -LR 125cc/hour for 10 hours    # Permanent atrial fibrillation Patient is not on rate control home, he does take eliquis  for AC.  -Restart Eliquis , 2.5 mg BID   # Severe obstructive sleep apnea Patient has severe OSA and uses home BiPAP at night. -Will order BiPAP nightly here   # Type 2 diabetes mellitus Appears that patient is taking glipizide  5 mg daily.  Will hold off on restarting home regimen.  Will monitor with morning CBGs and begin SSI's as necessary.   # Hospice Patient is on hospice for senile degeneration of the brain, he is full code, he is on a home regimen of seroquel  25 mg nightly.  -Will hold off on oral medications    # History of DVT and PE Has an IVC filter, on eliquis  2.5mg  BID.    #Hypothyroidism  On Synthroid  25 mcg, will continue home Synthroid    Resolved Problems:  __________________________________  Code Status: Full code  VTE Prophylaxis:NOAC Diet:Pudding thick  IVF: LR 125cc/hour  Barriers to Discharge: Dispo: Anticipated discharge in approximately 2-3 day(s).   Kandis Perkins, DO 03/27/2023, 11:28 AM Pager: [phone] After 5pm on weekdays and 1pm on weekends: On Call pager [phone]

## 2023-03-27 NOTE — Plan of Care (Signed)
 Refused to take medicines in the morning, wants to do it after the Swallow Procedure. Calm and no complaints throughout the day.  Problem: Clinical Measurements: Goal: Will remain free from infection Outcome: Progressing   Problem: Activity: Goal: Risk for activity intolerance will decrease Outcome: Progressing   Problem: Pain Management: Goal: General experience of comfort will improve Outcome: Progressing   Problem: Safety: Goal: Ability to remain free from injury will improve Outcome: Progressing   Problem: Skin Integrity: Goal: Risk for impaired skin integrity will decrease Outcome: Progressing

## 2023-03-27 NOTE — Plan of Care (Signed)
   Problem: Education: Goal: Knowledge of General Education information will improve Description: Including pain rating scale, medication(s)/side effects and non-pharmacologic comfort measures Outcome: Progressing   Problem: Safety: Goal: Ability to remain free from injury will improve Outcome: Progressing

## 2023-03-27 NOTE — Evaluation (Addendum)
 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   Clinical Impression  Patient evaluated by Physical Therapy with no further acute PT needs identified. PTA, pt lives at home with 24/7 caregiver assist, pt gets out of bed 1x/day to [employer] via hoyer lift, requires assist for bed-level ADLs. Today, pt requiring max-totalA +2 for bed mobility and repositioning; pt with decreased communication and speech difficult to understand, though able to make needs known; caregivers report speech normally clear. Pt with apparent RLE weakness though formal testing limited by pt with decreased desire to participate. Pt had two caregivers present and supportive, demonstrate good awareness of bed-level AROM/therex and pressure relief. All education has been completed and the patient has no further questions; pt not interested in working with acute PT services. Caregivers report access to PT at home who pt works with occasionally. Acute PT is signing off. Thank you for this referral.      If plan is discharge home, recommend the following: Two people to help with walking and/or transfers;Two people to help with bathing/dressing/bathroom;Assistance with cooking/housework;Assistance with feeding;Direct supervision/assist for medications management;Direct supervision/assist for financial management;Assist for transportation;Help with stairs or ramp for entrance   Can travel by private vehicle    No    Equipment Recommendations None recommended by PT  Recommendations for Other Services       Functional Status Assessment  Patient has had a recent decline in their functional status and demonstrates the ability to make significant improvements in function in a reasonable and predictable amount of time.     Precautions / Restrictions Precautions Precautions: Fall Restrictions Weight Bearing Restrictions Per Provider Order: No      Mobility  Bed Mobility Overal bed mobility: Needs Assistance             General bed mobility comments: totalA+2 to scoot up and reposition in bed    Transfers                   General transfer comment: deferred - pt asking therapists to leave; pt requires assist +2 and hoyer lift baseline    Ambulation/Gait                  Stairs            Wheelchair Mobility     Tilt Bed    Modified Rankin (Stroke Patients Only)       Balance                                             Pertinent Vitals/Pain Pain Assessment Pain Assessment: Faces Faces Pain Scale: No hurt Pain Intervention(s): Monitored during session    Home Living Family/patient expects to be discharged to:: Private residence Living Arrangements: Alone Available Help at Discharge: Personal care attendant;Available 24 hours/day Type of Home: House Home Access: Ramped entrance       Home Layout: One level Home Equipment: Wheelchair - manual;Hospital bed;Other (comment) (hoyer lift) Additional Comments: 2 caregivers present to assist  with history and setup; they report 16 caregivers in all to provide 24/7 support (1 at night and 2 during the day). pt also with supportive children nearby who are very involved with care    Prior Function Prior Level of Function : Needs assist             Mobility Comments: dependent mobility at baseline; caregivers lift via hoyer to [employer] 1x/day but otherwise stays in bed. has access to a PT at home if needed when he's agreeable ADLs Comments: bed-level ADLs with assist, typically self feeds with R hand      Extremity/Trunk Assessment   Upper Extremity Assessment Upper Extremity Assessment: RUE deficits/detail RUE Deficits / Details: 2- shoulder flexion, elbow flexion/extension; 0/5 hand flexion. no pain. RUE Sensation: WNL RUE Coordination: decreased fine motor;decreased gross motor    Lower Extremity Assessment Lower Extremity Assessment: RLE deficits/detail;LLE deficits/detail;Difficult to assess due to impaired cognition RLE Deficits / Details: minimal active movement noted RLE, though caregivers note pt flex/ext multiple times this AM; resting ankle PF but able to flex passively LLE Deficits / Details: h/o L foot drop per caregivers       Communication   Communication Communication: Difficulty following commands/understanding;Difficulty communicating thoughts/reduced clarity of speech Following commands: Follows one step commands with increased time;Follows multi-step commands inconsistently Cueing Techniques: Verbal cues;Tactile cues;Visual cues  Cognition Arousal: Alert Behavior During Therapy: Flat affect Overall Cognitive Status: History of cognitive impairments - at baseline                                 General Comments: pt with hx of senile degeration of the brain but he is alert and following most simple commands with increased time.  Poor attention and requires redirection.  Anticipate some is based on want to participate, as voices that's enough bye several times during session. caregivers report communication is normally clear        General Comments General comments (skin integrity, edema, etc.): two caregivers present at bedside - reviewed educ on pressure relief, RUE/RLE AAROM and PROM as pt agreeable; they report no DME needs or further questions    Exercises     Assessment/Plan    PT Assessment Patient does not need any further PT services  PT Problem List         PT Treatment Interventions      PT Goals (Current goals can be  found in the Care Plan section)  Acute Rehab PT Goals PT Goal Formulation: All assessment and education complete, DC therapy    Frequency       Co-evaluation PT/OT/SLP Co-Evaluation/Treatment: Yes Reason for Co-Treatment: For patient/therapist safety;To address functional/ADL transfers;Necessary to address cognition/behavior during functional activity PT goals addressed during session: Mobility/safety with mobility OT goals addressed during session: ADL's and self-care       AM-PAC PT 6 Clicks Mobility  Outcome Measure Help needed turning from your back to your side while in a flat bed without using bedrails?: Total Help needed moving from lying on your back to sitting on the side of a flat bed without using bedrails?: Total Help needed moving to and from a bed to a chair (including a wheelchair)?: Total Help needed standing up from a chair using your arms (e.g., wheelchair or bedside chair)?: Total Help needed to walk in hospital room?: Total Help needed climbing 3-5 steps with a railing? : Total 6 Click Score: 6  End of Session   Activity Tolerance: Patient tolerated treatment well Patient left: in bed;with call bell/phone within reach;with family/visitor present Nurse Communication: Mobility status PT Visit Diagnosis: Other abnormalities of gait and mobility (R26.89);Other symptoms and signs involving the nervous system (R29.898)    Time: 8654-8592 PT Time Calculation (min) (ACUTE ONLY): 22 min   Charges:   PT Evaluation $PT Eval Moderate Complexity: 1 Mod   PT General Charges $$ ACUTE PT VISIT: 1 Visit       Darice Almas, PT, DPT Acute Rehabilitation Services  Personal: [employer] Rehab Office: [phone]  Darice LITTIE Almas 03/27/2023, 3:38 PM

## 2023-03-27 NOTE — Progress Notes (Signed)
 Respiratory came to see the patient to put the BIPAP on but the patient refused to take it.

## 2023-03-27 NOTE — Progress Notes (Addendum)
 STROKE TEAM PROGRESS NOTE   BRIEF HPI [name] is a 89 y.o. 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  NIH on Admission 13  INTERIM HISTORY/SUBJECTIVE We will add ASA 81mg  for 3 months. Has full time caregivers, at baseline they use a hoyer lift for transfers and he is able to ride a stationary bike.They state he is able to use his arms and is cognitively intact at baseline.  Overnight home health nurse noted right sided weakness and slurred speech. Still with right upper and lower extremity weakness and dysarthria. Hemodynamically stable.  MRI scan shows a left paramedian pontine infarct.  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 OBJECTIVE  CBC    Component Value Date/Time   WBC 5.6 03/27/2023 0629   RBC 4.03 (L) 03/27/2023 0629   HGB 12.6 (L) 03/27/2023 0629   HCT 38.4 (L) 03/27/2023 0629   PLT 175 03/27/2023 0629   MCV 95.3 03/27/2023 0629   MCH 31.3 03/27/2023 0629   MCHC 32.8 03/27/2023 0629   RDW 13.8 03/27/2023 0629   LYMPHSABS 1.2 03/26/2023 0650   MONOABS 0.5 03/26/2023 0650   EOSABS 0.2 03/26/2023 0650   BASOSABS 0.0 03/26/2023 0650    BMET    Component Value Date/Time   NA 137 03/27/2023 0629   K 4.9 03/27/2023 0629   CL 103 03/27/2023 0629   CO2 25 03/27/2023 0629   GLUCOSE 169 (H) 03/27/2023 0629   BUN 26 (H) 03/27/2023 0629   CREATININE 1.78 (H) 03/27/2023 0629   CALCIUM  9.7 03/27/2023 0629   GFRNONAA 36 (L) 03/27/2023 0629    IMAGING past 24 hours ECHOCARDIOGRAM COMPLETE BUBBLE STUDY Result Date: 03/26/2023    ECHOCARDIOGRAM REPORT   Patient Name:   Troy Mueller Date of Exam: 03/26/2023  Medical Rec #:  [id number]           Height:       71.0 in Accession #:    [id number]          Weight:       282.2 lb Date of Birth:  05-29-1932            BSA:          2.441 m Patient Age:    89 years            BP:           149/76 mmHg Patient Gender: M                   HR:           63 bpm. Exam Location:  Inpatient Procedure: 2D Echo, Cardiac Doppler, Color Doppler and Saline Contrast Bubble            Study Indications:    Stroke.  History:        Patient has no prior history of Echocardiogram examinations.                 Abnormal ECG, Arrythmias:Atrial Fibrillation,                 Signs/Symptoms:Dyspnea and Shortness of Breath; Risk  Factors:Hypertension, Dyslipidemia and Sleep Apnea.  Sonographer:    Ellouise Mose RDCS Referring Phys: [id number] GRACE LAU  Sonographer Comments: Technically difficult study due to poor echo windows. Patient supine. IMPRESSIONS  1. Left ventricular ejection fraction, by estimation, is 55 to 60%. The left ventricle has normal function. The left ventricle has no regional wall motion abnormalities. Left ventricular diastolic parameters are indeterminate.  2. Right ventricular systolic function is low normal. The right ventricular size is moderately enlarged. Mildly increased right ventricular wall thickness.  3. Left atrial size was severely dilated.  4. Right atrial size was moderately dilated.  5. The mitral valve is abnormal. Trivial mitral valve regurgitation. Moderate mitral stenosis. Severe mitral annular calcification.  6. The aortic valve is abnormal. Aortic valve regurgitation is not visualized. Aortic valve sclerosis/calcification is present, without any evidence of aortic stenosis.  7. There is borderline dilatation of the aortic root, measuring 39 mm. There is borderline dilatation of the ascending aorta, measuring 39 mm.  8. Agitated saline contrast bubble study was negative, with no evidence of any interatrial shunt. FINDINGS  Left Ventricle: Left  ventricular ejection fraction, by estimation, is 55 to 60%. The left ventricle has normal function. The left ventricle has no regional wall motion abnormalities. The left ventricular internal cavity size was normal in size. There is  no left ventricular hypertrophy. Left ventricular diastolic parameters are indeterminate. Right Ventricle: The right ventricular size is moderately enlarged. Mildly increased right ventricular wall thickness. Right ventricular systolic function is low normal. Left Atrium: Left atrial size was severely dilated. Right Atrium: Right atrial size was moderately dilated. Pericardium: There is no evidence of pericardial effusion. Mitral Valve: The mitral valve is abnormal. Severe mitral annular calcification. Trivial mitral valve regurgitation. Moderate mitral valve stenosis. MV peak gradient, 10.2 mmHg. The mean mitral valve gradient is 4.0 mmHg. Tricuspid Valve: The tricuspid valve is normal in structure. Tricuspid valve regurgitation is not demonstrated. No evidence of tricuspid stenosis. Aortic Valve: The aortic valve is abnormal. Aortic valve regurgitation is not visualized. Aortic valve sclerosis/calcification is present, without any evidence of aortic stenosis. Aortic valve mean gradient measures 5.0 mmHg. Aortic valve peak gradient measures 8.7 mmHg. Aortic valve area, by VTI measures 3.94 cm. Pulmonic Valve: The pulmonic valve was normal in structure. Pulmonic valve regurgitation is not visualized. No evidence of pulmonic stenosis. Aorta: The aortic root and ascending aorta are structurally normal, with no evidence of dilitation. There is borderline dilatation of the aortic root, measuring 39 mm. There is borderline dilatation of the ascending aorta, measuring 39 mm. IAS/Shunts: No atrial level shunt detected by color flow Doppler. Agitated saline contrast was given intravenously to evaluate for intracardiac shunting. Agitated saline contrast bubble study was negative, with no  evidence of any interatrial shunt.  LEFT VENTRICLE PLAX 2D LVIDd:         4.80 cm LVIDs:         3.50 cm LV PW:         1.50 cm LV IVS:        1.60 cm LVOT diam:     2.50 cm LV SV:         112 LV SV Index:   46 LVOT Area:     4.91 cm  LV Volumes (MOD) LV vol d, MOD A2C: 70.6 ml LV vol d, MOD A4C: 130.0 ml LV vol s, MOD A2C: 25.5 ml LV vol s, MOD A4C: 58.4 ml LV SV MOD A2C:     45.1 ml  LV SV MOD A4C:     130.0 ml LV SV MOD BP:      60.1 ml RIGHT VENTRICLE RV S prime:     11.60 cm/s TAPSE (M-mode): 1.6 cm LEFT ATRIUM           Index        RIGHT ATRIUM           Index LA diam:      4.90 cm 2.01 cm/m   RA Area:     18.00 cm LA Vol (A2C): 59.5 ml 24.38 ml/m  RA Volume:   50.20 ml  20.57 ml/m LA Vol (A4C): 96.4 ml 39.50 ml/m  AORTIC VALVE AV Area (Vmax):    3.83 cm AV Area (Vmean):   3.76 cm AV Area (VTI):     3.94 cm AV Vmax:           147.50 cm/s AV Vmean:          99.050 cm/s AV VTI:            0.284 m AV Peak Grad:      8.7 mmHg AV Mean Grad:      5.0 mmHg LVOT Vmax:         115.00 cm/s LVOT Vmean:        75.800 cm/s LVOT VTI:          0.228 m LVOT/AV VTI ratio: 0.80  AORTA Ao Root diam: 3.90 cm Ao Asc diam:  3.90 cm MITRAL VALVE MV Area (PHT): 3.85 cm     SHUNTS MV Area VTI:   2.76 cm     Systemic VTI:  0.23 m MV Peak grad:  10.2 mmHg    Systemic Diam: 2.50 cm MV Mean grad:  4.0 mmHg MV Vmax:       1.60 m/s MV Vmean:      88.8 cm/s MV Decel Time: 197 msec MV E velocity: 149.00 cm/s Kardie Tobb DO Electronically signed by Dub Huntsman DO Signature Date/Time: 03/26/2023/4:06:58 PM    Final    MR BRAIN WO CONTRAST Result Date: 03/26/2023 CLINICAL DATA:  Stroke suspected EXAM: MRI HEAD WITHOUT CONTRAST TECHNIQUE: Multiplanar, multiecho pulse sequences of the brain and surrounding structures were obtained without intravenous contrast. COMPARISON:  No prior MRI available, correlation is made with 03/26/2023 CT head and 01/01/2023 CTA head neck FINDINGS: Brain: Restricted diffusion with ADC correlate in the  left pons (series 2, images 15-19), which is not yet associated with increased T2 hyperintense signal. No acute hemorrhage, mass, mass effect, or midline shift. No hydrocephalus. 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 Partial empty sella. Normal craniocervical junction. Vascular: Normal arterial flow voids. Skull and upper cervical spine: Normal marrow signal. Sinuses/Orbits: Clear paranasal sinuses. Prominent fluid in the extra-axial spaces around the optic nerves, which could represent mild atrophy of the optic nerves, which are somewhat tortuous. Dysconjugate gaze. Other: The mastoid air cells are well aerated. IMPRESSION: 1. Acute infarct in the left pons. 2. 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 These results will be called to the ordering clinician or representative by the Radiologist Assistant, and communication documented in the PACS or [employer]. Electronically Signed   By: Donald Campion M.D.   On: 03/26/2023 12:11    Vitals:   03/27/23 0200 03/27/23 0235 03/27/23 0400 03/27/23 0713  BP: (!) 141/78 (!) 159/64 (!) 158/76 (!) 160/77  Pulse: 60 74 63 63  Resp: 14   19  Temp:  98 F (36.7 C) 98.2 F (36.8 C) 98.2 F (36.8 C)  TempSrc:  Oral Oral Oral  SpO2: 96% 98% 96% 95%     PHYSICAL EXAM General:  Alert, well-nourished, well-developed elderly Caucasian male in no acute distress Psych:  Mood and affect appropriate for situation CV: Regular rate and rhythm on monitor Respiratory:  Regular, unlabored respirations on room air GI: Abdomen soft and nontender   NEURO:  Mental Status: AA&Ox3,   Speech/Language: speech is severely dysarthric but can be understood with some difficulty.  .  Cranial Nerves:  II: PERRL. Visual fields full.  Mueller, IV, VI: EOMI. Eyelids elevate symmetrically Left 6th nerve palsy V: Sensation is  intact to light touch and symmetrical to face.  VII: Face is asymmetric right VIII: hearing intact to voice. IX, X: Palate elevates symmetrically. Phonation is normal.  KP:Dynloizm shrug 5/5. XII: tongue is deviated to the right Motor:  RUE 1/5 RLE  1/5 LUE 5/5 LLE 2/5 Tone: is normal and bulk is normal Sensation- Intact to light touch bilaterally. Extinction absent to light touch to DSS.   Coordination: FTN intact on the left Gait- deferred  ASSESSMENT/PLAN  Acute Ischemic Infarct:  acute infarct in the left pons  Etiology: small vessel disease in the setting of terminal right dominant vertebral artery stenosis and history of atrial fibrillation on anticoagulation with Eliquis  Code Stroke CT head No acute abnormality. ASPECTS 10.    CTA head & neck - Widespread intracranial and extracranial atherosclerosis, including areas of hemodynamically significant stenosis (perhaps most notably Severe Stenosis of the dominant Right Vertebral V4 segment which is the sole supply to the Basilar). MRI  acute infarct left pons 2D Echo 55-60%, severely dilated LA  LDL 59 HgbA1c 8.1 VTE prophylaxis - Eliquis  Eliquis  (apixaban ) daily prior to admission, now on aspirin  81 mg daily and Eliquis  (apixaban ) daily for 3 months and then Eliquis  alone. Consider adding ASA 81mg  for 3 months given severe stenosis  Therapy recommendations:  Pending Disposition:  pending  Atrial fibrillation Home Meds: Eliquis  Continue telemetry monitoring  Hypertension Home meds:  norvasc  Stable Blood Pressure Goal: BP less than 220/110   Hyperlipidemia Home meds:  Crestor , resumed in hospital LDL 59, goal < 70 Continue statin at discharge  Diabetes type II Uncontrolled Home meds:  glipizide  HgbA1c 8.1, goal < 7.0 CBGs SSI Recommend close follow-up with PCP for better DM control  Dysphagia Patient has post-stroke dysphagia, SLP consulted    Diet   Diet full liquid Room service appropriate? Yes; Fluid  consistency: Pudding Thick  Caregivers report aspiration issues prior to admission Advance diet as tolerated  Other Stroke Risk Factors Obesity, There is no height or weight on file to calculate BMI., BMI >/= 30 associated with increased stroke risk, recommend weight loss, diet and exercise as appropriate   Other Active Problems AKI Patient presented with a serum creatinine of 2.02.  After IV fluids, his creatinine did improve to 1.78.  Will administer additional IV fluids today, I suspect that his AKI is due to dehydration. LR 125cc/hour for 10 hours  Severe obstructive sleep apnea Patient has severe OSA and uses home BiPAP at night. 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  Will hold off on oral medications  Hypothyroidism  On Synthroid  25 mcg, will continue home Synthroid   Hospital day # 1   Patient seen and examined by NP/APP with MD. MD to update note as needed.   Jorene Last, DNP, FNP-BC [facility] Pager: [phone]  STROKE MD NOTE :  I have personally obtained history,examined this patient, reviewed notes, independently viewed imaging studies, participated in medical decision making and plan of care.ROS completed by me personally and pertinent positives fully documented  I have made any additions or clarifications directly to the above note. Agree with note above.  Patient with chronic atrial fibrillation on Eliquis  presented with sudden onset of dysarthria and right hemiparesis due to left paramedian pontine infarct.  Multiple etiologies possible atrial fibrillation despite anticoagulation versus terminal right vertebral artery symptomatic stenosis as well as small vessel disease.  Recommend adding aspirin  81 mg daily to Eliquis  for 3 months and intracranial stenosis and due to increased bleeding risk we will not do triple therapy.   Speech therapy for swallow eval.  Continue physical occupational and speech therapy.  Aggressive risk factor modification.  Long discussion with patient and with his daughter at the bedside and answered questions.  Discussed with caregivers.  Greater than 50% time during this 50-minute visit was spent in counseling and coordination of care about his pontine stroke and discussion binetrakin stenosis as well as atrial fibrillation and answering questions.  Eather Popp, MD Medical Director [facility] Pager: [phone] 03/27/2023 1:35 PM   To contact Stroke Continuity provider, please refer to [email or web address]. After hours, contact General Neurology

## 2023-03-27 NOTE — ED Notes (Signed)
 Floor notified that pt is being transported

## 2023-03-27 NOTE — Evaluation (Addendum)
 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 He is actively on hospice. 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  He is actively on hospice.   Clinical Impression   Pt admitted for above and presents with problem list below.  At baseline, he has 24/7 caregiver (2 during day and 1 overnight), needing total care for ADLs from bed level (basin baths, bed pan) but was able to feed himself.  Caregivers assist via hoyer to lift pt to exercise bike 1x /day but otherwise bed bound.  He does present with weakness on R side, decreased attention and questionable visual deficits/visual attention.  +2 total assist for bed mobility today, ADLs are baseline except for self feeding (but not eating at this point). Pt not interested in engaging in much therapy today, voicing that's enough and bye several times.  Educated caregivers on importance of support to R UE, keeping UE placed on pillow; caregivers report providing ROM and rolling schedule in bed as tolerated. He does report wanting to work on R UE movement at home, therefore recommend HHOT services at dc.  Acutely, pt not interested in OT services and OT will sign off.  Thank you for this referral.        If plan is discharge home, recommend the following: Two people to help with walking and/or transfers;Two people to help with bathing/dressing/bathroom;Assistance with feeding;Direct supervision/assist for medications management;Direct supervision/assist for financial management;Supervision due to cognitive status;Assist for transportation    Functional  Status Assessment  Patient has had a recent decline in their functional status and demonstrates the ability to make significant improvements in function in a reasonable and predictable amount of time.  Equipment Recommendations  None recommended by OT    Recommendations for Other Services       Precautions / Restrictions Precautions Precautions: Fall Restrictions Weight Bearing Restrictions Per Provider Order: No      Mobility Bed Mobility Overal bed mobility: Needs Assistance             General bed mobility comments: +2 total assist to reposition in bed using trendelenburg function.    Transfers                   General transfer comment: deferred      Balance                                           ADL either performed or assessed with clinical judgement   ADL Overall ADL's : Needs assistance/impaired   Eating/Feeding Details (indicate cue type and reason): pt on thickened liquids, but caregivers report pt is not drinking. pt could use L hand to assist if needed. Grooming: Maximal assistance;Bed level           Upper Body Dressing : Total assistance;Bed level   Lower Body Dressing: +2 for physical assistance;Total assistance;Bed level               Functional mobility during ADLs: Total assistance;+2 for physical assistance General  ADL Comments: bed level ADls at baseline, baseline for bathing/dressing but decreased for grooming/eating     Vision   Additional Comments: caregivers report pt wears glasses at times but inconsistent; he tends to overshoot when looking to the L and preference to speak to therapist on the R side.  Further assessment would be beneficial     Perception         Praxis         Pertinent Vitals/Pain Pain Assessment Pain Assessment: Faces Faces Pain Scale: No hurt     Extremity/Trunk Assessment Upper Extremity Assessment Upper Extremity Assessment: Right hand dominant;RUE  deficits/detail RUE Deficits / Details: 2- shoulder flexion, elbow flexion/extension; 0/5 hand flexion.  no pain. RUE Sensation: WNL RUE Coordination: decreased fine motor;decreased gross motor   Lower Extremity Assessment Lower Extremity Assessment: Defer to PT evaluation       Communication Communication Communication: Difficulty following commands/understanding;Difficulty communicating thoughts/reduced clarity of speech Following commands: Follows one step commands with increased time;Follows multi-step commands inconsistently Cueing Techniques: Verbal cues;Tactile cues;Visual cues   Cognition Arousal: Alert Behavior During Therapy: Flat affect Overall Cognitive Status: History of cognitive impairments - at baseline                                 General Comments: pt with hx of senile degeration of the brain but he is alert and following most simple commands with increased time.  Poor attention and requires redirection.  Anticipate some is based on want to participate, as voices that's enough bye several times during session.     General Comments  caregivers at side    Exercises     Shoulder Instructions      Home Living Family/patient expects to be discharged to:: Private residence Living Arrangements: Alone Available Help at Discharge: Personal care attendant;Available 24 hours/day Type of Home: House Home Access: Ramped entrance     Home Layout: One level     Bathroom Shower/Tub: Sponge bathes at baseline   [employer]:  (bedpan at baseline)     Home Equipment: Wheelchair - manual;Hospital bed;Other (comment) (hoyer lift)   Additional Comments: 2 caregivers present to assist with history and setup; report 16 caregivers in all to provide 24/7 support (1 at night and 2 during the day)      Prior Functioning/Environment Prior Level of Function : Needs assist             Mobility Comments: dependent mobility at baseline; caregivers  lift via hoyer to [employer] 1x/day but otherwise stays in bed ADLs Comments: bed level ADls with assist, typically self feeds with R hand        OT Problem List: Decreased strength;Decreased range of motion;Decreased activity tolerance;Impaired vision/perception;Decreased coordination;Decreased cognition;Decreased safety awareness;Decreased knowledge of use of DME or AE;Decreased knowledge of precautions;Impaired UE functional use      OT Treatment/Interventions:      OT Goals(Current goals can be found in the care plan section) Acute Rehab OT Goals Patient Stated Goal: none stated  OT Frequency:      Co-evaluation PT/OT/SLP Co-Evaluation/Treatment: Yes Reason for Co-Treatment: For patient/therapist safety;To address functional/ADL transfers;Necessary to address cognition/behavior during functional activity   OT goals addressed during session: ADL's and self-care      AM-PAC OT 6 Clicks Daily Activity     Outcome Measure Help from another person eating meals?: Total Help from another person taking care of personal grooming?: A Lot  Help from another person toileting, which includes using toliet, bedpan, or urinal?: Total Help from another person bathing (including washing, rinsing, drying)?: Total Help from another person to put on and taking off regular upper body clothing?: Total Help from another person to put on and taking off regular lower body clothing?: Total 6 Click Score: 7   End of Session Nurse Communication: Mobility status  Activity Tolerance: Patient tolerated treatment well;Other (comment) (limited by patient preference to end session) Patient left: in bed;with call bell/phone within reach;with family/visitor present  OT Visit Diagnosis: Other abnormalities of gait and mobility (R26.89);Muscle weakness (generalized) (M62.81);Cognitive communication deficit (R41.841);Other symptoms and signs involving cognitive function Symptoms and signs involving cognitive  functions: Cerebral infarction                Time: 1345-1406 OT Time Calculation (min): 21 min Charges:  OT General Charges $OT Visit: 1 Visit OT Evaluation $OT Eval Moderate Complexity: 1 Mod  Etta NOVAK, OT Acute Rehabilitation Services Office [phone]   Etta GORMAN [place] 03/27/2023, 3:23 PM

## 2023-03-27 NOTE — Progress Notes (Signed)
   Palliative Medicine Inpatient Follow Up Note   The PMT has acknowledged the consultation for Troy Mueller.  I have communicated directly with [name] of [facility] as well as the family medicine team, Dr. Norrine.   At this time patients needs and goals are being address directly through [facility].   The PMT will remove the consult, though please do not hesitate to re-consult us  if additional needs arise.  No Charge ______________________________________________________________________________________ [name] [place] Palliative Medicine Team Team Cell Phone: [phone] Please utilize secure chat with additional questions, if there is no response within 30 minutes please call the above phone number  Palliative Medicine Team providers are available by phone from 7am to 7pm daily and can be reached through the team cell phone.  Should this patient require assistance outside of these hours, please call the patient's attending physician.

## 2023-03-27 NOTE — Progress Notes (Signed)
   03/27/23 0618  BiPAP/CPAP/SIPAP  Reason BIPAP/CPAP not in use Other(comment) (came uo to room this evening will bring home bipap tonight)

## 2023-03-28 ENCOUNTER — Other Ambulatory Visit: Payer: Self-pay

## 2023-03-28 DIAGNOSIS — Z7984 Long term (current) use of oral hypoglycemic drugs: Secondary | ICD-10-CM

## 2023-03-28 DIAGNOSIS — E119 Type 2 diabetes mellitus without complications: Secondary | ICD-10-CM

## 2023-03-28 DIAGNOSIS — I4891 Unspecified atrial fibrillation: Secondary | ICD-10-CM

## 2023-03-28 DIAGNOSIS — G311 Senile degeneration of brain, not elsewhere classified: Secondary | ICD-10-CM

## 2023-03-28 DIAGNOSIS — I639 Cerebral infarction, unspecified: Secondary | ICD-10-CM | POA: Diagnosis not present

## 2023-03-28 DIAGNOSIS — G4733 Obstructive sleep apnea (adult) (pediatric): Secondary | ICD-10-CM | POA: Diagnosis not present

## 2023-03-28 LAB — GLUCOSE, CAPILLARY
Glucose-Capillary: 209 mg/dL — ABNORMAL HIGH (ref 70–99)
Glucose-Capillary: 216 mg/dL — ABNORMAL HIGH (ref 70–99)
Glucose-Capillary: 207 mg/dL — ABNORMAL HIGH (ref 70–99)

## 2023-03-28 LAB — BASIC METABOLIC PANEL
Anion gap: 9 (ref 5–15)
BUN: 23 mg/dL (ref 8–23)
CO2: 23 mmol/L (ref 22–32)
Calcium: 9.6 mg/dL (ref 8.9–10.3)
Chloride: 104 mmol/L (ref 98–111)
Creatinine, Ser: 1.61 mg/dL — ABNORMAL HIGH (ref 0.61–1.24)
GFR, Estimated: 41 mL/min — ABNORMAL LOW (ref >60)
Glucose, Bld: 178 mg/dL — ABNORMAL HIGH (ref 70–99)
Potassium: 4.6 mmol/L (ref 3.5–5.1)
Sodium: 136 mmol/L (ref 135–145)

## 2023-03-28 LAB — CBC
HCT: 39.2 % (ref 39.0–52.0)
Hemoglobin: 12.8 g/dL — ABNORMAL LOW (ref 13.0–17.0)
MCH: 30.9 pg (ref 26.0–34.0)
MCHC: 32.7 g/dL (ref 30.0–36.0)
MCV: 94.7 fL (ref 80.0–100.0)
Platelets: 164 10*3/uL (ref 150–400)
RBC: 4.14 MIL/uL — ABNORMAL LOW (ref 4.22–5.81)
RDW: 13.7 % (ref 11.5–15.5)
WBC: 6.8 10*3/uL (ref 4.0–10.5)
nRBC: 0 % (ref 0.0–0.2)

## 2023-03-28 MED ORDER — GLIPIZIDE ER 5 MG PO TB24
5.0000 mg | ORAL_TABLET | Freq: Every day | ORAL | Status: DC
  Administered 2023-03-28 – 2023-03-31 (×4): 5 mg via ORAL
  Filled 2023-03-28 (×4): qty 1

## 2023-03-28 MED ORDER — GLIPIZIDE ER 5 MG PO TB24: 5.0000 mg | ORAL_TABLET | Freq: Every day | ORAL | Status: DC

## 2023-03-28 MED ORDER — ENSURE ENLIVE PO LIQD
237.0000 mL | Freq: Two times a day (BID) | ORAL | Status: DC
  Administered 2023-03-28 – 2023-03-30 (×4): 237 mL via ORAL

## 2023-03-28 MED ORDER — BISACODYL 10 MG RE SUPP
10.0000 mg | Freq: Once | RECTAL | Status: AC
  Administered 2023-03-28: 10 mg via RECTAL
  Filled 2023-03-28: qty 1

## 2023-03-28 NOTE — Plan of Care (Signed)
  Problem: Education: Goal: Knowledge of General Education information will improve Description: Including pain rating scale, medication(s)/side effects and non-pharmacologic comfort measures Outcome: Progressing   Problem: Health Behavior/Discharge Planning: Goal: Ability to manage health-related needs will improve Outcome: Progressing   Problem: Activity: Goal: Risk for activity intolerance will decrease Outcome: Progressing   Problem: Nutrition: Goal: Adequate nutrition will be maintained Outcome: Progressing   Problem: Coping: Goal: Level of anxiety will decrease Outcome: Progressing   Problem: Pain Management: Goal: General experience of comfort will improve Outcome: Progressing   Problem: Safety: Goal: Ability to remain free from injury will improve Outcome: Progressing   Problem: Skin Integrity: Goal: Risk for impaired skin integrity will decrease Outcome: Progressing   Problem: Ischemic Stroke/TIA Tissue Perfusion: Goal: Complications of ischemic stroke/TIA will be minimized Outcome: Progressing   Problem: Education: Goal: Knowledge of disease or condition will improve Outcome: Progressing Goal: Knowledge of secondary prevention will improve (MUST DOCUMENT ALL) Outcome: Progressing Goal: Knowledge of patient specific risk factors will improve Alonso N/A or DELETE if not current risk factor) Outcome: Progressing   Problem: Coping: Goal: Will verbalize positive feelings about self Outcome: Progressing   Problem: Coping: Goal: Will verbalize positive feelings about self Outcome: Progressing   Problem: Nutrition: Goal: Risk of aspiration will decrease Outcome: Progressing

## 2023-03-28 NOTE — Progress Notes (Signed)
 Subjective:  Laying in bed with caregivers and nurse. Family wonders if he could be a candidate for inpatient rehab as they feel he would not get intense rehab at home, and he would like to get some recovery on his hands and feet. TOC came in and re-sent in PT order as they had signed off.   Objective:  Vital signs in last 24 hours: Vitals:   03/28/23 0000 03/28/23 0016 03/28/23 0400 03/28/23 0800  BP: (!) 142/78  131/66 125/64  Pulse: 88  81 71  Resp:  (!) 23 18 18   Temp: 99.4 F (37.4 C)  98.8 F (37.1 C) 98.6 F (37 C)  TempSrc: Oral  Oral Oral  SpO2: 95%  94% 99%   Physical Exam: General: No acute distress, laying in bed Cardiac: Irregularly irregular rhythm, regular rate, no murmurs appreciated Pulmonary: Normal effort on room air, rales on the right lower lung Neuro: -No Nystagmus, EOMI -Right facial droop.  -Dysarthria is mild, tongue deviation mildly to the right, symmetric palatal elevation -Right upper extremity 2/5 Strength, Right lower extremity 1/5 strength, Left upper 5/5 and lower 4/5 -Tone and bulk normal Skin: Warm and dry     Latest Ref Rng & Units 03/28/2023    5:53 AM 03/27/2023    6:29 AM 03/26/2023    8:50 AM  CBC  WBC 4.0 - 10.5 K/uL 6.8  5.6    Hemoglobin 13.0 - 17.0 g/dL 87.1  87.3  88.3   Hematocrit 39.0 - 52.0 % 39.2  38.4  34.0   Platelets 150 - 400 K/uL 164  175         Latest Ref Rng & Units 03/28/2023    5:53 AM 03/27/2023    6:29 AM 03/26/2023    8:50 AM  BMP  Glucose 70 - 99 mg/dL 821  830    BUN 8 - 23 mg/dL 23  26    Creatinine 9.38 - 1.24 mg/dL 8.38  8.21    Sodium 864 - 145 mmol/L 136  137  135   Potassium 3.5 - 5.1 mmol/L 4.6  4.9  5.0   Chloride 98 - 111 mmol/L 104  103    CO2 22 - 32 mmol/L 23  25    Calcium  8.9 - 10.3 mg/dL 9.6  9.7      Assessment/Plan:  Principal Problem:   Acute cerebral infarction (HCC) Active Problems:   Atrial fibrillation (HCC)   OSA (obstructive sleep apnea)   Type 2 diabetes mellitus  (HCC)   AKI (acute kidney injury) (HCC)  Troy Mueller is a 89 y.o. 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    #Acute encephalopathy #Acute Left Paramedian Pontine Stroke  Patient would like to get more strength back before feeling he can safely go home. PT had recommended HH PT, but they have been re consulted for further recommendations. TOC placed order, PT eval pending. SLP eval yesterday recommended Dysphagia 1 pureed solid foods. SLP order placed for further language and speech assessment. Dysarthria seems to have improved. Residual motor deficits on the right still present. Subjectively the patient feels he is slowly improving. Would benefit from aggressive PT, but unlikely that he would go back completely to his baseline. Appreciate Neuro, PT/OT and Speech. Has some crackles on the lower right lung. Will add  on spirometry.  -PT re-eval pending  -Speech re-eval pending  -Dysphagia 1 diet  -ASA 81 mg daily for 3 months per neuro  -Cw Eliquis  2.5mg  BID   #CKD Stage IIIb #AKI - resolved Patient presented with a serum creatinine of 2.02.  After IV fluids, his creatinine did improve and it is back to baseline (~1.3-1.4) at 1.6 today. Patient able to have pureed foods.   # Permanent atrial fibrillation Patient is not on rate control home, he does take eliquis  for AC.  -cw Eliquis , 2.5 mg BID   # Severe obstructive sleep apnea Patient has severe OSA and uses home BiPAP at night. Pt refused last night. -cw BiPap   # Type 2 diabetes mellitus Appears that patient is taking glipizide  5 mg daily at home.  Hgb A1C 8.1. FS glucose is 209 today. Can restart home glipizide , monitor glucose TID.    #Senile Degeneration of the brain . # Hospice Patient is on hospice for senile degeneration of the brain, he is full code.  Palliative through [facility]. He is on a home regimen of seroquel  nightly.  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    #Hypothyroidism  On Synthroid  25 mcg, will continue home Synthroid   Code Status: Full code  VTE Prophylaxis:NOAC Diet:Dysphagia 1 Barriers to Discharge: Dispo: Anticipated discharge in approximately 2-3 day(s).   Troy Washington, MD 03/28/2023, 11:44 AM Pager: [phone] After 5pm on weekdays and 1pm on weekends: On Call pager [phone]

## 2023-03-28 NOTE — Progress Notes (Signed)
 STROKE TEAM PROGRESS NOTE   BRIEF HPI Troy Mueller is a 89 y.o. 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  NIH on Admission 13  INTERIM HISTORY/SUBJECTIVE We will add ASA 81mg  for 3 months. Has full time caregivers, at baseline they use a hoyer lift for transfers and he is able to ride a stationary bike.They state he is able to use his arms and is cognitively intact at baseline.  Overnight home health nurse noted right sided weakness and slurred speech. Still with right upper and lower extremity weakness and dysarthria. Hemodynamically stable.  MRI scan shows a left paramedian pontine infarct.  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 OBJECTIVE  CBC    Component Value Date/Time   WBC 6.8 03/28/2023 0553   RBC 4.14 (L) 03/28/2023 0553   HGB 12.8 (L) 03/28/2023 0553   HCT 39.2 03/28/2023 0553   PLT 164 03/28/2023 0553   MCV 94.7 03/28/2023 0553   MCH 30.9 03/28/2023 0553   MCHC 32.7 03/28/2023 0553   RDW 13.7 03/28/2023 0553   LYMPHSABS 1.2 03/26/2023 0650   MONOABS 0.5 03/26/2023 0650   EOSABS 0.2 03/26/2023 0650   BASOSABS 0.0 03/26/2023 0650    BMET    Component Value Date/Time   NA 136 03/28/2023 0553   K 4.6 03/28/2023 0553   CL 104 03/28/2023 0553   CO2 23 03/28/2023 0553   GLUCOSE 178 (H) 03/28/2023 0553   BUN 23 03/28/2023 0553   CREATININE 1.61 (H) 03/28/2023 0553   CALCIUM  9.6 03/28/2023 0553   GFRNONAA 41 (L) 03/28/2023 0553    IMAGING past 24 hours DG Swallowing Func-Speech Pathology Result Date: 03/27/2023 Table formatting from the original result was not included. 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 He was kept NPO after failing Yale swallow with RN. 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 He was seen by SLP at [facility] during an admission in late October of 2024 with evaluation and treatment for dysphagia as well as speech/voice.  He is actively on hospice. Bedside swallow evaluation completed 03/26/23 and recommended full liquids/pudding thick and to proceed with MBS. Patient declined to have MBS on 1/11 when SLP evaluating but RN secure messaged SLP in early evening and patient had then agreed to do MBS. Clinical Impression: Clinical Impression: Patient presents with a mildly impaired oral phase of swallow and a moderately impaired pharyngeal phase of swallow. anterior hyoid excursion and laryngeal elevation were partial in completion. Laryngeal vestibule closure was incomplete as well as delayed/mistimed, leading to gross aspiration of nectar thick and honey thick liquids via spoon sips. Swallow was initiated at level of the vallecular sinus with puree/pudding consistency and at level of the pyriform sinus with nectar and honey thick liquids. Aspiration was silent (PAS 8) and occured prior to and during the  swallow, with nectar and honey thick liquid boluses transiting directly into open airway. No instances of aspiration observed with puree solids x8 trials and only one instance of trace, flash penetration above the vocal cords (PAS 2) that fully exited laryngeal vestibule. Only trace vallecular and aryepiglottic fold residuals observed across all consistencies. No retrograde movement of barium and all  consistencies transited through PES without difficulty or delay. Patient did tolerate 1/2 spoon sips of honey thick liquids without aspiration (one instance of penetration). SLP is recommending PO diet of Dys 1 (puree) solids and pudding thick liquids. 1/2 spoon sips of honey thick liquids can be given by SLP only during skilled treatment sessions. Factors that may increase risk of adverse event in presence of aspiration Noe & Lianne 2021): Factors that may increase risk of adverse event in presence of aspiration Noe & Lianne 2021): Poor general health and/or compromised immunity; Weak cough; Limited mobility; Frequent aspiration of large volumes; Dependence for feeding and/or oral hygiene; Frail or deconditioned Recommendations/Plan: Swallowing Evaluation Recommendations Swallowing Evaluation Recommendations Recommendations: PO diet PO Diet Recommendation: Dysphagia 1 (Pureed); Extremely thick liquids (Level 4, pudding thick) Liquid Administration via: Spoon Medication Administration: Crushed with puree Supervision: Full supervision/cueing for swallowing strategies; Full assist for feeding Swallowing strategies  : Slow rate; Small bites/sips Postural changes: Position pt fully upright for meals; Stay upright 30-60 min after meals Oral care recommendations: Oral care BID (2x/day); Staff/trained caregiver to provide oral care Caregiver Recommendations: Have oral suction available; Avoid jello, ice cream, thin soups, popsicles Treatment Plan Treatment Plan Treatment recommendations: Therapy as outlined in treatment plan below Follow-up recommendations: Home health SLP Functional status assessment: Patient has had a recent decline in their functional status and demonstrates the ability to make significant improvements in function in a reasonable and predictable amount of time. Treatment frequency: Min 2x/week Treatment duration: 2 weeks Interventions: Aspiration precaution training; Trials of upgraded  texture/liquids; Diet toleration management by SLP; Patient/family education; Compensatory techniques Recommendations Recommendations for follow up therapy are one component of a multi-disciplinary discharge planning process, led by the attending physician.  Recommendations may be updated based on patient status, additional functional criteria and insurance authorization. Assessment: Orofacial Exam: Orofacial Exam Oral Cavity: Oral Hygiene: WFL Oral Cavity - Dentition: Adequate natural dentition Orofacial Anatomy: WFL Oral Motor/Sensory Function: Suspected cranial nerve impairment CN V - Trigeminal: Right motor impairment CN VII - Facial: Right motor impairment CN IX - Glossopharyngeal, CN X - Vagus: Right motor impairment Anatomy: Anatomy: WFL Boluses Administered: Boluses Administered Boluses Administered: Mildly thick liquids (Level 2, nectar thick); Moderately thick liquids (Level 3, honey thick); Puree  Oral Impairment Domain: Oral Impairment Domain Lip Closure: No labial escape Tongue control during bolus hold: Not tested Bolus transport/lingual motion: Brisk tongue motion Oral residue: Complete oral clearance Location of oral residue : N/A Initiation of pharyngeal swallow : Valleculae; Pyriform sinuses  Pharyngeal Impairment Domain: Pharyngeal Impairment Domain Soft palate elevation: No bolus between soft palate (SP)/pharyngeal wall (PW) Laryngeal elevation: Partial superior movement of thyroid cartilage/partial approximation of arytenoids to epiglottic petiole Anterior hyoid excursion: Partial anterior movement Epiglottic movement: Complete inversion Laryngeal vestibule closure: Incomplete, narrow column air/contrast in laryngeal vestibule Pharyngeal stripping wave : Present - complete Pharyngeal contraction (A/P view only): N/A Pharyngoesophageal segment opening: Complete distension and complete duration, no obstruction of flow Tongue base retraction: Trace column of contrast or air between tongue base and  PPW Pharyngeal residue: Trace residue within or on pharyngeal structures Location of pharyngeal residue: Valleculae; Aryepiglottic folds  Esophageal Impairment Domain: Esophageal Impairment Domain Esophageal clearance upright position: Complete clearance, esophageal coating Pill: No data recorded Penetration/Aspiration Scale Score: Penetration/Aspiration Scale Score 2.  Material enters airway, remains ABOVE vocal cords then ejected out: Puree 8.  Material enters airway, passes BELOW cords without attempt by patient to eject out (silent aspiration) : Moderately thick liquids (Level 3, honey thick); Mildly thick liquids (Level 2, nectar thick) Compensatory Strategies: Compensatory Strategies Compensatory strategies: No   General Information: Caregiver present: Yes  Diet Prior to this Study: Extremely thick liquids (Level 4, pudding thick); Full liquid diet   Temperature : Normal   Respiratory Status: WFL   Supplemental O2: None (Room air)   History of Recent Intubation: No  Behavior/Cognition: Alert; Cooperative; Pleasant mood Self-Feeding Abilities: Dependent for feeding Baseline vocal quality/speech: Dysphonic Volitional Cough: Able to elicit Volitional Swallow: Able to elicit Exam Limitations: No limitations Goal Planning: Prognosis for improved oropharyngeal function: Fair Barriers to Reach Goals: Severity of deficits; Motivation; Overall medical prognosis No data recorded Patient/Family Stated Goal: per caregiver Melody, patient is adamant about returning home Consulted and agree with results and recommendations: Patient; Family member/caregiver Pain: Pain Assessment Pain Assessment: No/denies pain Faces Pain Scale: 0 Pain Intervention(s): Monitored during session End of Session: Start Time:SLP Start Time (ACUTE ONLY): 1605 Stop Time: SLP Stop Time (ACUTE ONLY): 1630 Time Calculation:SLP Time Calculation (min) (ACUTE ONLY): 25 min Charges: SLP Evaluations $ SLP Speech Visit: 1 Visit SLP Evaluations $BSS Swallow:  1 Procedure $MBS Swallow: 1 Procedure SLP visit diagnosis: SLP Visit Diagnosis: Dysphagia, unspecified (R13.10) Past Medical History: Past Medical History: Diagnosis Date  Diabetes mellitus without complication (HCC)   DVT (deep venous thrombosis) (HCC)   Embolus (HCC)   HTN (hypertension)  Past Surgical History: Past Surgical History: Procedure Laterality Date  TOTAL HIP ARTHROPLASTY   Norleen IVAR Blase, MA, CCC-SLP Speech Therapy   Vitals:   03/28/23 0016 03/28/23 0400 03/28/23 0800 03/28/23 1313  BP:  131/66 125/64 (!) 140/71  Pulse:  81 71   Resp: (!) 23 18 18 18   Temp:  98.8 F (37.1 C) 98.6 F (37 C) 97.8 F (36.6 C)  TempSrc:  Oral Oral Oral  SpO2:  94% 99%      PHYSICAL EXAM General:  Alert, well-nourished, well-developed elderly Caucasian male in no acute distress Psych:  Mood and affect appropriate for situation CV: Regular rate and rhythm on monitor Respiratory:  Regular, unlabored respirations on room air GI: Abdomen soft and nontender   NEURO:  Mental Status: AA&Ox3,   Speech/Language: speech is severely dysarthric but can be understood with some difficulty.  .  Cranial Nerves:  II: PERRL. Visual fields full.  Mueller, IV, VI: EOMI. Eyelids elevate symmetrically Left 6th nerve palsy V: Sensation is intact to light touch and symmetrical to face.  VII: Face is asymmetric right VIII: hearing intact to voice. IX, X: Palate elevates symmetrically. Phonation is normal.  KP:Dynloizm shrug 5/5. XII: tongue is deviated to the right Motor:  RUE 1/5 RLE  1/5 LUE 5/5 LLE 2/5 Tone: is normal and bulk is normal Sensation- Intact to light touch bilaterally. Extinction absent to light touch to DSS.   Coordination: FTN intact on the left Gait- deferred  ASSESSMENT/PLAN  Acute Ischemic Infarct:  acute infarct in the left pons  Etiology: small vessel disease in the setting of terminal right dominant vertebral artery stenosis and history of atrial fibrillation on  anticoagulation with Eliquis  Code Stroke CT head No acute abnormality. ASPECTS 10.  CTA head & neck - Widespread intracranial and extracranial atherosclerosis, including areas of hemodynamically significant stenosis (perhaps most notably Severe Stenosis of the dominant Right Vertebral V4 segment which is the sole supply to the Basilar). MRI  acute infarct left pons 2D Echo 55-60%, severely dilated LA  LDL 59 HgbA1c 8.1 VTE prophylaxis - Eliquis  Eliquis  (apixaban ) daily prior to admission, now on aspirin  81 mg daily and Eliquis  (apixaban ) daily for 3 months and then Eliquis  alone. Consider adding ASA 81mg  for 3 months given severe stenosis  Therapy recommendations:  Pending Disposition:  pending  Atrial fibrillation Home Meds: Eliquis  Continue telemetry monitoring  Hypertension Home meds:  norvasc  Stable Blood Pressure Goal: BP less than 220/110   Hyperlipidemia Home meds:  Crestor , resumed in hospital LDL 59, goal < 70 Continue statin at discharge  Diabetes type II Uncontrolled Home meds:  glipizide  HgbA1c 8.1, goal < 7.0 CBGs SSI Recommend close follow-up with PCP for better DM control  Dysphagia Patient has post-stroke dysphagia, SLP consulted    Diet   DIET - DYS 1 Room service appropriate? Yes; Fluid consistency: Pudding Thick  Caregivers report aspiration issues prior to admission Advance diet as tolerated  Other Stroke Risk Factors Obesity, There is no height or weight on file to calculate BMI., BMI >/= 30 associated with increased stroke risk, recommend weight loss, diet and exercise as appropriate   Other Active Problems AKI Patient presented with a serum creatinine of 2.02.  After IV fluids, his creatinine did improve to 1.78.  Will administer additional IV fluids today, I suspect that his AKI is due to dehydration. LR 125cc/hour for 10 hours  Severe obstructive sleep apnea Patient has severe OSA and uses home BiPAP at night. Will order BiPAP nightly  here Hospice Patient is on hospice for senile degeneration of the brain, he is full code, he is on a home regimen of seroquel  25 mg nightly.  Will hold off on oral medications  Hypothyroidism  On Synthroid  25 mcg, will continue home Synthroid   Hospital day # 2    Patient with chronic atrial fibrillation on Eliquis  presented with sudden onset of dysarthria and right hemiparesis due to left paramedian pontine infarct.  Multiple etiologies possible atrial fibrillation despite anticoagulation versus terminal right vertebral artery symptomatic stenosis as well as small vessel disease.  Recommend adding aspirin  81 mg daily to Eliquis  for 3 months and intracranial stenosis and due to increased bleeding risk we will not do triple therapy.  Continue dysphagia 1 diet and follow-up with speech therapy for swallow difficulty.  Continue physical occupational and speech therapy.  Aggressive risk factor modification.  Long discussion with patient and with his daughter at the bedside and answered questions.  Discussed with caregivers.  Patient prefers to go home if possible and get therapy at home as he has excellent care at home.  Discussed with patient and caregivers at bedside.  Stroke team will sign off.  Kindly call for questions greater than 50% time during this 35 minute visit was spent in counseling and coordination of care about his pontine stroke and discussion binetrakin stenosis as well as atrial fibrillation and answering questions.  Eather Popp, MD Medical Director [facility] Pager: [phone] 03/28/2023 2:18 PM   To contact Stroke Continuity provider, please refer to [email or web address]. After hours, contact General Neurology

## 2023-03-28 NOTE — Progress Notes (Signed)
 Speech Language Pathology Treatment: Dysphagia  Patient Details Name: Troy Mueller MRN: [id number] DOB: 31-Aug-1932 Today's Date: 03/28/2023 Time: 8379-8368 SLP Time Calculation (min) (ACUTE ONLY): 11 min  Assessment / Plan / Recommendation Clinical Impression  Pt with audible secretions at rest that were unable to be expectorated with a cued congested cough. Honey thick liquids via 1/2 tspn resulted in an immediate cough in 1/4 trials. Pudding thick liquids and purees did not result in further coughing, although his wet quality of voice was not resolved throughout the session. Reviewed MBS results with pt and his caregivers. Education was provided regarding aspiration precautions, with which they state they are familiar and implemented at home PTA. Encouraged them to perform oral care QID and before/after POs. Recommend continuing current diet of Dys 1 textures with pudding thick liquids and full supervision. Ensure pt is positioned as upright as possible and remains upright for ~30 minutes after eating/drinking. Pt may continue to benefit from ongoing SLP f/u to target dysphagia goals and reinforce education.    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 He was kept NPO after failing Yale swallow with RN. 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 He was seen by SLP at [facility] during an admission in late October of 2024 with evaluation and treatment for dysphagia as well as speech/voice.  He is actively on hospice. Bedside swallow evaluation completed 03/26/23 and recommended full liquids/pudding thick and to proceed with MBS. Patient declined to have MBS on 1/11 when SLP evaluating but RN secure messaged SLP in early evening and patient had then  agreed to do MBS.      SLP Plan  Continue with current plan of care  Patient needs continued Speech Lanaguage Pathology Services   Recommendations for follow up therapy are one component of a multi-disciplinary discharge planning process, led by the attending physician.  Recommendations may be updated based on patient status, additional functional criteria and insurance authorization.    Recommendations  Diet recommendations: Dysphagia 1 (puree);Pudding-thick liquid Liquids provided via: Cup;Teaspoon Medication Administration: Crushed with puree Supervision: Staff to assist with self feeding;Full supervision/cueing for compensatory strategies Compensations: Slow rate;Small sips/bites Postural Changes and/or Swallow Maneuvers: Seated upright 90 degrees                  Oral care QID;Oral care before and after PO   Frequent or constant Supervision/Assistance Dysphagia, unspecified (R13.10)     Continue with current plan of care     [name], M.A., CF-SLP Speech Language Pathology, Acute Rehabilitation Services  Secure Chat preferred [phone]   03/28/2023, 4:59 PM

## 2023-03-28 NOTE — Evaluation (Signed)
 Speech Language Pathology Evaluation Patient Details Name: Troy Mueller MRN: [id number] DOB: 16-Sep-1932 Today's Date: 03/28/2023 Time: 8396-8379 SLP Time Calculation (min) (ACUTE ONLY): 17 min  Problem List:  Patient Active Problem List   Diagnosis Date Noted   Acute cerebral infarction (HCC) 03/26/2023   Type 2 diabetes mellitus (HCC) 03/26/2023   AKI (acute kidney injury) (HCC) 03/26/2023   OSA (obstructive sleep apnea) 01/13/2023   Acute on chronic respiratory failure with hypoxia and hypercapnia (HCC) 01/13/2023   Acute respiratory failure with hypoxia and hypercapnia (HCC) 01/12/2023   Dyslipidemia 01/12/2023   Essential hypertension 01/12/2023   Hypothyroidism 01/12/2023   CAP (community acquired pneumonia) 01/05/2023   Acute hypoxic on chronic hypercapnic respiratory failure (HCC) 01/01/2023   SDH (subdural hematoma) (HCC) 01/20/2022   Fall at home, initial encounter 01/20/2022   Hyperkalemia 01/20/2022   Acute metabolic encephalopathy 01/20/2022   DVT (deep venous thrombosis) (HCC) 01/20/2022   HTN (hypertension) 01/20/2022   Chronic kidney disease, stage 3b (HCC) 01/20/2022   Type II diabetes mellitus with renal manifestations (HCC) 01/20/2022   Atrial fibrillation (HCC) 01/20/2022   HLD (hyperlipidemia) 01/20/2022   Obesity (BMI 30-39.9) 01/20/2022   Past Medical History:  Past Medical History:  Diagnosis Date   Diabetes mellitus without complication (HCC)    DVT (deep venous thrombosis) (HCC)    Embolus (HCC)    HTN (hypertension)    Past Surgical History:  Past Surgical History:  Procedure Laterality Date   TOTAL HIP ARTHROPLASTY     HPI:  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 He was kept NPO after failing Yale swallow with RN. 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 He was seen by SLP at [facility] during an admission in late October of 2024 with evaluation and treatment for dysphagia as well as speech/voice.  He is actively on hospice. Bedside swallow evaluation completed 03/26/23 and recommended full liquids/pudding thick and to proceed with MBS. Patient declined to have MBS on 1/11 when SLP evaluating but RN secure messaged SLP in early evening and patient had then agreed to do MBS.   Assessment / Plan / Recommendation Clinical Impression  Per pt's caregivers, pt's cognition has improved significantly since recent hospitalization. They state his memory, attention, and problem solving are now better than they have been in ~1 year. He is oriented x4 but with poor insight into current deficits and their effect on his level of function. His memory and attention appear overall intact for functional activities. He is able to participate in conversation with no obvious syntactical or semantic deficits noted. His speech and voice significantly affect his intelligibility at the phrase level. This is further affected by his respiration. His voice is strained and wet. His speech is characterized by imprecise consonants and impaired pitch range. His sentences are halting and choppy. When cued to repeat phrases using speech intelligibility strategies, his intelligibility improves but this is difficult and requires substantial effort. SLP provided education regarding carryover of strategies and POC with pt and his caregivers, who share improving his speech is a priority. Will continue following.    SLP Assessment  SLP Recommendation/Assessment: Patient needs continued Speech Lanaguage Pathology Services SLP Visit Diagnosis: Dysarthria and anarthria (R47.1);Cognitive communication deficit (R41.841)  Recommendations for follow up therapy are one component of a multi-disciplinary discharge planning process, led by the  attending physician.  Recommendations may be updated based on patient status, additional functional criteria and insurance authorization.    Follow Up Recommendations  Skilled nursing-short term rehab (<3 hours/day)    Assistance Recommended at Discharge  Frequent or constant Supervision/Assistance  Functional Status Assessment Patient has had a recent decline in their functional status and demonstrates the ability to make significant improvements in function in a reasonable and predictable amount of time.  Frequency and Duration min 2x/week  2 weeks      SLP Evaluation Cognition  Overall Cognitive Status: History of cognitive impairments - at baseline Arousal/Alertness: Awake/alert Orientation Level: Oriented X4 Attention: Sustained Sustained Attention: Appears intact Memory: Appears intact Awareness: Impaired Awareness Impairment: Emergent impairment Problem Solving: Impaired Problem Solving Impairment: Functional basic       Comprehension  Auditory Comprehension Overall Auditory Comprehension: Appears within functional limits for tasks assessed    Expression Expression Primary Mode of Expression: Verbal Verbal Expression Overall Verbal Expression: Appears within functional limits for tasks assessed   Oral / Motor  Oral Motor/Sensory Function Overall Oral Motor/Sensory Function: Moderate impairment Facial ROM: Reduced right;Suspected CN VII (facial) dysfunction Facial Symmetry: Abnormal symmetry right;Suspected CN VII (facial) dysfunction Facial Strength: Reduced right Lingual ROM: Reduced right Lingual Strength: Reduced Velum: Within Functional Limits Mandible: Within Functional Limits Motor Speech Overall Motor Speech: Impaired Respiration: Impaired Level of Impairment: Phrase Phonation: Hoarse;Wet Resonance: Within functional limits Articulation: Impaired Level of Impairment: Sentence Intelligibility: Intelligibility reduced Word: 75-100% accurate Phrase:  50-74% accurate Sentence: 25-49% accurate            [name], M.A., CF-SLP Speech Language Pathology, Acute Rehabilitation Services  Secure Chat preferred [phone]  03/28/2023, 4:56 PM

## 2023-03-28 NOTE — Progress Notes (Signed)
 Family brought in home unit, unit set up, tried to put it on, pt refused.

## 2023-03-28 NOTE — TOC Initial Note (Addendum)
 Transition of Care [facility]) - Initial/Assessment Note    Patient Details  Name: Troy Mueller MRN: [id number] Date of Birth: 05-05-1932  [facility]) CM/SW Contact:    Andrez JULIANNA George, RN Phone Number: 03/28/2023, 10:56 AM  Clinical Narrative:                  Pt is from home with 24 hour caregivers. He has needed DME.  Caregiver provides needed transportation. Pt has been active with [facility] at home for hospice services.  Per caregiver at the bedside pt is interested in rehab to increase right sided strength and work on his speech. CM has left a voicemail for pts daughter.  Therapies had signed off but re-ordered for potential rehab needs.  TOC following.  1525: daughter prefers rehab after dc. Awaiting therapies to re-eval.  TOC following.   Expected Discharge Plan: Skilled Nursing Facility Barriers to Discharge: Continued Medical Work up   Patient Goals and CMS Choice   CMS Medicare.gov Compare Post Acute Care list provided to:: Patient Represenative (must comment) Choice offered to / list presented to : Patient, Adult Children      Expected Discharge Plan and Services   Discharge Planning Services: CM Consult   Living arrangements for the past 2 months: Single Family Home                                      Prior Living Arrangements/Services Living arrangements for the past 2 months: Single Family Home Lives with:: Self (24 hour caregivers) Patient language and need for interpreter reviewed:: Yes Do you feel safe going back to the place where you live?: Yes      Need for Family Participation in Patient Care: Yes (Comment) Care giver support system in place?: Yes (comment) Current home services: DME (oxygen/ bipap/ bike/ hoyer/ wheelchair/ hospital bed/ wheelchair fleeta) Criminal Activity/Legal Involvement Pertinent to Current Situation/Hospitalization: No - Comment as needed  Activities of Daily Living   ADL Screening (condition at  time of admission) Independently performs ADLs?: No Does the patient have a NEW difficulty with bathing/dressing/toileting/self-feeding that is expected to last >3 days?: No Does the patient have a NEW difficulty with getting in/out of bed, walking, or climbing stairs that is expected to last >3 days?: No Does the patient have a NEW difficulty with communication that is expected to last >3 days?: Yes (Initiates electronic notice to provider for possible SLP consult) Is the patient deaf or have difficulty hearing?: No Does the patient have difficulty seeing, even when wearing glasses/contacts?: No Does the patient have difficulty concentrating, remembering, or making decisions?: Yes  Permission Sought/Granted                  Emotional Assessment Appearance:: Appears stated age Attitude/Demeanor/Rapport:  (dysarthria)   Orientation: : Oriented to Self, Oriented to Place, Oriented to Situation   Psych Involvement: No (comment)  Admission diagnosis:  Acute ischemic stroke (HCC) [I63.9] Acute encephalopathy [G93.40] AKI (acute kidney injury) (HCC) [N17.9] Patient Active Problem List   Diagnosis Date Noted   Acute cerebral infarction (HCC) 03/26/2023   Type 2 diabetes mellitus (HCC) 03/26/2023   AKI (acute kidney injury) (HCC) 03/26/2023   OSA (obstructive sleep apnea) 01/13/2023   Acute on chronic respiratory failure with hypoxia and hypercapnia (HCC) 01/13/2023   Acute respiratory failure with hypoxia and hypercapnia (HCC) 01/12/2023   Dyslipidemia 01/12/2023  Essential hypertension 01/12/2023   Hypothyroidism 01/12/2023   CAP (community acquired pneumonia) 01/05/2023   Acute hypoxic on chronic hypercapnic respiratory failure (HCC) 01/01/2023   SDH (subdural hematoma) (HCC) 01/20/2022   Fall at home, initial encounter 01/20/2022   Hyperkalemia 01/20/2022   Acute metabolic encephalopathy 01/20/2022   DVT (deep venous thrombosis) (HCC) 01/20/2022   HTN (hypertension)  01/20/2022   Chronic kidney disease, stage 3b (HCC) 01/20/2022   Type II diabetes mellitus with renal manifestations (HCC) 01/20/2022   Atrial fibrillation (HCC) 01/20/2022   HLD (hyperlipidemia) 01/20/2022   Obesity (BMI 30-39.9) 01/20/2022   PCP:  Diedra Lame, MD Pharmacy:   [facility] - [place], [place] - [address] [place] [address] Phone: [phone] Fax: [phone]     Social Drivers of Health (SDOH) Social History: SDOH Screenings   Food Insecurity: No Food Insecurity (03/27/2023)  Housing: Low Risk  (03/27/2023)  Transportation Needs: No Transportation Needs (03/27/2023)  Utilities: Not At Risk (03/27/2023)  Financial Resource Strain: Low Risk  (08/05/2022)   Received from [facility]  Social Connections: Moderately Integrated (03/27/2023)  Tobacco Use: Low Risk  (03/26/2023)   SDOH Interventions:     Readmission Risk Interventions     No data to display

## 2023-03-28 NOTE — Plan of Care (Signed)
   Problem: Education: Goal: Knowledge of General Education information will improve Description: Including pain rating scale, medication(s)/side effects and non-pharmacologic comfort measures Outcome: Progressing   Problem: Activity: Goal: Risk for activity intolerance will decrease Outcome: Progressing   Problem: Coping: Goal: Level of anxiety will decrease Outcome: Progressing

## 2023-03-28 NOTE — Plan of Care (Signed)
 Has been more alert and talkative today.  Is agreeing to work with therapy and to go to acute rehab instead of going home and having home care.  Has 24 hour care givers but family and caregivers spoke with him at length to see if they could get him to go to rehab to get even better and stronger.  He is willing and wants to get stronger so he can get back home.   Problem: Clinical Measurements: Goal: Will remain free from infection Outcome: Progressing   Problem: Clinical Measurements: Goal: Respiratory complications will improve Outcome: Progressing   Problem: Activity: Goal: Risk for activity intolerance will decrease Outcome: Progressing   Problem: Nutrition: Goal: Adequate nutrition will be maintained Outcome: Progressing   Problem: Coping: Goal: Level of anxiety will decrease Outcome: Progressing   Problem: Safety: Goal: Ability to remain free from injury will improve Outcome: Progressing   Problem: Skin Integrity: Goal: Risk for impaired skin integrity will decrease Outcome: Progressing   Problem: Coping: Goal: Will verbalize positive feelings about self Outcome: Progressing   Problem: Nutrition: Goal: Risk of aspiration will decrease Outcome: Progressing

## 2023-03-28 NOTE — Progress Notes (Signed)
   03/28/23 0016  BiPAP/CPAP/SIPAP  Reason BIPAP/CPAP not in use Non-compliant  BiPAP/CPAP /SiPAP Vitals  Resp (!) 23  MEWS Score/Color  MEWS Score 1  MEWS Score Color [place] Si

## 2023-03-29 ENCOUNTER — Inpatient Hospital Stay (HOSPITAL_COMMUNITY)

## 2023-03-29 DIAGNOSIS — E119 Type 2 diabetes mellitus without complications: Secondary | ICD-10-CM | POA: Diagnosis not present

## 2023-03-29 DIAGNOSIS — G4733 Obstructive sleep apnea (adult) (pediatric): Secondary | ICD-10-CM | POA: Diagnosis not present

## 2023-03-29 DIAGNOSIS — I639 Cerebral infarction, unspecified: Secondary | ICD-10-CM | POA: Diagnosis not present

## 2023-03-29 DIAGNOSIS — I4891 Unspecified atrial fibrillation: Secondary | ICD-10-CM | POA: Diagnosis not present

## 2023-03-29 LAB — CBC
HCT: 37.3 % — ABNORMAL LOW (ref 39.0–52.0)
Hemoglobin: 12.3 g/dL — ABNORMAL LOW (ref 13.0–17.0)
MCH: 31.1 pg (ref 26.0–34.0)
MCHC: 33 g/dL (ref 30.0–36.0)
MCV: 94.2 fL (ref 80.0–100.0)
Platelets: 151 10*3/uL (ref 150–400)
RBC: 3.96 MIL/uL — ABNORMAL LOW (ref 4.22–5.81)
RDW: 13.7 % (ref 11.5–15.5)
WBC: 6.3 10*3/uL (ref 4.0–10.5)
nRBC: 0 % (ref 0.0–0.2)

## 2023-03-29 LAB — RENAL FUNCTION PANEL
Albumin: 3.1 g/dL — ABNORMAL LOW (ref 3.5–5.0)
Anion gap: 10 (ref 5–15)
BUN: 27 mg/dL — ABNORMAL HIGH (ref 8–23)
CO2: 23 mmol/L (ref 22–32)
Calcium: 9.4 mg/dL (ref 8.9–10.3)
Chloride: 104 mmol/L (ref 98–111)
Creatinine, Ser: 1.7 mg/dL — ABNORMAL HIGH (ref 0.61–1.24)
GFR, Estimated: 38 mL/min — ABNORMAL LOW (ref >60)
Glucose, Bld: 160 mg/dL — ABNORMAL HIGH (ref 70–99)
Phosphorus: 3.5 mg/dL (ref 2.5–4.6)
Potassium: 4.6 mmol/L (ref 3.5–5.1)
Sodium: 137 mmol/L (ref 135–145)

## 2023-03-29 LAB — GLUCOSE, CAPILLARY
Glucose-Capillary: 145 mg/dL — ABNORMAL HIGH (ref 70–99)
Glucose-Capillary: 218 mg/dL — ABNORMAL HIGH (ref 70–99)
Glucose-Capillary: 244 mg/dL — ABNORMAL HIGH (ref 70–99)

## 2023-03-29 MED ORDER — IPRATROPIUM-ALBUTEROL 0.5-2.5 (3) MG/3ML IN SOLN
3.0000 mL | Freq: Four times a day (QID) | RESPIRATORY_TRACT | Status: AC | PRN
  Administered 2023-03-29: 3 mL via RESPIRATORY_TRACT
  Filled 2023-03-29: qty 3

## 2023-03-29 NOTE — TOC Progression Note (Signed)
 Transition of Care [facility]) - Progression Note    Patient Details  Name: [name] MRN: [id number] Date of Birth: 1932-08-01  [facility]) CM/SW Contact  Andrez JULIANNA George, RN Phone Number: 03/29/2023, 4:27 PM  Clinical Narrative:     Pt and family has decided to d/c home with therapies at home. [facility] is working to see how much therapy they can provide at home. They will update CM in the am.  Family plans to supplement therapy at home as well.  Per caregivers plan is for CXR tonight.  Pt will need ambulance transport home. TOC following.   Expected Discharge Plan: Skilled Nursing Facility Barriers to Discharge: Continued Medical Work up  Expected Discharge Plan and Services   Discharge Planning Services: CM Consult   Living arrangements for the past 2 months: Single Family Home                                       Social Determinants of Health (SDOH) Interventions SDOH Screenings   Food Insecurity: No Food Insecurity (03/27/2023)  Housing: Low Risk  (03/27/2023)  Transportation Needs: No Transportation Needs (03/27/2023)  Utilities: Not At Risk (03/27/2023)  Financial Resource Strain: Low Risk  (08/05/2022)   Received from [facility]  Social Connections: Moderately Integrated (03/27/2023)  Tobacco Use: Low Risk  (03/26/2023)    Readmission Risk Interventions     No data to display

## 2023-03-29 NOTE — Plan of Care (Addendum)
 Brief Neuro Note:  Primary tem had reached out about concern for mild L sided facial droop and ?mild left sied weakness in addition to his R sided weakness at presentation from a left pontine stroke. [name] is currently on aspirin  and eliquis  and has not had his night dose of eliquis .  I requested team to hold his night dose of eliquis , get STAT CT Head to rule out ICH and if no ICH, get a MRI Brain w/o contrast routinely. If there is no ICH, combination of aspirin  and eliquis  is the maximal medical therapy for stroke, repeating full stroke workup again will not change management.  Update 10:57 PM: CT Head with no ICH, continue Eliquis  ans aspirin . Will add him to stroke team's rounding list.   Fairley Copher [facility]

## 2023-03-29 NOTE — Progress Notes (Signed)
   Th night team got paged by the nurse for Dr Perri.  Dr Pousson  is a 89 year old male ,hx of Diabetes ,OSA and Afib on Eliquis  who initially presented with right sided focal neurological deficit  and found to have an acute left pontine infarct. His nurse reached out to us  due to concerns for a new left sided  weakness and facial droop that wasn't there before. Patient was seen and evaluated at bedside.Family was present at bedside.  Rapid response were called before we got there.   On exam, patient was laying in bed in no acute distress but did have significant left facial weakness and left eye droop. Had Left UE and LE strength of 1/5 but sensation was otherwise intact. ( Please see extensive neuro exam below) .His skin was warm and dry. Cardiac exam noted for irregularly irregular rhythm. No murmurs were appreciated. Mild bilateral wheezing on lung exam.  Due to his recent infarct and his new neurological deficits, I worry that he might be having a new stroke.Will order a CXR and also get non contrast CT head to evaluate for hemorrhaging  and consult neurology immediately. If no evidence of hemorrhage , will follow up with MRI head.   Family updated.     Mental Status: Patient is awake, alert Cranial Nerves: II: Pupils equal, round, and reactive to light.   III,IV, VI: Mild inward deviation of left eye ( improved on re-examination ) , otherwise EOMI V: Facial sensation is symmetric to light touch and temperature. VII: Facial movement is asymmetric. Persistent and stable right right sided facial droop with new mild left facial droop  VIII: Hearing is intact to voice X: Deferred  XI: Shoulder shrug is asymmetric.Weaker on the left  XII: Not done  Motor: Stable right LE and UE deficits, 1/5 throughout . Limited by effort LUE 2/5 and LLE 2/5.Left upper extremity improved - 3/5 improved on re-examination Sensory: Sensation is grossly intact both bilateral UE & LE   Signature: Troy Mueller , MD Internal Medicine Resident, PGY-1 [facility]  Pager: [phone] 9:04 PM, 03/29/2023   Please contact the on call pager after 5 pm and on weekends at [phone].

## 2023-03-29 NOTE — Progress Notes (Signed)
 [facility] Hospitalized Hospice Patient   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  Patient was admitted to [facility] on 1[facility]11.25 with diagnosis of Encepholophy.  Per Dr. Odella Pepper, with [facility].  This is a related hospital admission.   Visited with patient and family at bedside. Patient is more lethargic today and was unable to do much with PT prior to my visit. Discussed care with patient's daughter who had previously thought that SNF for rehab may be beneficial. However, this morning she is unsure and will want to see how he does throughout the day to help make that decision. Discussing with TOC as well to assist with discharge as needed.    Patient is appropriate for GIP to monitor new side effect of stroke requiring close monitoring by skilled personnel with frequent skilled assessment following left pons infarct.   Vital Signs:  98.5/62/18    104/55     O2 94%   Abnormal Labs:  BUN 27, Cre 1.7, Alb 3.1, GFRe 38, HGB 12.3  Diagnostic Imaging: none new   IV Medications: None   Problem list from note 1.13.25 #Acute encephalopathy #Acute Left Paramedian Pontine Stroke  Patient would like to get more strength back before feeling he can safely go home. PT had recommended HH PT, but they have been re consulted for further recommendations. TOC placed order, PT eval pending. SLP eval yesterday recommended Dysphagia 1 pureed solid foods. SLP order placed for further language and speech assessment. Dysarthria seems to have improved. Residual motor deficits on the right still present. Subjectively the patient feels he is slowly improving. Would benefit from aggressive PT, but unlikely that he would go back completely to his baseline. Appreciate Neuro, PT/OT and Speech. Has some crackles on the lower right lung. Will add on spirometry.   -PT re-eval pending  -Speech re-eval pending   -Dysphagia 1 diet  -ASA 81 mg daily for 3 months per neuro  -Cw Eliquis  2.5mg  BID    #CKD Stage IIIb #AKI - resolved Patient presented with a serum creatinine of 2.02.  After IV fluids, his creatinine did improve and it is back to baseline (~1.3-1.4) at 1.6 today. Patient able to have pureed foods.    # Severe obstructive sleep apnea Patient has severe OSA and uses home BiPAP at night. Pt refused last night. -cw BiPap    #Senile Degeneration of the brain . # Hospice Patient is on hospice for senile degeneration of the brain, he is full code. Palliative through [facility]. He is on a home regimen of seroquel  nightly.  -can restart Seroquel   -Delirium precautions      Discharge Plan:  discussed options for home with hospice vs SNF placement for rehab  IDT:  updated  Family Contact:  spoke with daughter/HCPOA Kingsley Das at bedside  Goals of Care:  Clear, patient is a full code per his request.   Please do not hesitate to call with any hospice related questions or concerns.   Elouise Husband, BSN, RN, [facility] [phone]

## 2023-03-29 NOTE — Care Management Important Message (Signed)
 Important Message  Patient Details  Name: Troy Mueller MRN: [id number] Date of Birth: 1932/09/15   Important Message Given:  Yes - Medicare IM     Dorena Bodo 03/29/2023, 2:03 PM

## 2023-03-29 NOTE — Evaluation (Signed)
 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   Clinical Impression   OT re-evaluation completed per family request. Pt is dependent for bed level ADLs and requires a hoyer lift OOB 1x/day with 24/7 caregivers at baseline. Upon evaluation the pt was limited by new R flaccidity. Per pt, family and caregiver he is typically able to self-feed and groom with his dominant RUE and set up A. Currently he would need max A and use of LUE to complete those UB tasks. Pt will benefit from continued acute OT services to address self-feeding, grooming, dependent positioning of R hemibody and neuro re-education of RUE. Due to low-level baseline and significant caregiver assist at home, recommend pt d/c home with HHOT, discussed with pt's POA/daughter and she verbalized understanding.         If plan is discharge home, recommend the following: Two people to help with walking and/or transfers;Two people to help with bathing/dressing/bathroom;Assistance with feeding;Direct supervision/assist for medications management;Direct supervision/assist for financial management;Supervision due to cognitive status;Assist for transportation    Functional Status Assessment  Patient has had a recent decline in their functional status and demonstrates the ability to make significant improvements in function in a reasonable and predictable amount of time.  Equipment Recommendations  None recommended by OT    Recommendations for Other Services       Precautions / Restrictions Precautions Precautions:  Fall Restrictions Weight Bearing Restrictions Per Provider Order: No      Mobility Bed Mobility Overal bed mobility: Needs Assistance             General bed mobility comments: totalAx2 for rolling R for placement of pillow under L side for neutral alignment    Transfers                   General transfer comment: deferred as hoyer lift at baseline      Balance                                           ADL either performed or assessed with clinical judgement   ADL Overall ADL's : Needs assistance/impaired Eating/Feeding: Maximal assistance Eating/Feeding Details (indicate cue type and reason): would need to use L hand, pt's family states he has been refusing to attempt with non-dom L hand. RUE is flaccid. Grooming: Maximal assistance   Upper Body Bathing: Total assistance   Lower Body Bathing: Total assistance   Upper Body Dressing : Total assistance   Lower Body Dressing: Total assistance   Toilet Transfer: Total assistance   Toileting- Clothing Manipulation and Hygiene: Total assistance   Tub/ Shower Transfer: Total assistance   Functional mobility during ADLs: Total assistance;+2 for physical assistance       Vision Baseline Vision/History: 0 No visual deficits Vision Assessment?: No apparent visual deficits     Perception Perception: Not tested       Praxis Praxis: Not tested       Pertinent Vitals/Pain Pain Assessment Pain Assessment: No/denies pain Pain Intervention(s): Monitored  during session     Extremity/Trunk Assessment Upper Extremity Assessment Upper Extremity Assessment: RUE deficits/detail RUE Deficits / Details: flaccid, no activation noted. full PROM. denies senation changes RUE Sensation: WNL RUE Coordination: decreased fine motor;decreased gross motor   Lower Extremity Assessment Lower Extremity Assessment: Defer to PT evaluation RLE Deficits / Details: PROM WFL, flicker of muscle activation with  hip flexion and knee extension, unable to perform meaningful movement LLE Deficits / Details: h/o L foot drop per caregivers       Communication Communication Communication: Difficulty following commands/understanding;Difficulty communicating thoughts/reduced clarity of speech Following commands: Follows one step commands consistently Cueing Techniques: Verbal cues;Tactile cues   Cognition Arousal: Alert Behavior During Therapy: Flat affect Overall Cognitive Status: History of cognitive impairments - at baseline                                 General Comments: follows one step commands, answers limited questions from daughter     General Comments  daughter and 3 caregivers present    Exercises     Shoulder Instructions      Home Living Family/patient expects to be discharged to:: Private residence Living Arrangements: Alone Available Help at Discharge: Personal care attendant;Available 24 hours/day Type of Home: House Home Access: Ramped entrance     Home Layout: One level     Bathroom Shower/Tub: Sponge bathes at baseline   [employer]:  (bedpan at baseline)     Home Equipment: Wheelchair - manual;Hospital bed;Other (comment)   Additional Comments: 2 caregivers present to assist with history and setup; they report 16 caregivers in all to provide 24/7 support (1 at night and 2 during the day). pt also with supportive children nearby who are very involved with care  Lives With: Other (Comment) (16 care givers to give 24/7, 2 during day 1 at night)    Prior Functioning/Environment Prior Level of Function : Needs assist             Mobility Comments: dependent mobility at baseline; caregivers lift via hoyer to NuStep 1x/day but otherwise stays in bed. has access to a PT at home if needed when he's agreeable ADLs Comments: bed-level ADLs with assist, typically self feeds with R hand        OT Problem List: Decreased strength;Decreased range  of motion;Decreased activity tolerance;Impaired vision/perception;Decreased coordination;Decreased cognition;Decreased safety awareness;Decreased knowledge of use of DME or AE;Decreased knowledge of precautions;Impaired UE functional use      OT Treatment/Interventions:      OT Goals(Current goals can be found in the care plan section) Acute Rehab OT Goals Patient Stated Goal: per dtr, to get pt best rehab OT Goal Formulation: With patient Time For Goal Achievement: 04/12/23 Potential to Achieve Goals: Fair ADL Goals Pt Will Perform Eating: with set-up Pt Will Perform Grooming: with set-up  OT Frequency: Min 1X/week    Co-evaluation PT/OT/SLP Co-Evaluation/Treatment: Yes Reason for Co-Treatment: For patient/therapist safety;To address functional/ADL transfers;Necessary to address cognition/behavior during functional activity PT goals addressed during session: Mobility/safety with mobility OT goals addressed during session: ADL's and self-care      AM-PAC OT 6 Clicks Daily Activity     Outcome Measure Help from another person eating meals?: A Lot Help from another person taking care of personal grooming?: A Lot Help from another person toileting, which includes using toliet, bedpan, or urinal?: Total Help from another person bathing (including washing, rinsing, drying)?: Total Help  from another person to put on and taking off regular upper body clothing?: Total Help from another person to put on and taking off regular lower body clothing?: Total 6 Click Score: 8   End of Session Nurse Communication: Mobility status  Activity Tolerance: Patient tolerated treatment well Patient left: in bed;with call bell/phone within reach;with bed alarm set;with family/visitor present  OT Visit Diagnosis: Other abnormalities of gait and mobility (R26.89);Muscle weakness (generalized) (M62.81);Cognitive communication deficit (R41.841);Other symptoms and signs involving cognitive  function Symptoms and signs involving cognitive functions: Cerebral infarction                Time: 9069-9040 OT Time Calculation (min): 29 min Charges:  OT General Charges $OT Visit: 1 Visit OT Evaluation $OT Eval Moderate Complexity: 1 Mod  Lucie Kendall, OTR/L Acute Rehabilitation Services Office [phone] Secure Chat Communication Preferred   Lucie JONETTA Kendall 03/29/2023, 2:08 PM

## 2023-03-29 NOTE — Inpatient Diabetes Management (Signed)
 Inpatient Diabetes Program Recommendations  AACE/ADA: New Consensus Statement on Inpatient Glycemic Control (2015)  Target Ranges:  Prepandial:   less than 140 mg/dL      Peak postprandial:   less than 180 mg/dL (1-2 hours)      Critically ill patients:  140 - 180 mg/dL   Lab Results  Component Value Date   GLUCAP 145 (H) 03/29/2023   HGBA1C 8.1 (H) 03/26/2023    Review of Glycemic Control  Latest Reference Range & Units 03/28/23 07:53 03/28/23 15:20 03/28/23 22:09 03/29/23 06:14  Glucose-Capillary 70 - 99 mg/dL 790 (H) 783 (H) 792 (H) 145 (H)   Diabetes history: DM 2 Outpatient Diabetes medications: Glipizide  5 mg Daily Current orders for Inpatient glycemic control:  Glipizide  5 mg Daily  A1c 8.1% on 1/11  Inpatient Diabetes Program Recommendations:    -   Add Novolog  0-9 units tid + hs scale while inpatient  Thanks,  Clotilda Bull RN, MSN, BC-ADM Inpatient Diabetes Coordinator Team Pager [phone] (8a-5p)

## 2023-03-29 NOTE — Progress Notes (Signed)
 1600 Temp was 99.5, have some crackles noted upon respiratory assessment. Caregiver are very concerned and want it the doctor to be notified, did informed the Doctor, and the Doctor said to continue to watch his next vitals signs and if worsening, to inform them.No respiratory distress.

## 2023-03-29 NOTE — Plan of Care (Signed)
 Has been more cooperative today, still with slurred speech. Eating well and tolerated. Caregiver and family are at bedside.  Problem: Health Behavior/Discharge Planning: Goal: Ability to manage health-related needs will improve Outcome: Progressing   Problem: Clinical Measurements: Goal: Respiratory complications will improve Outcome: Progressing   Problem: Nutrition: Goal: Adequate nutrition will be maintained Outcome: Progressing   Problem: Safety: Goal: Ability to remain free from injury will improve Outcome: Progressing   Problem: Skin Integrity: Goal: Risk for impaired skin integrity will decrease Outcome: Progressing   Problem: Ischemic Stroke/TIA Tissue Perfusion: Goal: Complications of ischemic stroke/TIA will be minimized Outcome: Progressing

## 2023-03-29 NOTE — Progress Notes (Addendum)
 Subjective:  Tried today as his sleep was not that great yesterday. Limited examination as patient was sleeping.   Objective:  Vital signs in last 24 hours: Vitals:   03/29/23 0200 03/29/23 0330 03/29/23 0722 03/29/23 1123  BP:  115/61 (!) 104/55 126/77  Pulse:  71 62 74  Resp: 10 19 18 19   Temp:  97.9 F (36.6 C) 98.5 F (36.9 C) 98.3 F (36.8 C)  TempSrc:  Axillary Oral Axillary  SpO2:  95% 94% 90%   Physical Exam: General: No acute distress, laying in bed Cardiac: Irregularly irregular rhythm, regular rate, no murmurs appreciated Pulmonary: Normal effort on room air -Right facial droop. -Right sided weakness still present. Unable to lift right extremity against gravity. Barely moves toes on right. -Tone and bulk normal Skin: Warm and dry     Latest Ref Rng & Units 03/29/2023    5:02 AM 03/28/2023    5:53 AM 03/27/2023    6:29 AM  CBC  WBC 4.0 - 10.5 K/uL 6.3  6.8  5.6   Hemoglobin 13.0 - 17.0 g/dL 87.6  87.1  87.3   Hematocrit 39.0 - 52.0 % 37.3  39.2  38.4   Platelets 150 - 400 K/uL 151  164  175        Latest Ref Rng & Units 03/29/2023    5:02 AM 03/28/2023    5:53 AM 03/27/2023    6:29 AM  BMP  Glucose 70 - 99 mg/dL 839  821  830   BUN 8 - 23 mg/dL 27  23  26    Creatinine 0.61 - 1.24 mg/dL 8.29  8.38  8.21   Sodium 135 - 145 mmol/L 137  136  137   Potassium 3.5 - 5.1 mmol/L 4.6  4.6  4.9   Chloride 98 - 111 mmol/L 104  104  103   CO2 22 - 32 mmol/L 23  23  25    Calcium  8.9 - 10.3 mg/dL 9.4  9.6  9.7     Assessment/Plan:  Principal Problem:   Acute cerebral infarction (HCC) Active Problems:   Atrial fibrillation (HCC)   OSA (obstructive sleep apnea)   Type 2 diabetes mellitus (HCC)   AKI (acute kidney injury) (HCC)  Troy Mueller is a 89 y.o. with a history of afib, severe OSA on BiPAP, senile degeneration of the brain on hospice, history of PE/DVT with IVC filter and T2DM, presented with right sided weakness admitted for an acute left pontine  stroke.    #Acute encephalopathy #Acute Left Paramedian Pontine Stroke  Pending PT evaluation. Stable from yesterday.   -PT re-eval  -cw Dysphagia 1 diet  -cw ASA 81 mg daily for 3 months per neuro  -Cw Eliquis  2.5mg  BID   #CKD Stage IIIb #AKI - resolved Cr stable at 1.7 today. CTM.   # Permanent atrial fibrillation Patient is not on rate control home, he does take eliquis  for AC.  -cw Eliquis , 2.5 mg BID   # Severe obstructive sleep apnea Patient has severe OSA and uses home BiPAP at night.  -cw BiPap   # Type 2 diabetes mellitus Fasting glucose 218 this AM. Cw home glipizide , monitor glucose TID.    #Senile Degeneration of the brain . 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 Palliative was consulted for GOC but pt gets care through [facility]. Ongoing GOC discussions should be  ongoing for this patient, who is on hospice but remains full code and would like TPN should he need it understanding that TPN may cause more harm in hospice situations (American Gastro[employer] Association recommendations). He is on a home regimen of seroquel  25 mg nightly.   -can restart Seroquel   -Delirium precautions -continue care through [facility] OP   # History of DVT and PE Has an IVC filter, on eliquis  2.5mg  BID.    #Hypothyroidism  On Synthroid  25 mcg, will continue home Synthroid . Will re-check Thyroid panel today, TSH in October was borderline at 3.8  Code Status: Full code  VTE Prophylaxis:NOAC Diet:Dysphagia 1 Barriers to Discharge: Dispo: Anticipated discharge in approximately 2-3 day(s).   Troy Washington, MD 03/29/2023, 1:56 PM Pager: [phone] After 5pm on weekdays and 1pm on weekends: On Call pager [phone]    Internal Medicine Attending:   I personally saw and examined the patient. Discussed with the resident and agree with the resident's findings and plan  of care as documented in the resident's note. Appreciate neuro f/u Will work with family, caregivers for appropriate placement (SNF/rehab?) to help him maximize his function.  Troy Reyes BROCKS, MD

## 2023-03-29 NOTE — Progress Notes (Signed)
 Per relative request, paged Dr. Volney to inform that relative would like for him to have a CXR or some other test to check for aspiration Pneumonia before he discharges. He is afebrile, no respiratory distress, no SOB, no complaints, on room air, clear to diminished lung sounds throughout and caregiver assisted him to use IS frequently.

## 2023-03-29 NOTE — Progress Notes (Addendum)
 Received page at 3:28pm stating that family was requesting to speak to primary care team. Attempted to call [facility]CPOA to provide updates without success x 2. Nurse mentioned that family was requesting CXR. Patient has remained afebrile, WBC stable. BLE do not look edematous, he is saturating well at RA. Had crackles on lungs yesterday which can be consistent with atelectasis given that patient is in bed now with limited mobility. I do not feel we need a CXR at this time. Spirometry was ordered yesterday. Pt to use spirometer for now. Will continue to monitor and reassess needs.   Troy Haslip Alexander-Savino,MD PGY-1

## 2023-03-29 NOTE — Evaluation (Signed)
 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  Clinical Impression  Met with pt, his daughter [name] and 3 of his caregivers to discuss pt rehab. Pt is able to provide single answer responses to daughters questions and is able to follow commands with Evaluation. Pt's family acknowledges that pt is currently receiving Hospice services, but is interested in possibility of receiving PT/OT services to work on newly onset R sided weakness secondary to acute infarct. Pt baseline level of OOB mobility has been hoyer lift to wheelchair for some time, but he was able to feed himself and utilize Nu-Step to keep LE strength which he is no longer able to perform with his level of R sided weakness. Pt has caregivers and equipment needed for safe discharge home. PT recommending HHPT services to try to regain strength to in turn regain some of the independence he had prior to the stroke.        If plan is discharge home, recommend the following: Two people to help with walking and/or transfers;Two people to help with bathing/dressing/bathroom;Assistance with cooking/housework;Assistance with feeding;Direct supervision/assist for medications management;Direct supervision/assist for financial management;Assist for transportation;Help with stairs or ramp for entrance   Can travel by private vehicle    No    Equipment Recommendations None recommended by PT     Functional Status Assessment Patient has had a recent decline in their functional status and demonstrates the ability to make significant improvements in  function in a reasonable and predictable amount of time.     Precautions / Restrictions Precautions Precautions: Fall Restrictions Weight Bearing Restrictions Per Provider Order: No      Mobility  Bed Mobility Overal bed mobility: Needs Assistance             General bed mobility comments: totalAx2 for rolling R for placement of pillow under L side for neutral alignment    Transfers                   General transfer comment: deferred as hoyer lift at baseline          Modified Rankin (Stroke Patients Only) Modified Rankin (Stroke Patients Only) Pre-Morbid Rankin Score: Severe disability Modified Rankin: Severe disability        Pertinent Vitals/Pain Pain Assessment Pain Assessment: No/denies pain Faces Pain Scale: No hurt Pain Intervention(s): Monitored during session    Home Living Family/patient expects to be discharged to:: Private residence Living Arrangements: Alone Available Help at Discharge: Personal care attendant;Available 24 hours/day Type of Home: House Home Access: Ramped entrance       Home Layout: One level Home Equipment: Wheelchair - manual;Hospital bed;Other (comment) (hoyer lift) Additional Comments: 2 caregivers present to assist with history and setup; they report 16 caregivers in all to provide 24/7 support (1 at night and 2 during the day). pt also with supportive children nearby who are very involved with care    Prior Function Prior Level of Function : Needs assist             Mobility Comments: dependent mobility at baseline; caregivers lift via hoyer to NuStep 1x/day but otherwise  stays in bed. has access to a PT at home if needed when he's agreeable ADLs Comments: bed-level ADLs with assist, typically self feeds with R hand     Extremity/Trunk Assessment   Upper Extremity Assessment Upper Extremity Assessment: Defer to OT evaluation RUE Deficits / Details: 2- shoulder flexion, elbow flexion/extension; 0/5  hand flexion. no pain. RUE Sensation: WNL RUE Coordination: decreased fine motor;decreased gross motor    Lower Extremity Assessment Lower Extremity Assessment: RLE deficits/detail RLE Deficits / Details: PROM WFL, flicker of muscle activation with hip flexion and knee extension, unable to perform meaningful movement LLE Deficits / Details: h/o L foot drop per caregivers       Communication   Communication Communication: Difficulty following commands/understanding;Difficulty communicating thoughts/reduced clarity of speech Following commands: Follows one step commands inconsistently;Follows one step commands with increased time Cueing Techniques: Verbal cues;Tactile cues  Cognition Arousal: Alert Behavior During Therapy: Flat affect Overall Cognitive Status: History of cognitive impairments - at baseline                                 General Comments: follows one step commands, answers limited questions from daughter        General Comments General comments (skin integrity, edema, etc.): 3 caregivers and daughter present throoughout, discussed   new R sided weakness and posibility of getting therapy services when on Hospice        Assessment/Plan    PT Assessment Patient needs continued PT services  PT Problem List Decreased strength;Decreased range of motion;Decreased mobility       PT Treatment Interventions Functional mobility training;Therapeutic activities;Therapeutic exercise;Balance training;Patient/family education;Neuromuscular re-education    PT Goals (Current goals can be found in the Care Plan section)  Acute Rehab PT Goals PT Goal Formulation: With patient/family Time For Goal Achievement: 04/12/23 Potential to Achieve Goals: Fair    Frequency Min 1X/week     Co-evaluation PT/OT/SLP Co-Evaluation/Treatment: Yes Reason for Co-Treatment: For patient/therapist safety;To address functional/ADL transfers;Necessary to address  cognition/behavior during functional activity PT goals addressed during session: Mobility/safety with mobility OT goals addressed during session: ADL's and self-care       AM-PAC PT 6 Clicks Mobility  Outcome Measure Help needed turning from your back to your side while in a flat bed without using bedrails?: Total Help needed moving from lying on your back to sitting on the side of a flat bed without using bedrails?: Total Help needed moving to and from a bed to a chair (including a wheelchair)?: Total Help needed standing up from a chair using your arms (e.g., wheelchair or bedside chair)?: Total Help needed to walk in hospital room?: Total Help needed climbing 3-5 steps with a railing? : Total 6 Click Score: 6    End of Session   Activity Tolerance: Patient tolerated treatment well Patient left: in bed;with call bell/phone within reach;with family/visitor present Nurse Communication: Mobility status PT Visit Diagnosis: Other abnormalities of gait and mobility (R26.89);Other symptoms and signs involving the nervous system (R29.898) Hemiplegia - Right/Left: Right Hemiplegia - dominant/non-dominant: Dominant Hemiplegia - caused by: Cerebral infarction    Time: 8962-8897 PT Time Calculation (min) (ACUTE ONLY): 25 min   Charges:   PT Evaluation $PT Re-evaluation: 1 Re-eval   PT General Charges $$ ACUTE PT VISIT: 1 Visit         Khayden Herzberg B. Fleeta Lapidus PT, DPT Acute Rehabilitation Services Please use secure chat or  Call Office (  [phone]   Almarie KATHEE Salinas Fleet 03/29/2023, 2:04 PM

## 2023-03-30 ENCOUNTER — Inpatient Hospital Stay (HOSPITAL_COMMUNITY)

## 2023-03-30 DIAGNOSIS — I4891 Unspecified atrial fibrillation: Secondary | ICD-10-CM | POA: Diagnosis not present

## 2023-03-30 DIAGNOSIS — I639 Cerebral infarction, unspecified: Secondary | ICD-10-CM | POA: Diagnosis not present

## 2023-03-30 DIAGNOSIS — J9811 Atelectasis: Secondary | ICD-10-CM

## 2023-03-30 DIAGNOSIS — R1310 Dysphagia, unspecified: Secondary | ICD-10-CM

## 2023-03-30 DIAGNOSIS — G4733 Obstructive sleep apnea (adult) (pediatric): Secondary | ICD-10-CM | POA: Diagnosis not present

## 2023-03-30 DIAGNOSIS — E119 Type 2 diabetes mellitus without complications: Secondary | ICD-10-CM | POA: Diagnosis not present

## 2023-03-30 LAB — BASIC METABOLIC PANEL
Anion gap: 8 (ref 5–15)
BUN: 38 mg/dL — ABNORMAL HIGH (ref 8–23)
CO2: 25 mmol/L (ref 22–32)
Calcium: 8.9 mg/dL (ref 8.9–10.3)
Chloride: 105 mmol/L (ref 98–111)
Creatinine, Ser: 1.91 mg/dL — ABNORMAL HIGH (ref 0.61–1.24)
GFR, Estimated: 33 mL/min — ABNORMAL LOW (ref >60)
Glucose, Bld: 186 mg/dL — ABNORMAL HIGH (ref 70–99)
Potassium: 4.5 mmol/L (ref 3.5–5.1)
Sodium: 138 mmol/L (ref 135–145)

## 2023-03-30 LAB — GLUCOSE, CAPILLARY: Glucose-Capillary: 206 mg/dL — ABNORMAL HIGH (ref 70–99)

## 2023-03-30 LAB — TSH: TSH: 3.741 u[IU]/mL (ref 0.350–4.500)

## 2023-03-30 LAB — T4, FREE: Free T4: 0.71 ng/dL (ref 0.61–1.12)

## 2023-03-30 NOTE — Progress Notes (Signed)
 STROKE TEAM PROGRESS NOTE   BRIEF HPI [name] is a 89 y.o. 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  NIH on Admission 13  INTERIM HISTORY/SUBJECTIVE Stroke team was called back to evaluate the patient as there was concern for some mild left-sided facial droop and weakness noted yesterday.  Stat CT scan of the head was obtained last night which showed no acute abnormality.  MRI scanner brain was obtained this morning which showed expected evolutionary changes in the left large paramedian pontine infarct without any new or acute abnormality.  Patient's left facial droop appears to have improved today.  Family also had concern that patient's stroke may have happened since his Eliquis  dose was reduced from 5 mg twice daily to 2.5 mg twice daily by primary care physician.  I reviewed his chart and felt the dose of 2.5 mg twice daily was adequate given his advanced age and low GFR.  I discussed this with patient's daughter over the phone and answered questions and addressed their concerns OBJECTIVE  CBC    Component Value Date/Time   WBC 6.3 03/29/2023 0502   RBC 3.96 (L) 03/29/2023 0502   HGB 12.3 (L) 03/29/2023 0502   HCT 37.3 (L) 03/29/2023 0502   PLT 151 03/29/2023 0502   MCV 94.2 03/29/2023 0502   MCH 31.1 03/29/2023 0502   MCHC 33.0 03/29/2023 0502   RDW 13.7 03/29/2023 0502   LYMPHSABS 1.2 03/26/2023 0650   MONOABS 0.5 03/26/2023 0650   EOSABS 0.2 03/26/2023 0650   BASOSABS 0.0 03/26/2023 0650    BMET    Component Value Date/Time   NA 138 03/30/2023 0540   K 4.5 03/30/2023 0540   CL 105 03/30/2023 0540   CO2 25 03/30/2023 0540   GLUCOSE 186 (H) 03/30/2023 0540   BUN 38 (H) 03/30/2023 0540   CREATININE 1.91 (H) 03/30/2023  0540   CALCIUM  8.9 03/30/2023 0540   GFRNONAA 33 (L) 03/30/2023 0540    IMAGING past 24 hours MR BRAIN WO CONTRAST Result Date: 03/30/2023 CLINICAL DATA:  Neuro deficit, acute, stroke suspected EXAM: MRI HEAD WITHOUT CONTRAST TECHNIQUE: Multiplanar, multiecho pulse sequences of the brain and surrounding structures were obtained without intravenous contrast. COMPARISON:  Head CT 03/29/2023, brain MR 03/26/2023 FINDINGS: Brain: Evolving acute infarct in the left hemi pons. No hemorrhage. No hydrocephalus. No extra-axial fluid collection. No mass effect. No mass lesion. Chronic microhemorrhage in the right parietal lobe, unchanged from prior exam. Unchanged prominent extra-axial spaces along the bilateral frontal convexities, likely secondary to a combination of subdural hygromas and volume loss. There is a background of mild chronic microvascular ischemic change. Vascular: Normal flow voids. Skull and upper cervical spine: Normal marrow signal. Sinuses/Orbits: No middle ear or mastoid effusion. Paranasal sinuses are notable for mucosal thickening in the bilateral ethmoid sinuses. Orbits are unremarkable. Other: None. IMPRESSION: Evolving acute infarct in the left hemi pons. No hemorrhage or mass effect. Electronically Signed   By: Clora Dane M.D.   On: 03/30/2023 11:36   CT HEAD WO CONTRAST ( ) Result Date: 03/29/2023 CLINICAL DATA:  Acute neurologic deficit EXAM: CT HEAD WITHOUT CONTRAST TECHNIQUE: Contiguous axial images were obtained from the base of the skull through the vertex without intravenous contrast. RADIATION DOSE REDUCTION: This exam was performed according to  the departmental dose-optimization program which includes automated exposure control, adjustment of the mA and/or kV according to patient size and/or use of iterative reconstruction technique. COMPARISON:  03/26/2023 FINDINGS: Brain: Markedly enlarged extra-axial CSF spaces. No acute hemorrhage. Focal hypoattenuation at the left pons  consistent with known infarct. Advanced atrophy. Vascular: Atherosclerotic calcification of the vertebral and internal carotid arteries at the skull base. No abnormal hyperdensity of the major intracranial arteries or dural venous sinuses. Skull: The visualized skull base, calvarium and extracranial soft tissues are normal. Sinuses/Orbits: No fluid levels or advanced mucosal thickening of the visualized paranasal sinuses. No mastoid or middle ear effusion. Normal orbits. Other: None. IMPRESSION: 1. No acute intracranial abnormality. 2. Advanced atrophy and chronic small vessel ischemia. 3. Focal hypoattenuation at the left pons consistent with known infarct. Electronically Signed   By: Juanetta Nordmann M.D.   On: 03/29/2023 22:10   DG CHEST PORT 1 VIEW Result Date: 03/29/2023 CLINICAL DATA:  Dyspnea EXAM: PORTABLE CHEST 1 VIEW COMPARISON:  01/12/2023 FINDINGS: Low lung volumes. Cardiomegaly with aortic atherosclerosis. Minimal bibasilar atelectasis. No pleural effusion or pneumothorax IMPRESSION: Low lung volumes with minimal bibasilar atelectasis. Cardiomegaly. Electronically Signed   By: Esmeralda Hedge M.D.   On: 03/29/2023 21:50    Vitals:   03/30/23 0031 03/30/23 0425 03/30/23 0800 03/30/23 1212  BP: 112/61 (!) 112/59 133/65 129/77  Pulse: 73 61 68 77  Resp: 20 15 18    Temp: 98.6 F (37 C) 98.2 F (36.8 C) 98.9 F (37.2 C) 98.4 F (36.9 C)  TempSrc: Oral Oral Axillary Axillary  SpO2: 95% 94%  94%     PHYSICAL EXAM General:  Alert, well-nourished, well-developed elderly Caucasian male in no acute distress Psych:  Mood and affect appropriate for situation CV: Regular rate and rhythm on monitor Respiratory:  Regular, unlabored respirations on room air GI: Abdomen soft and nontender   NEURO:  Mental Status: AA&Ox3,   Speech/Language: speech is severely dysarthric but can be understood with some difficulty.  .  Cranial Nerves:  II: PERRL. Visual fields full.  Mueller, IV, VI: EOMI. Eyelids  elevate symmetrically Left 6th nerve palsy V: Sensation is intact to light touch and symmetrical to face.  VII: Face is asymmetric right VIII: hearing intact to voice. IX, X: Palate elevates symmetrically. Phonation is normal.  BJ:YNWGNFAO shrug 5/5. XII: tongue is deviated to the right Motor:  RUE 1/5 RLE  1/5 LUE 5/5 LLE 2/5 Tone: is normal and bulk is normal Sensation- Intact to light touch bilaterally. Extinction absent to light touch to DSS.   Coordination: FTN intact on the left Gait- deferred  ASSESSMENT/PLAN  Acute Ischemic Infarct:  acute infarct in the left pons  Etiology: small vessel disease in the setting of terminal right dominant vertebral artery stenosis and history of atrial fibrillation on anticoagulation with Eliquis  Code Stroke CT head No acute abnormality. ASPECTS 10.    CTA head & neck - Widespread intracranial and extracranial atherosclerosis, including areas of hemodynamically significant stenosis (perhaps most notably Severe Stenosis of the dominant Right Vertebral V4 segment which is the sole supply to the Basilar). MRI  acute infarct left pons 2D Echo 55-60%, severely dilated LA  LDL 59 HgbA1c 8.1 VTE prophylaxis - Eliquis  Eliquis  (apixaban ) daily prior to admission, now on aspirin  81 mg daily and Eliquis  (apixaban ) daily for 3 months and then Eliquis  alone. Consider adding ASA 81mg  for 3 months given severe stenosis  Therapy recommendations:  Pending Disposition:  pending  Atrial fibrillation Home Meds:  Eliquis  Continue telemetry monitoring  Hypertension Home meds:  norvasc  Stable Blood Pressure Goal: BP less than 220/110   Hyperlipidemia Home meds:  Crestor , resumed in hospital LDL 59, goal < 70 Continue statin at discharge  Diabetes type II Uncontrolled Home meds:  glipizide  HgbA1c 8.1, goal < 7.0 CBGs SSI Recommend close follow-up with PCP for better DM control  Dysphagia Patient has post-stroke dysphagia, SLP consulted    Diet    DIET - DYS 1 Room service appropriate? Yes; Fluid consistency: Pudding Thick  Caregivers report aspiration issues prior to admission Advance diet as tolerated  Other Stroke Risk Factors Obesity, There is no height or weight on file to calculate BMI., BMI >/= 30 associated with increased stroke risk, recommend weight loss, diet and exercise as appropriate   Other Active Problems AKI Patient presented with a serum creatinine of 2.02.  After IV fluids, his creatinine did improve to 1.78.  Will administer additional IV fluids today, I suspect that his AKI is due to dehydration. LR 125cc/hour for 10 hours  Severe obstructive sleep apnea Patient has severe OSA and uses home BiPAP at night. 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  Will hold off on oral medications  Hypothyroidism  On Synthroid  25 mcg, will continue home Synthroid   Hospital day # 4    Patient with chronic atrial fibrillation on Eliquis  presented with sudden onset of dysarthria and right hemiparesis due to left paramedian pontine infarct.  Multiple etiologies possible atrial fibrillation despite anticoagulation versus terminal right vertebral artery symptomatic stenosis as well as small vessel disease.  Recommend continue aspirin  81 mg daily along with Eliquis  2.5 mg twice daily for 3 months and intracranial stenosis and due to increased bleeding risk we will not do triple therapy.  Continue dysphagia  diet and follow-up with speech therapy for swallow difficulty as patient remains high risk for aspiration..  Continue physical occupational and speech therapy.  Aggressive risk factor modification.  Long discussion with patient and with his daughter over the phone and caregivers at the bedside and answered questions.  Discussed with caregivers.  Patient prefers to go home if possible and get therapy at home as  he has excellent care at home.  Discussed with patient and caregivers at bedside.  Stroke team will sign off.  Kindly call for questions greater than 50% time during this 35 minute visit was spent in counseling and coordination of care about his pontine stroke and discussion binetrakin stenosis as well as atrial fibrillation and answering questions.  Ardella Beaver, MD Medical Director [facility] Pager: [phone] 03/30/2023 1:23 PM   To contact Stroke Continuity provider, please refer to [email or web address]. After hours, contact General Neurology

## 2023-03-30 NOTE — Inpatient Diabetes Management (Signed)
 Inpatient Diabetes Program Recommendations  AACE/ADA: New Consensus Statement on Inpatient Glycemic Control (2015)  Target Ranges:  Prepandial:   less than 140 mg/dL      Peak postprandial:   less than 180 mg/dL (1-2 hours)      Critically ill patients:  140 - 180 mg/dL   Lab Results  Component Value Date   GLUCAP 244 (H) 03/29/2023   HGBA1C 8.1 (H) 03/26/2023    Review of Glycemic Control  Latest Reference Range & Units 03/28/23 07:53 03/28/23 15:20 03/28/23 22:09 03/29/23 06:14 03/29/23 13:24 03/29/23 20:40  Glucose-Capillary 70 - 99 mg/dL 308 (H) 657 (H) 846 (H) 145 (H) 218 (H) 244 (H)  (H): Data is abnormally high  Diabetes history: DM 2 Outpatient Diabetes medications: Glipizide  5 mg Daily Current orders for Inpatient glycemic control:  Glipizide  5 mg Daily   A1c 8.1% on 1/11   Inpatient Diabetes Program Recommendations:     -   Add Novolog  0-9 units tid + hs scale while inpatient   Will continue to follow while inpatient.  Thank you, Hays Lipschutz, MSN, CDCES Diabetes Coordinator Inpatient Diabetes Program [phone] (team pager from 8a-5p)

## 2023-03-30 NOTE — TOC Progression Note (Addendum)
 Transition of Care [facility]) - Progression Note    Patient Details  Name: Troy Mueller MRN: [id number] Date of Birth: 1932/08/13  Transition of Care [facility] CM/SW Contact  Jonathan Neighbor, RN Phone Number: 03/30/2023, 2:53 PM  Clinical Narrative:     Ambulance transport for home scheduled for 3 pm tomorrow with [facility] services.  CM is working on arranging more therapy at home above what [facility] can provide. Family is willing to private pay for additional therapies. Caregivers have arranged for PT through [facility] to see the patient a few times through the weekend.  TOC following for further d/c needs.   1511[facility] for private pay therapy at home. They will contact pts caregiver, [name] to arrange timing for therapies at home.  Expected Discharge Plan: Skilled Nursing Facility Barriers to Discharge: Continued Medical Work up  Expected Discharge Plan and Services   Discharge Planning Services: CM Consult   Living arrangements for the past 2 months: Single Family Home                                       Social Determinants of Health (SDOH) Interventions SDOH Screenings   Food Insecurity: No Food Insecurity (03/27/2023)  Housing: Low Risk  (03/27/2023)  Transportation Needs: No Transportation Needs (03/27/2023)  Utilities: Not At Risk (03/27/2023)  Financial Resource Strain: Low Risk  (08/05/2022)   Received from [facility]  Social Connections: Moderately Integrated (03/27/2023)  Tobacco Use: Low Risk  (03/26/2023)    Readmission Risk Interventions     No data to display

## 2023-03-30 NOTE — Progress Notes (Signed)
 Subjective:  Tired. Still weak.   ON:  Concern for bleed, LUE and LLE weakness. Facial droop was present on prior examinations. CT cw prior stroke. MRI to be done this AM.   Objective:  Vital signs in last 24 hours: Vitals:   03/30/23 0031 03/30/23 0425 03/30/23 0800 03/30/23 1212  BP: 112/61 (!) 112/59 133/65 129/77  Pulse: 73 61 68 77  Resp: 20 15 18    Temp: 98.6 F (37 C) 98.2 F (36.8 C) 98.9 F (37.2 C) 98.4 F (36.9 C)  TempSrc: Oral Oral Axillary Axillary  SpO2: 95% 94%  94%   Physical Exam: General: No acute distress, laying in bed Cardiac: Irregularly irregular rhythm, regular rate, no murmurs appreciated Pulmonary: Normal effort on room air, clear to auscultation bilaterally, saturating well at RA, no accessory muscle use. -left facial droop. -right upper and lower extremities 0/5 strength. 5/5 strength on the left upper extremity. 1/5 strength on the LLE.  -Tone and bulk normal Skin: Warm and dry     Latest Ref Rng & Units 03/29/2023    5:02 AM 03/28/2023    5:53 AM 03/27/2023    6:29 AM  CBC  WBC 4.0 - 10.5 K/uL 6.3  6.8  5.6   Hemoglobin 13.0 - 17.0 g/dL 16.1  09.6  04.5   Hematocrit 39.0 - 52.0 % 37.3  39.2  38.4   Platelets 150 - 400 K/uL 151  164  175        Latest Ref Rng & Units 03/30/2023    5:40 AM 03/29/2023    5:02 AM 03/28/2023    5:53 AM  BMP  Glucose 70 - 99 mg/dL 409  811  914   BUN 8 - 23 mg/dL 38  27  23   Creatinine 0.61 - 1.24 mg/dL 7.82  9.56  2.13   Sodium 135 - 145 mmol/L 138  137  136   Potassium 3.5 - 5.1 mmol/L 4.5  4.6  4.6   Chloride 98 - 111 mmol/L 105  104  104   CO2 22 - 32 mmol/L 25  23  23    Calcium  8.9 - 10.3 mg/dL 8.9  9.4  9.6     Assessment/Plan:  Principal Problem:   Acute cerebral infarction (HCC) Active Problems:   Atrial fibrillation (HCC)   OSA (obstructive sleep apnea)   Type 2 diabetes mellitus (HCC)   AKI (acute kidney injury) (HCC)  Troy Mueller is a 89 y.o. 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    #Acute encephalopathy #Acute Left Paramedian Pontine Stroke  Post-stroke pt has a left sided facial droop, right sided upper and lower extremity weakness. More weakness on the left lower extremity today. CT and MRI not concerning for a new stroke. Likely due to deconditioning. HH PT recommended. Discharge tomorrow, scheduled transportation for 3pm.   -cw Dysphagia 1 diet  -cw ASA 81 mg daily for 3 months per neuro  -Cw Eliquis  2.5mg  BID  -PT/OT  #Atelectasis Had crackles on exam, afebrile, WBC WNL. Has increased sputum, could be related to increased spirometry. He is at risk for aspiration. Swallow eval recommended a Dysphagia 1 diet at admission.   -cw aspiration precautions  -cw spirometry  -ctm fever curve  -supportive care for secretions  #CKD Stage IIIb #AKI - resolved  Cr stable at 1.9 today. CTM  # Permanent atrial fibrillation Patient is not on rate control home, he does take eliquis  for AC.  -cw Eliquis , 2.5 mg BID   # Severe obstructive sleep apnea Patient has severe OSA and uses home BiPAP at night.  -cw BiPap   # Type 2 diabetes mellitus Fasting glucose 186 this AM. Cw home glipizide , monitor glucose TID.    #Senile Degeneration of the brain . 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 Palliative was consulted for GOC but pt gets care through [facility]. Ongoing GOC discussions should be ongoing for this patient, who is on hospice but remains full code and would like TPN should he need it understanding that TPN may cause more harm in hospice situations ([employer] recommendations). Family also asked about cortrak. Pt is able to tolerate Dysphagia 1 diet. Would  recommend him eat naturally vs artificial while he can. He is on a home regimen of seroquel  25 mg nightly.   -can restart Seroquel   -Delirium precautions -continue care through [facility] OP   # History of DVT and PE Has an IVC filter, on eliquis  2.5mg  BID.    #Hypothyroidism  On Synthroid  25 mcg, will continue home Synthroid . Will re-check Thyroid panel today, TSH in October was borderline at 3.8  Code Status: Full code  VTE Prophylaxis:NOAC Diet:Dysphagia 1 Barriers to Discharge: Dispo: Anticipated discharge in approximately 2-3 day(s).   Troy Manzanilla Washington, MD 03/30/2023, 1:13 PM Pager: [phone] After 5pm on weekdays and 1pm on weekends: On Call pager [phone]    Internal Medicine Attending:   I personally saw and examined the patient. Discussed with the resident and agree with the resident's findings and plan of care as documented in the resident's note. Appreciate neuro f/u Will work with family, caregivers for appropriate placement (SNF/rehab?) to help him maximize his function.  Sandie Cross, MD

## 2023-03-30 NOTE — Progress Notes (Signed)
 Speech Language Pathology Treatment: Dysphagia  Patient Details Name: Troy Mueller MRN: [id number] DOB: 1932/04/12 Today's Date: 03/30/2023 Time: 1520-1540 SLP Time Calculation (min) (ACUTE ONLY): 20 min  Assessment / Plan / Recommendation Clinical Impression  Patient seen by SLP for skilled treatment session focused on dysphagia goals. Two of his caregivers were present in the room. SLP provided them with sample box of Simply Thick thickener which they intend to order for patient. Plan is for discharge home tomorrow around 3pm with hospice, caregivers, and [facility] PT/OT/SLP. His caregivers report that today and yesterday, he has been more weak overall. SLP discussed with patient and caregivers regarding thickened liquids pros and cons and all in agreement for repeat MBS next date to reassess prior to discharge home. SLP recommended that caregivers and patient discuss with their future HH SLP, not only swallow safety and precautions but also quality of life and maximizing PO intake and considering the risks of dehydration and malnutrition, loss of pleasure with PO intake as well as risk of aspiration.   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 He was kept NPO after failing Yale swallow with RN. 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 He was seen by SLP at [facility] during an admission in late October of 2024 with evaluation and treatment for dysphagia as well as speech/voice.  He is actively on hospice. Bedside swallow evaluation completed 03/26/23 and recommended full liquids/pudding thick and to proceed with MBS. Patient declined to have MBS on 1/11 when SLP evaluating but RN secure messaged SLP in early evening and patient had then agreed to do  MBS.      SLP Plan  Continue with current plan of care      Recommendations for follow up therapy are one component of a multi-disciplinary discharge planning process, led by the attending physician.  Recommendations may be updated based on patient status, additional functional criteria and insurance authorization.    Recommendations  Diet recommendations: Dysphagia 1 (puree);Pudding-thick liquid Liquids provided via: Cup;Teaspoon Medication Administration: Crushed with puree Supervision: Staff to assist with self feeding;Full supervision/cueing for compensatory strategies Compensations: Slow rate;Small sips/bites Postural Changes and/or Swallow Maneuvers: Seated upright 90 degrees                  Oral care QID;Oral care before and after PO   Frequent or constant Supervision/Assistance Dysphagia, unspecified (R13.10)     Continue with current plan of care     Jacqualine Mater, MA, CCC-SLP Speech Therapy

## 2023-03-30 NOTE — Plan of Care (Signed)
 Eating more and tolerated feeding today. He has less secretions as compared in the morning. Follow commands, calm, cooperative when awake.  Problem: Education: Goal: Knowledge of General Education information will improve Description: Including pain rating scale, medication(s)/side effects and non-pharmacologic comfort measures Outcome: Progressing   Problem: Activity: Goal: Risk for activity intolerance will decrease Outcome: Progressing   Problem: Safety: Goal: Ability to remain free from injury will improve Outcome: Progressing   Problem: Skin Integrity: Goal: Risk for impaired skin integrity will decrease Outcome: Progressing   Problem: Ischemic Stroke/TIA Tissue Perfusion: Goal: Complications of ischemic stroke/TIA will be minimized Outcome: Progressing   Problem: Nutrition: Goal: Risk of aspiration will decrease Outcome: Progressing

## 2023-03-30 NOTE — Progress Notes (Signed)
 [facility] Hospitalized Hospice Patient   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  Patient was admitted to [facility] on 1.11.25 with diagnosis of Encepholophy.  Per Dr. Laird Pih, with [facility].  This is a related hospital admission.   Visited with patient and family at bedside. Patient is lethargic today with increased audible congestion with coughing. Has been evaluated for new overnight symptoms for new CVA with new facial droop that has since resolved. Workup is negative for new CVA so far. Discussed plan with family as well as TOC for discharge to home with Hospice services, PT/OT/ST provided as much as possible with Hospice services with additional PT/OT/ST being provided by private pay by family. Also discussed concerns with TOC related to respiratory congestion.    Patient is appropriate for GIP to monitor new side effect of stroke requiring close monitoring by skilled personnel with frequent skilled assessment following left pons infarct as well as further evaluation of new onset of symptoms concerning for additional cerebrovascular event.   Vital Signs:  98.9/68/18   133/65   O2 94% on bipap   Abnormal Labs:  BUN 38, Cre 1.9, GFRe 33   Diagnostic Imaging:  MRI brain: IMPRESSION: Evolving acute infarct in the left hemi pons. No hemorrhage or mass effect.  CT head without contrast: IMPRESSION: 1. No acute intracranial abnormality. 2. Advanced atrophy and chronic small vessel ischemia. 3. Focal hypoattenuation at the left pons consistent with known infarct.  Portable Chest Xray: IMPRESSION: Low lung volumes with minimal bibasilar atelectasis. Cardiomegaly.   IV/PRN Medications: ambien  5mg  po x 1  Assessment/plan from note 1.14 #Acute encephalopathy #Acute Left Paramedian Pontine Stroke  Pending PT evaluation. Stable from yesterday.    -PT re-eval  -cw  Dysphagia 1 diet  -cw ASA 81 mg daily for 3 months per neuro  -Cw Eliquis  2.5mg  BID      # Severe obstructive sleep apnea Patient has severe OSA and uses home BiPAP at night.  -cw BiPap    #Senile Degeneration of the brain . 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 Palliative was consulted for GOC but pt gets care through [facility]. Ongoing GOC discussions should be ongoing for this patient, who is on hospice but remains full code and would like TPN should he need it understanding that TPN may cause more harm in hospice situations (American Gastroenter[employer] Association recommendations). He is on a home regimen of seroquel  25 mg nightly.    -can restart Seroquel   -Delirium precautions -continue care through [facility] OP   Discharge Plan:  reviewed options for discharge and at this time plan is for discharge home    IDT:  updated   Family Contact:  spoke with daughter/HCPOA Troy Mueller at bedside   Goals of Care:  Clear, patient is a full code per his request.   Please do not hesitate to call with any hospice related questions or concerns.   Troy Mueller, BSN, RN, [facility] [phone]

## 2023-03-31 ENCOUNTER — Inpatient Hospital Stay (HOSPITAL_COMMUNITY)

## 2023-03-31 ENCOUNTER — Other Ambulatory Visit (HOSPITAL_COMMUNITY): Payer: Self-pay

## 2023-03-31 DIAGNOSIS — G311 Senile degeneration of brain, not elsewhere classified: Secondary | ICD-10-CM | POA: Diagnosis not present

## 2023-03-31 DIAGNOSIS — I639 Cerebral infarction, unspecified: Secondary | ICD-10-CM | POA: Diagnosis not present

## 2023-03-31 DIAGNOSIS — E119 Type 2 diabetes mellitus without complications: Secondary | ICD-10-CM | POA: Diagnosis not present

## 2023-03-31 DIAGNOSIS — I4891 Unspecified atrial fibrillation: Secondary | ICD-10-CM | POA: Diagnosis not present

## 2023-03-31 LAB — GLUCOSE, CAPILLARY: Glucose-Capillary: 155 mg/dL — ABNORMAL HIGH (ref 70–99)

## 2023-03-31 MED ORDER — POLYETHYLENE GLYCOL 3350 17 GM/SCOOP PO POWD
17.0000 g | Freq: Every day | ORAL | 0 refills | Status: DC
  Filled 2023-03-31: qty 238, 14d supply, fill #0

## 2023-03-31 MED ORDER — FOOD THICKENER (SIMPLYTHICK HONEY)
2.0000 | ORAL | 0 refills | Status: AC | PRN
  Filled 2023-03-31: qty 180, fill #0

## 2023-03-31 MED ORDER — ASPIRIN 81 MG PO CHEW
81.0000 mg | CHEWABLE_TABLET | Freq: Every day | ORAL | 0 refills | Status: AC
  Filled 2023-03-31: qty 30, 30d supply, fill #0

## 2023-03-31 MED ORDER — POLYVINYL ALCOHOL 1.4 % OP SOLN
1.0000 [drp] | OPHTHALMIC | 0 refills | Status: AC | PRN
  Filled 2023-03-31: qty 15, fill #0

## 2023-03-31 MED ORDER — GLIPIZIDE ER 5 MG PO TB24
5.0000 mg | ORAL_TABLET | Freq: Every day | ORAL | 0 refills | Status: DC
  Filled 2023-03-31: qty 30, 30d supply, fill #0

## 2023-03-31 MED ORDER — SCOPOLAMINE 1 MG/3DAYS TD PT72
1.0000 | MEDICATED_PATCH | TRANSDERMAL | 0 refills | Status: AC
  Filled 2023-03-31: qty 10, 30d supply, fill #0

## 2023-03-31 MED ORDER — ENSURE ENLIVE PO LIQD
237.0000 mL | Freq: Two times a day (BID) | ORAL | 12 refills | Status: AC
  Filled 2023-03-31: qty 237, 1d supply, fill #0

## 2023-03-31 MED ORDER — CARMEX CLASSIC LIP BALM EX OINT
1.0000 | TOPICAL_OINTMENT | CUTANEOUS | 0 refills | Status: AC | PRN
  Filled 2023-03-31: qty 10, fill #0

## 2023-03-31 MED ORDER — SCOPOLAMINE 1 MG/3DAYS TD PT72
1.0000 | MEDICATED_PATCH | TRANSDERMAL | Status: DC
  Administered 2023-03-31: 1.5 mg via TRANSDERMAL
  Filled 2023-03-31: qty 1

## 2023-03-31 MED ORDER — APIXABAN 2.5 MG PO TABS
2.5000 mg | ORAL_TABLET | Freq: Two times a day (BID) | ORAL | 0 refills | Status: AC
  Filled 2023-03-31: qty 60, 30d supply, fill #0

## 2023-03-31 NOTE — Plan of Care (Signed)
  Problem: Education: Goal: Knowledge of General Education information will improve Description: Including pain rating scale, medication(s)/side effects and non-pharmacologic comfort measures Outcome: Progressing   Problem: Health Behavior/Discharge Planning: Goal: Ability to manage health-related needs will improve Outcome: Progressing   Problem: Clinical Measurements: Goal: Ability to maintain clinical measurements within normal limits will improve Outcome: Progressing Goal: Will remain free from infection Outcome: Progressing Goal: Diagnostic test results will improve Outcome: Progressing Goal: Respiratory complications will improve Outcome: Progressing Goal: Cardiovascular complication will be avoided Outcome: Progressing   Problem: Activity: Goal: Risk for activity intolerance will decrease Outcome: Progressing   Problem: Nutrition: Goal: Adequate nutrition will be maintained Outcome: Progressing   Problem: Coping: Goal: Level of anxiety will decrease Outcome: Progressing   Problem: Safety: Goal: Ability to remain free from injury will improve Outcome: Progressing   Problem: Education: Goal: Knowledge of disease or condition will improve Outcome: Progressing Goal: Knowledge of secondary prevention will improve (MUST DOCUMENT ALL) Outcome: Progressing Goal: Knowledge of patient specific risk factors will improve Loraine Leriche N/A or DELETE if not current risk factor) Outcome: Progressing   Problem: Ischemic Stroke/TIA Tissue Perfusion: Goal: Complications of ischemic stroke/TIA will be minimized Outcome: Progressing

## 2023-03-31 NOTE — Procedures (Signed)
Modified Barium Swallow Study  Patient Details  Name: Troy Mueller MRN: [id number] Date of Birth: September 05, 1932  Today's Date: 03/31/2023  Modified Barium Swallow completed.  Full report located under Chart Review in the Imaging Section.  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 He was kept NPO after failing Yale swallow with RN. 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 He was seen by SLP at [facility] during an admission in late October of 2024 with evaluation and treatment for dysphagia as well as speech/voice.  He is actively on hospice. Bedside swallow evaluation completed 03/26/23 and recommended full liquids/pudding thick and to proceed with MBS. Initial MBS completed on 03/27/2023 recommending dys 1 puree solids and pudding thick liquids. Repeat MBS indicated to reassess swallow function prior to anticipated discharge home 03/31/23.   Clinical Impression Patient presents with an improved swallow function as compared to MBS completed on 03/27/23. During today's MBS, he continues to exhibit swallow initiation delays to pyriform sinus with thin and nectar thick liquids, to the posterior laryngeal surface of the epiglottis with honey thick liquids and to the vallecular sinus with puree solids. With spoon sips of nectar thick liquids and honey thick liquids, no penetration or aspiration observed across multiple trials. He did have consistent flash penetration (PAS 2) with thin liquids via spoon sip. With small cup sips of nectar thick liquids and thin liquids, he exhibited silent aspiration. As patient's main functional impairment is delayed initiation of swallowing and delayed laryngeal vestibular closure, SLP expects that his  swallow function will vary with his level of alertness and strength and therefore when he is fatigued, he will be at higher risk of aspiration. SLP is recommending Dys 1 (Puree solids), honey thick liquids via spoon sip. Factors that may increase risk of adverse event in presence of aspiration Rubye Oaks & Clearance Coots 2021): Poor general health and/or compromised immunity;Weak cough;Limited mobility;Frequent aspiration of large volumes;Dependence for feeding and/or oral hygiene;Frail or deconditioned  Swallow Evaluation Recommendations Recommendations: PO diet PO Diet Recommendation: Dysphagia 1 (Pureed);Moderately thick liquids (Level 3, honey thick) Liquid Administration via: Spoon Medication Administration: Crushed with puree Supervision: Full supervision/cueing for swallowing strategies;Full assist for feeding Swallowing strategies  : Slow rate;Small bites/sips Postural changes: Position pt fully upright for meals;Stay upright 30-60 min after meals Oral care recommendations: Oral care BID (2x/day);Staff/trained caregiver to provide oral care Caregiver Recommendations: Have oral suction available;Avoid jello, ice cream, thin soups, popsicles  Angela Nevin, MA, CCC-SLP Speech Therapy

## 2023-03-31 NOTE — Discharge Instructions (Addendum)
Dr. Lowell Guitar,   Thank you for allowing [place] to take care of you while you were in the hospital. You presented with weakness on your right side and were admitted for a left paramedian pontine stroke that was found on MRI.   Neurology was consulted and you were started on the following medications:   Aspirin 81 mg that you need to take for 3 months (April 11th 2025) along with Eliquis 2.5mg  twice a day. After April 11th, you will need to only take Eliquis.   Please continue taking your rosuvastatin, fenofibrate, and ezetimibe.   Your speech and swallowing capabilities were affected as well. Speech evaluated you and made the following recommendations:   Diet recommendations: Dysphagia 1 (puree);Honey-thick liquid Liquids provided via: Teaspoon Medication Administration: Crushed with puree Supervision: Staff to assist with self feeding;Full supervision/cueing for compensatory strategies Compensations: Slow rate;Small sips/bites Postural Changes and/or Swallow Maneuvers: Seated upright 90 degrees  Because your swallowing was affected you are at risk of aspiration which can predispose you to pneumonia. You did have increased lung sounds during your admission but no signs of an infection. You started spirometry and had increased secretions after that which improved on the day of your discharge. Please continue with aspiration precautions (eating slowly and following instructions provided by speech) and monitor for a fever, chills, malaise.  Please follow closely with your primary care doctor as your speech and swallowing will continue to evolve. Continue with spirometry and use the scopolamine patch that we have prescribed for you.  Some of your weakness could be related to your immobility and deconditioning. Your weakness caused by the stroke should also improve with more movement. Please continue working with PT and OT at home.   Please follow up with your primary care doctor within a week of your  hospitalization.   Thank you again for allowing [place] to take care of your during your stay.   Sincerely,  Manuela Neptune, MD  PGY-1

## 2023-03-31 NOTE — Progress Notes (Signed)
Nursing Discharge Note   Name: Troy Mueller MRN: [id number] DOB: 1932/04/17    Admit Date:  03/26/2023  Discharge Date:  03/31/2023   Troy Mueller is to be discharged home per MD order.  AVS completed. Reviewed with patient and family at bedside. Highlighted copy provided for patient to take home.  Patient/caregiver able to verbalize understanding of discharge instructions. PIV removed. Patient stable upon discharge.  Discharging home by ambulance with [facility].  AVS provided to ambulance company in addition to one being sent with family/ caregivers at home.     Discharge Instructions      Dr. Lowell Guitar,   Thank you for allowing [place] to take care of you while you were in the hospital. You presented with weakness on your right side and were admitted for a left paramedian pontine stroke that was found on MRI.   Neurology was consulted and you were started on the following medications:   Aspirin 81 mg that you need to take for 3 months (April 11th 2025) along with Eliquis 2.5mg  twice a day. After April 11th, you will need to only take Eliquis.   Please continue taking your rosuvastatin, fenofibrate, and ezetimibe.   Your speech and swallowing capabilities were affected as well. Speech evaluated you and made the following recommendations:   Diet recommendations: Dysphagia 1 (puree);Honey-thick liquid Liquids provided via: Teaspoon Medication Administration: Crushed with puree Supervision: Staff to assist with self feeding;Full supervision/cueing for compensatory strategies Compensations: Slow rate;Small sips/bites Postural Changes and/or Swallow Maneuvers: Seated upright 90 degrees  Because your swallowing was affected you are at risk of aspiration which can predispose you to pneumonia. You did have increased lung sounds during your admission but no signs of an infection. You started spirometry and had increased secretions after that which improved on the day of your  discharge. Please continue with aspiration precautions (eating slowly and following instructions provided by speech) and monitor for a fever, chills, malaise.  Please follow closely with your primary care doctor as your speech and swallowing will continue to evolve. Continue with spirometry and use the scopolamine patch that we have prescribed for you.  Some of your weakness could be related to your immobility and deconditioning. Your weakness caused by the stroke should also improve with more movement. Please continue working with PT and OT at home.   Please follow up with your primary care doctor within a week of your hospitalization.   Thank you again for allowing [place] to take care of your during your stay.   Sincerely,  Manuela Neptune, MD  PGY-1     Discharge Instructions     Discharge instructions   Complete by: As directed    Dr. Lowell Guitar,   Thank you for allowing [place] to take care of you while you were in the hospital. You presented with weakness on your right side and were admitted for a left paramedian pontine stroke that was found on MRI.   Neurology was consulted and you were started on the following medications:   Aspirin 81 mg that you need to take for 3 months (April 11th 2025) along with Eliquis 2.5mg  twice a day. After April 11th, you will need to only take Eliquis.   Please continue taking your rosuvastatin, fenofibrate, and ezetimibe.   Your speech and swallowing capabilities were affected as well. Speech evaluated you and made the following recommendations:   Diet recommendations: Dysphagia 1 (puree);Honey-thick liquid Liquids provided via: Teaspoon Medication Administration: Crushed with puree  Supervision: Staff to assist with self feeding;Full supervision/cueing for compensatory strategies Compensations: Slow rate;Small sips/bites Postural Changes and/or Swallow Maneuvers: Seated upright 90 degrees  Because your swallowing was affected you are at risk of  aspiration which can predispose you to pneumonia. You did have increased lung sounds during your admission but no signs of an infection. You started spirometry and had increased secretions after that which improved on the day of your discharge. Please continue with aspiration precautions (eating slowly, following instructions provided by speech and monitor for a fever, chills, malaise.  Please follow closely with your primary care doctor as your speech and swallowing will continue to evolve. Continue with spirometry and use the scopolamine patch that we have prescribed for you.  Some of your weakness could be related to your immobility and deconditioning. Your weakness caused by the stroke should also improve with more movement. Please continue working with PT and OT at home.   Please follow up with your primary care doctor within a week of your hospitalization.   Thank you again for allowing [place] to take care of your during your stay.   Sincerely,  Manuela Neptune, MD  PGY-1   Discharge wound care:   Complete by: As directed    Please continue to offload pressure by changing positions regularly and continue previous wound care by the health care team.   Increase activity slowly   Complete by: As directed        Allergies as of 03/31/2023       Reactions   Tylenol [acetaminophen]    Rash        Medication List     STOP taking these medications    Ambien 10 MG tablet Generic drug: zolpidem   BC HEADACHE POWDER PO       TAKE these medications    amLODipine 5 MG tablet Commonly known as: NORVASC Take 5 mg by mouth daily.   Aspirin Low Dose 81 MG chewable tablet Generic drug: aspirin Chew 1 tablet (81 mg total) by mouth daily. Take until 06/24/2022.   Eliquis 2.5 MG Tabs tablet Generic drug: apixaban Take 1 tablet (2.5 mg total) by mouth 2 (two) times daily. What changed:  medication strength how much to take additional instructions   ezetimibe 10 MG  tablet Commonly known as: ZETIA Take 10 mg by mouth daily.   feeding supplement Liqd Take 237 mLs by mouth 2 (two) times daily between meals.   fenofibrate 54 MG tablet Take 54 mg by mouth daily.   food thickener Liqd Commonly known as: SIMPLYTHICK (HONEY/LEVEL 3/MODERATELY THICK) Take 2 packets by mouth as needed to make extremely thick pudding   glipiZIDE 5 MG 24 hr tablet Commonly known as: GLUCOTROL XL Take 1 tablet (5 mg total) by mouth daily with breakfast. What changed: when to take this   ipratropium-albuterol 0.5-2.5 (3) MG/3ML Soln Commonly known as: DUONEB Take 3 mLs by nebulization every 4 (four) hours as needed.   levothyroxine 25 MCG tablet Commonly known as: SYNTHROID Take 25 mcg by mouth every morning.   lip balm ointment Apply 1 Application topically as needed (cold sores).   omega-3 acid ethyl esters 1 g capsule Commonly known as: LOVAZA Take 2 capsules by mouth 2 (two) times daily.   polyethylene glycol powder 17 GM/SCOOP powder Commonly known as: GLYCOLAX/MIRALAX Mix as directed and take 17 g by mouth daily.   polyvinyl alcohol 1.4 % ophthalmic solution Commonly known as: LIQUIFILM TEARS Place 1 drop into both  eyes as needed for dry eyes.   PreviDent 5000 Plus 1.1 % Crea dental cream Generic drug: sodium fluoride Place 1 Application onto teeth daily.   QUEtiapine 100 MG tablet Commonly known as: SEROQUEL Take 100 mg by mouth at bedtime.   rosuvastatin 10 MG tablet Commonly known as: CRESTOR Take 10 mg by mouth daily.   scopolamine 1 MG/3DAYS Commonly known as: TRANSDERM-SCOP Place 1 patch (1.5 mg total) onto the skin every 3 (three) days.               Discharge Care Instructions  (From admission, onward)           Start     Ordered   03/31/23 0000  Discharge wound care:       Comments: Please continue to offload pressure by changing positions regularly and continue previous wound care by the health care team.   03/31/23  1409            Discharge Instructions/ Education: An After Visit Summary was printed and given to the patient. Discharge instructions given to patient/family with verbalized understanding. Discharge education completed with patient/family including: follow up instructions, medication list, discharge activities, and limitations if indicated.  Additional discharge instructions as indicated by discharging provider also reviewed.  Patient and family able to verbalize understanding, all questions fully answered. Patient instructed to return to Emergency Department, call 911, or call MD for any changes in condition.   [facility] is to provide transportation to facility for patient. Non-emergency ambulance transport at bedside. Handoff completed with [facility] staff/EMTs.   Patient discharged from hospital unit via stretcher. Stable at time of discharge.

## 2023-03-31 NOTE — Progress Notes (Signed)
Speech Language Pathology Treatment: Dysphagia  Patient Details Name: KEYVIN EAVEY MRN: [id number] DOB: [birth year] Today's Date: 03/31/2023 Time: 1120-1130 SLP Time Calculation (min) (ACUTE ONLY): 10 min  Assessment / Plan / Recommendation Clinical Impression  After MBS, SLP  discussed results, recommendations, safety precautions with two of his caregivers and two of his family members in the presence of patient who was in the room. SLP discussed the improvements noted in repeat MBS and recommendation to advance liquids to honey thick via spoon sip only. SLP also discussed recommendation to allow patient to have spoon sips of nectar thick liquids and even thin liquids when he is adequately awake, alert and not fatigued. SLP discussed that expectation is that patient's aspiration risk will be greater when he is fatigued/drowsy, etc and that advanced PO's (nectar thick, thin liquids) should not be given at those times. Caregivers report that prior to this admission, they were closely monitoring his level of alertness related to PO intake. SLP provided IDDSI (international dysphagia diet standardization initiative) handouts and educated them regarding how to test for liquid viscosities. SLP defers trials of advanced solids to [facility] SLP. All questions answered to satisfaction of family and caregivers and they have all demonstrated understanding of precautions and swallow safety.     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 He was kept NPO after failing Yale swallow with RN. 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 He was seen by SLP at [facility] during an admission in late October of 2024 with evaluation and treatment for dysphagia as well as  speech/voice.  He is actively on hospice. Bedside swallow evaluation completed 03/26/23 and recommended full liquids/pudding thick and to proceed with MBS. Initial MBS completed on 03/27/2023 recommending dys 1 puree solids and pudding thick liquids. Repeat MBS indicated to reassess swallow function prior to anticipated discharge home 03/31/23.      SLP Plan  Continue with current plan of care      Recommendations for follow up therapy are one component of a multi-disciplinary discharge planning process, led by the attending physician.  Recommendations may be updated based on patient status, additional functional criteria and insurance authorization.    Recommendations  Diet recommendations: Dysphagia 1 (puree);Honey-thick liquid Liquids provided via: Teaspoon Medication Administration: Crushed with puree Supervision: Staff to assist with self feeding;Full supervision/cueing for compensatory strategies Compensations: Slow rate;Small sips/bites Postural Changes and/or Swallow Maneuvers: Seated upright 90 degrees                  Oral care QID;Oral care before and after PO   Frequent or constant Supervision/Assistance Dysphagia, oropharyngeal phase (R13.12)     Continue with current plan of care    Angela Nevin, MA, CCC-SLP Speech Therapy

## 2023-03-31 NOTE — Discharge Summary (Signed)
Name: Troy Mueller MRN: [id number] DOB: 27-May-1932 89 y.o. PCP: Kandyce Rud, MD  Date of Admission: 03/26/2023  6:46 AM Date of Discharge:  03/31/2023 Attending Physician: Dr.  Ninetta Lights  DISCHARGE DIAGNOSIS:  Primary Problem: Acute cerebral infarction [facility])   Hospital Problems: Principal Problem:   Acute cerebral infarction [facility]) Active Problems:   Atrial fibrillation (HCC)   OSA (obstructive sleep apnea)   Type 2 diabetes mellitus (HCC)   AKI (acute kidney injury) (HCC)   Dysphagia    DISCHARGE MEDICATIONS:   Allergies as of 03/31/2023       Reactions   Tylenol [acetaminophen]    Rash        Medication List     STOP taking these medications    Ambien 10 MG tablet Generic drug: zolpidem   BC HEADACHE POWDER PO       TAKE these medications    amLODipine 5 MG tablet Commonly known as: NORVASC Take 5 mg by mouth daily.   Aspirin Low Dose 81 MG chewable tablet Generic drug: aspirin Chew 1 tablet (81 mg total) by mouth daily. Take until 06/24/2022.   Eliquis 2.5 MG Tabs tablet Generic drug: apixaban Take 1 tablet (2.5 mg total) by mouth 2 (two) times daily. What changed:  medication strength how much to take additional instructions   ezetimibe 10 MG tablet Commonly known as: ZETIA Take 10 mg by mouth daily.   feeding supplement Liqd Take 237 mLs by mouth 2 (two) times daily between meals.   fenofibrate 54 MG tablet Take 54 mg by mouth daily.   food thickener Liqd Commonly known as: SIMPLYTHICK (HONEY/LEVEL 3/MODERATELY THICK) Take 2 packets by mouth as needed to make extremely thick pudding   glipiZIDE 5 MG 24 hr tablet Commonly known as: GLUCOTROL XL Take 1 tablet (5 mg total) by mouth daily with breakfast. What changed: when to take this   ipratropium-albuterol 0.5-2.5 (3) MG/3ML Soln Commonly known as: DUONEB Take 3 mLs by nebulization every 4 (four) hours as needed.   levothyroxine 25 MCG tablet Commonly known as:  SYNTHROID Take 25 mcg by mouth every morning.   lip balm ointment Apply 1 Application topically as needed (cold sores).   omega-3 acid ethyl esters 1 g capsule Commonly known as: LOVAZA Take 2 capsules by mouth 2 (two) times daily.   polyethylene glycol powder 17 GM/SCOOP powder Commonly known as: GLYCOLAX/MIRALAX Mix as directed and take 17 g by mouth daily.   polyvinyl alcohol 1.4 % ophthalmic solution Commonly known as: LIQUIFILM TEARS Place 1 drop into both eyes as needed for dry eyes.   PreviDent 5000 Plus 1.1 % Crea dental cream Generic drug: sodium fluoride Place 1 Application onto teeth daily.   QUEtiapine 100 MG tablet Commonly known as: SEROQUEL Take 100 mg by mouth at bedtime.   rosuvastatin 10 MG tablet Commonly known as: CRESTOR Take 10 mg by mouth daily.   scopolamine 1 MG/3DAYS Commonly known as: TRANSDERM-SCOP Place 1 patch (1.5 mg total) onto the skin every 3 (three) days.               Discharge Care Instructions  (From admission, onward)           Start     Ordered   03/31/23 0000  Discharge wound care:       Comments: Please continue to offload pressure by changing positions regularly and continue previous wound care by the health care team.   03/31/23 1409  DISPOSITION AND FOLLOW-UP:  Troy Mueller was discharged from [facility] in [place] condition. At the hospital follow up visit please address:  Follow-up Recommendations: Consults: SLP Labs: Basic Metabolic Profile and CBC Studies: Continous SLP eval, monitor for fevers Medications: Scopolamine, Aspirin, Eliquis, Lipid lowering medication  Follow-up Appointments:  Follow-up Information     [facility] Follow up.   Why: [facility] home [facility] will work out times for therapies to work with the patient Contact information: [address] [place] [address] [phone]                Follow up with  PCP in 1 week.   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 He was encephalopathic. A code stroke was called, CT noncontrast and CTA did not reveal any acute infarct. MRI showed a left paramedian pontine stroke. He does have significant stenosis of the Right vertebral V4 segment which is the sole supply to the Basilar. LDL 59 Hgb A1c 8.1 Echo EF 55-60% with severely dilated LA Less likely to be infectious causes, patient was afebrile and does not have a leukocytosis.  Less likely to be due to hypercapnia, CO2 on a 43.9.  Less likely to be due to liver impairment, or uremia; AST and ALT are within normal limits and BUN is 35.  Ethanol level WNL.   Neurology was consulted who recommended to start Aspirin 81mg  and Eliquis daily for at three months and then Eliquis alone. Speech and PT/OT also evaluated him. Due to risk of aspiration he can have honey thick liquids. With regards to speech, it is recommended that patient would continue with speech language and pathology services with [facility] speech rehab. PT/OT recommended [facility] PT/OT which he will get at home. Currently he has no motor strength in the left upper and limited mobility but not strength in the lower right extremity, has moderate dysarthria and a left facial droop that has been improving. Weakness on the right lower extremity has been worsening. A repeat CT and MRI did not show signs of a new stroke. This is most likely deconditioning from immobility.   -Cw PT/OT  -Cw speech therapy -Aspiration precautions  -Continued evaluation is needed should diet need to be modified. Pt should follow with PCP OP.  -Aspirin 81mg  daily for three months -Eliquis 2.5mg  BID   -Rosuvastatin 10mg  daily  -fenofibrate 54mg  daily  -ezetimibe 10mg  daily   #Atelectasis #At risk for aspiration Had crackles on exam, afebrile, WBC WNL. Spirometry was started and then developed increased sputum. Increased sputum could be related to increased spirometry. Less likely PNA given that he is afebrile and has no leukocytosis. Improvement in sputum production after a couple days of spirometry also go against PNA. He is at risk for aspiration and aspiration precautions should be followed. SLP recommendations as follow:    Diet recommendations: Dysphagia 1 (puree);Honey-thick liquid Liquids provided via: Teaspoon Medication Administration: Crushed with puree Supervision: Staff to assist with self feeding;Full supervision/cueing for compensatory strategies Compensations: Slow rate;Small sips/bites Postural Changes and/or Swallow Maneuvers: Seated upright 90 degrees -cw spirometry  -ctm fever -scopolamine patch for secretions  #AKI- resolved #CKD Stage 3b Patient presented with a serum creatinine of  2.02.  After IV fluids, his creatinine did improve to 1.7.  I suspect that his AKI was due to dehydration, he was administered more IV maintenance fluids and is now at his baseline. Pt has CKD Stage 3b. Please continue to monitor renal function with BMP.    # Permanent atrial fibrillation Patient is not on rate control home, he does take eliquis for AC. Patient was restarted on Eliquis 2.5 mg twice daily.   # Severe obstructive sleep apnea Patient has severe OSA and uses home BiPAP at night. He is to use his BiPAP nightly.   # Type 2 diabetes mellitus Patient is taking glipizide 5 mg daily at home.  Can continue with this medication with CBGc checks two to three times daily as glipizide can induce hypoglycemia.    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 Palliative was consulted for  GOC but pt gets care through [facility]. Ongoing GOC discussions should be ongoing for this patient, who is on hospice but remains full code and would like TPN should he need it. Further education is needed to assess understanding of family and TPN as TPN may cause more harm in hospice situations (American Gastro[employer] Association recommendations). Family also asked about cortrak. Pt is able to tolerate a PO diet. Would recommend him eat naturally vs artificially while he can. He is on a home regimen of seroquel 25 mg nightly.    -cw Seroquel 25mg  daily -Delirium precautions -continue care through [facility] OP -recommendation to have further discussions about continuation of preventative treatments vs discontinuing them given dysphagia. Pt to follow with PCP OP.  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    #Hypothyroidism  TSH and free T4 were WNL. On Synthroid 25 mcg, pt is to continue on this.    DISCHARGE INSTRUCTIONS:   Discharge Instructions     Discharge instructions   Complete by: As directed    Dr. Lowell Guitar,   Thank you for allowing [place] to take care of you while you were in the hospital. You presented with weakness on your right side and were admitted for a left paramedian pontine stroke that was found on MRI.   Neurology was consulted and you were started on the following medications:   Aspirin 81 mg that you need to take for 3 months (April 11th 2025) along with Eliquis 2.5mg  twice a day. After April 11th, you will need to only take Eliquis.   Please continue taking your rosuvastatin, fenofibrate, and ezetimibe.   Your speech and swallowing capabilities were affected as well. Speech evaluated you and made the following recommendations:   Diet recommendations: Dysphagia 1 (puree);Honey-thick liquid Liquids provided via: Teaspoon Medication Administration: Crushed with puree Supervision: Staff to assist with self feeding;Full  supervision/cueing for compensatory strategies Compensations: Slow rate;Small sips/bites Postural Changes and/or Swallow Maneuvers: Seated upright 90 degrees  Because your swallowing was affected you are at risk of aspiration which can predispose you to pneumonia. You did have increased lung sounds during your admission but no signs of an infection. You started spirometry and had increased secretions after that which improved on the day of your discharge. Please continue with aspiration precautions (eating slowly, following instructions provided by speech and monitor for a fever, chills, malaise.  Please follow closely with your primary care doctor as your speech and swallowing will continue  to evolve. Continue with spirometry and use the scopolamine patch that we have prescribed for you.  Some of your weakness could be related to your immobility and deconditioning. Your weakness caused by the stroke should also improve with more movement. Please continue working with PT and OT at home.   Please follow up with your primary care doctor within a week of your hospitalization.   Thank you again for allowing [place] to take care of your during your stay.   Sincerely,  Manuela Neptune, MD  PGY-1   Discharge wound care:   Complete by: As directed    Please continue to offload pressure by changing positions regularly and continue previous wound care by the health care team.   Increase activity slowly   Complete by: As directed        SUBJECTIVE:   Getting some strengh back and feels better. Will get MBS today. Caregivers wonder if he would need a CXR.  Discharge Vitals:   BP 119/61 (BP Location: Left Arm)   Pulse 74   Temp 98.4 F (36.9 C) (Oral)   Resp 18   SpO2 95%   OBJECTIVE:  Physical Exam Constitutional:      General: He is not in acute distress.    Appearance: He is ill-appearing. He is not toxic-appearing.  HENT:     Mouth/Throat:     Mouth: Mucous membranes are dry.      Pharynx: No oropharyngeal exudate.  Eyes:     Extraocular Movements: Extraocular movements intact.  Cardiovascular:     Rate and Rhythm: Normal rate. Rhythm irregular.     Heart sounds: No murmur heard.    No friction rub. No gallop.  Pulmonary:     Effort: Pulmonary effort is normal.     Breath sounds: Rales present.  Abdominal:     General: There is no distension.     Palpations: Abdomen is soft.     Tenderness: There is no abdominal tenderness. There is no guarding.  Musculoskeletal:     Right lower leg: No edema.     Left lower leg: No edema.  Skin:    General: Skin is warm and dry.  Neurological:     Mental Status: He is alert.     Sensory: Sensation is intact.     Motor: Weakness present.     Comments: Strength 2/5 on right lower extremity. 0/5 strength on right upper extremity. Strength 2/5 on left. Facial droop improving. Tongue and palatal elevation midline. More alert today      Pertinent Labs, Studies, and Procedures:     Latest Ref Rng & Units 03/29/2023    5:02 AM 03/28/2023    5:53 AM 03/27/2023    6:29 AM  CBC  WBC 4.0 - 10.5 K/uL 6.3  6.8  5.6   Hemoglobin 13.0 - 17.0 g/dL 54.0  98.1  19.1   Hematocrit 39.0 - 52.0 % 37.3  39.2  38.4   Platelets 150 - 400 K/uL 151  164  175        Latest Ref Rng & Units 03/30/2023    5:40 AM 03/29/2023    5:02 AM 03/28/2023    5:53 AM  CMP  Glucose 70 - 99 mg/dL 478  295  621   BUN 8 - 23 mg/dL 38  27  23   Creatinine 0.61 - 1.24 mg/dL 3.08  6.57  8.46   Sodium 135 - 145 mmol/L 138  137  136   Potassium  3.5 - 5.1 mmol/L 4.5  4.6  4.6   Chloride 98 - 111 mmol/L 105  104  104   CO2 22 - 32 mmol/L 25  23  23    Calcium 8.9 - 10.3 mg/dL 8.9  9.4  9.6     DG Swallowing Func-Speech Pathology Result Date: 03/27/2023 Table formatting from the original result was not included. 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 He was kept NPO after failing Yale swallow with RN. 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 He was seen by SLP at [facility] during an admission in late October of 2024 with evaluation and treatment for dysphagia as well as speech/voice.  He is actively on hospice. Bedside swallow evaluation completed 03/26/23 and recommended full liquids/pudding thick and to proceed with MBS. Patient declined to have MBS on 1/11 when SLP evaluating but RN secure messaged SLP in early evening and patient had then agreed to do MBS. Clinical Impression: Clinical Impression: Patient presents with a mildly impaired oral phase of swallow and a moderately impaired pharyngeal phase of swallow. anterior hyoid excursion and laryngeal elevation were partial in completion. Laryngeal vestibule closure was incomplete as well as delayed/mistimed, leading to gross aspiration of nectar thick and honey thick liquids via spoon sips. Swallow was initiated at level of the vallecular sinus with puree/pudding consistency and at level of the pyriform sinus with nectar and honey thick liquids. Aspiration was silent (PAS 8) and occured prior to and during the swallow, with nectar and honey thick liquid boluses transiting directly into open airway. No instances of aspiration observed with puree solids x8 trials and only one instance of trace, flash penetration above the vocal cords (PAS 2) that fully exited laryngeal vestibule. Only trace vallecular and aryepiglottic fold residuals observed across all consistencies. No retrograde movement of barium and all consistencies transited through PES without difficulty or delay. Patient did tolerate 1/2 spoon sips of honey thick  liquids without aspiration (one instance of penetration). SLP is recommending PO diet of Dys 1 (puree) solids and pudding thick liquids. 1/2 spoon sips of honey thick liquids can be given by SLP only during skilled treatment sessions. Factors that may increase risk of adverse event in presence of aspiration Rubye Oaks [employer] Clearance Coots 2021): Factors that may increase risk of adverse event in presence of aspiration Rubye Oaks [employer] Clearance Coots 2021): Poor general health and/or compromised immunity; Weak cough; Limited mobility; Frequent aspiration of large volumes; Dependence for feeding and/or oral hygiene; Frail or deconditioned Recommendations/Plan: Swallowing Evaluation Recommendations Swallowing Evaluation Recommendations Recommendations: PO diet PO Diet Recommendation: Dysphagia 1 (Pureed); Extremely thick liquids (Level 4, pudding thick) Liquid Administration via: Spoon Medication Administration: Crushed with puree Supervision: Full supervision/cueing for swallowing strategies; Full assist for feeding Swallowing strategies  : Slow rate; Small bites/sips Postural changes: Position pt fully upright for meals; Stay upright 30-60 min after meals Oral care recommendations: Oral care BID (2x/day); Staff/trained caregiver to provide oral care Caregiver Recommendations: Have oral suction available; Avoid jello, ice cream, thin soups, popsicles Treatment Plan Treatment Plan Treatment recommendations: Therapy as outlined in treatment plan below Follow-up recommendations: Home health SLP Functional  status assessment: Patient has had a recent decline in their functional status and demonstrates the ability to make significant improvements in function in a reasonable and predictable amount of time. Treatment frequency: Min 2x/week Treatment duration: 2 weeks Interventions: Aspiration precaution training; Trials of upgraded texture/liquids; Diet toleration management by SLP; Patient/family education; Compensatory techniques Recommendations  Recommendations for follow up therapy are one component of a multi-disciplinary discharge planning process, led by the attending physician.  Recommendations may be updated based on patient status, additional functional criteria and insurance authorization. Assessment: Orofacial Exam: Orofacial Exam Oral Cavity: Oral Hygiene: WFL Oral Cavity - Dentition: Adequate natural dentition Orofacial Anatomy: WFL Oral Motor/Sensory Function: Suspected cranial nerve impairment CN V - Trigeminal: Right motor impairment CN VII - Facial: Right motor impairment CN IX - Glossopharyngeal, CN X - Vagus: Right motor impairment Anatomy: Anatomy: WFL Boluses Administered: Boluses Administered Boluses Administered: Mildly thick liquids (Level 2, nectar thick); Moderately thick liquids (Level 3, honey thick); Puree  Oral Impairment Domain: Oral Impairment Domain Lip Closure: No labial escape Tongue control during bolus hold: Not tested Bolus transport/lingual motion: Brisk tongue motion Oral residue: Complete oral clearance Location of oral residue : N/A Initiation of pharyngeal swallow : Valleculae; Pyriform sinuses  Pharyngeal Impairment Domain: Pharyngeal Impairment Domain Soft palate elevation: No bolus between soft palate (SP)/pharyngeal wall (PW) Laryngeal elevation: Partial superior movement of thyroid cartilage/partial approximation of arytenoids to epiglottic petiole Anterior hyoid excursion: Partial anterior movement Epiglottic movement: Complete inversion Laryngeal vestibule closure: Incomplete, narrow column air/contrast in laryngeal vestibule Pharyngeal stripping wave : Present - complete Pharyngeal contraction (A/P view only): N/A Pharyngoesophageal segment opening: Complete distension and complete duration, no obstruction of flow Tongue base retraction: Trace column of contrast or air between tongue base and PPW Pharyngeal residue: Trace residue within or on pharyngeal structures Location of pharyngeal residue: Valleculae;  Aryepiglottic folds  Esophageal Impairment Domain: Esophageal Impairment Domain Esophageal clearance upright position: Complete clearance, esophageal coating Pill: No data recorded Penetration/Aspiration Scale Score: Penetration/Aspiration Scale Score 2.  Material enters airway, remains ABOVE vocal cords then ejected out: Puree 8.  Material enters airway, passes BELOW cords without attempt by patient to eject out (silent aspiration) : Moderately thick liquids (Level 3, honey thick); Mildly thick liquids (Level 2, nectar thick) Compensatory Strategies: Compensatory Strategies Compensatory strategies: No   General Information: Caregiver present: Yes  Diet Prior to this Study: Extremely thick liquids (Level 4, pudding thick); Full liquid diet   Temperature : Normal   Respiratory Status: WFL   Supplemental O2: None (Room air)   History of Recent Intubation: No  Behavior/Cognition: Alert; Cooperative; Pleasant mood Self-Feeding Abilities: Dependent for feeding Baseline vocal quality/speech: Dysphonic Volitional Cough: Able to elicit Volitional Swallow: Able to elicit Exam Limitations: No limitations Goal Planning: Prognosis for improved oropharyngeal function: Fair Barriers to Reach Goals: Severity of deficits; Motivation; Overall medical prognosis No data recorded Patient/Family Stated Goal: per caregiver Melody, patient is adamant about returning home Consulted and agree with results and recommendations: Patient; Family member/caregiver Pain: Pain Assessment Pain Assessment: No/denies pain Faces Pain Scale: 0 Pain Intervention(s): Monitored during session End of Session: Start Time:SLP Start Time (ACUTE ONLY): 1605 Stop Time: SLP Stop Time (ACUTE ONLY): 1630 Time Calculation:SLP Time Calculation (min) (ACUTE ONLY): 25 min Charges: SLP Evaluations $ SLP Speech Visit: 1 Visit SLP Evaluations $BSS Swallow: 1 Procedure $MBS Swallow: 1 Procedure SLP visit diagnosis: SLP Visit Diagnosis: Dysphagia, unspecified (R13.10) Past  Medical History: Past Medical History: Diagnosis Date  Diabetes mellitus without  complication (HCC)   DVT (deep venous thrombosis) (HCC)   Embolus (HCC)   HTN (hypertension)  Past Surgical History: Past Surgical History: Procedure Laterality Date  TOTAL HIP ARTHROPLASTY   Angela Nevin, MA, CCC-SLP Speech Therapy  ECHOCARDIOGRAM COMPLETE BUBBLE STUDY Result Date: 03/26/2023    ECHOCARDIOGRAM REPORT   Patient Name:   Troy Mueller Date of Exam: 03/26/2023 Medical Rec #:  [id number]           Height:       71.0 in Accession #:    [id number]          Weight:       282.2 lb Date of Birth:  October 07, 1932            BSA:          2.441 m Patient Age:    89 years            BP:           149/76 mmHg Patient Gender: M                   HR:           63 bpm. Exam Location:  Inpatient Procedure: 2D Echo, Cardiac Doppler, Color Doppler and Saline Contrast Bubble            Study Indications:    Stroke.  History:        Patient has no prior history of Echocardiogram examinations.                 Abnormal ECG, Arrythmias:Atrial Fibrillation,                 Signs/Symptoms:Dyspnea and Shortness of Breath; Risk                 Factors:Hypertension, Dyslipidemia and Sleep Apnea.  Sonographer:    Sheralyn Boatman RDCS Referring Phys: [id number] GRACE LAU  Sonographer Comments: Technically difficult study due to poor echo windows. Patient supine. IMPRESSIONS  1. Left ventricular ejection fraction, by estimation, is 55 to 60%. The left ventricle has normal function. The left ventricle has no regional wall motion abnormalities. Left ventricular diastolic parameters are indeterminate.  2. Right ventricular systolic function is low normal. The right ventricular size is moderately enlarged. Mildly increased right ventricular wall thickness.  3. Left atrial size was severely dilated.  4. Right atrial size was moderately dilated.  5. The mitral valve is abnormal. Trivial mitral valve regurgitation. Moderate mitral stenosis. Severe mitral annular  calcification.  6. The aortic valve is abnormal. Aortic valve regurgitation is not visualized. Aortic valve sclerosis/calcification is present, without any evidence of aortic stenosis.  7. There is borderline dilatation of the aortic root, measuring 39 mm. There is borderline dilatation of the ascending aorta, measuring 39 mm.  8. Agitated saline contrast bubble study was negative, with no evidence of any interatrial shunt. FINDINGS  Left Ventricle: Left ventricular ejection fraction, by estimation, is 55 to 60%. The left ventricle has normal function. The left ventricle has no regional wall motion abnormalities. The left ventricular internal cavity size was normal in size. There is  no left ventricular hypertrophy. Left ventricular diastolic parameters are indeterminate. Right Ventricle: The right ventricular size is moderately enlarged. Mildly increased right ventricular wall thickness. Right ventricular systolic function is low normal. Left Atrium: Left atrial size was severely dilated. Right Atrium: Right atrial size was moderately dilated. Pericardium: There is no evidence of pericardial effusion. Mitral  Valve: The mitral valve is abnormal. Severe mitral annular calcification. Trivial mitral valve regurgitation. Moderate mitral valve stenosis. MV peak gradient, 10.2 mmHg. The mean mitral valve gradient is 4.0 mmHg. Tricuspid Valve: The tricuspid valve is normal in structure. Tricuspid valve regurgitation is not demonstrated. No evidence of tricuspid stenosis. Aortic Valve: The aortic valve is abnormal. Aortic valve regurgitation is not visualized. Aortic valve sclerosis/calcification is present, without any evidence of aortic stenosis. Aortic valve mean gradient measures 5.0 mmHg. Aortic valve peak gradient measures 8.7 mmHg. Aortic valve area, by VTI measures 3.94 cm. Pulmonic Valve: The pulmonic valve was normal in structure. Pulmonic valve regurgitation is not visualized. No evidence of pulmonic stenosis.  Aorta: The aortic root and ascending aorta are structurally normal, with no evidence of dilitation. There is borderline dilatation of the aortic root, measuring 39 mm. There is borderline dilatation of the ascending aorta, measuring 39 mm. IAS/Shunts: No atrial level shunt detected by color flow Doppler. Agitated saline contrast was given intravenously to evaluate for intracardiac shunting. Agitated saline contrast bubble study was negative, with no evidence of any interatrial shunt.  LEFT VENTRICLE PLAX 2D LVIDd:         4.80 cm LVIDs:         3.50 cm LV PW:         1.50 cm LV IVS:        1.60 cm LVOT diam:     2.50 cm LV SV:         112 LV SV Index:   46 LVOT Area:     4.91 cm  LV Volumes (MOD) LV vol d, MOD A2C: 70.6 ml LV vol d, MOD A4C: 130.0 ml LV vol s, MOD A2C: 25.5 ml LV vol s, MOD A4C: 58.4 ml LV SV MOD A2C:     45.1 ml LV SV MOD A4C:     130.0 ml LV SV MOD BP:      60.1 ml RIGHT VENTRICLE RV S prime:     11.60 cm/s TAPSE (M-mode): 1.6 cm LEFT ATRIUM           Index        RIGHT ATRIUM           Index LA diam:      4.90 cm 2.01 cm/m   RA Area:     18.00 cm LA Vol (A2C): 59.5 ml 24.38 ml/m  RA Volume:   50.20 ml  20.57 ml/m LA Vol (A4C): 96.4 ml 39.50 ml/m  AORTIC VALVE AV Area (Vmax):    3.83 cm AV Area (Vmean):   3.76 cm AV Area (VTI):     3.94 cm AV Vmax:           147.50 cm/s AV Vmean:          99.050 cm/s AV VTI:            0.284 m AV Peak Grad:      8.7 mmHg AV Mean Grad:      5.0 mmHg LVOT Vmax:         115.00 cm/s LVOT Vmean:        75.800 cm/s LVOT VTI:          0.228 m LVOT/AV VTI ratio: 0.80  AORTA Ao Root diam: 3.90 cm Ao Asc diam:  3.90 cm MITRAL VALVE MV Area (PHT): 3.85 cm     SHUNTS MV Area VTI:   2.76 cm     Systemic VTI:  0.23 m MV  Peak grad:  10.2 mmHg    Systemic Diam: 2.50 cm MV Mean grad:  4.0 mmHg MV Vmax:       1.60 m/s MV Vmean:      88.8 cm/s MV Decel Time: 197 msec MV E velocity: 149.00 cm/s Kardie Tobb DO Electronically signed by Thomasene Ripple DO Signature Date/Time:  03/26/2023/4:06:58 PM    Final    MR BRAIN WO CONTRAST Result Date: 03/26/2023 CLINICAL DATA:  Stroke suspected EXAM: MRI HEAD WITHOUT CONTRAST TECHNIQUE: Multiplanar, multiecho pulse sequences of the brain and surrounding structures were obtained without intravenous contrast. COMPARISON:  No prior MRI available, correlation is made with 03/26/2023 CT head and 01/01/2023 CTA head neck FINDINGS: Brain: Restricted diffusion with ADC correlate in the left pons (series 2, images 15-19), which is not yet associated with increased T2 hyperintense signal. No acute hemorrhage, mass, mass effect, or midline shift. No hydrocephalus. 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 Partial empty sella. Normal craniocervical junction. Vascular: Normal arterial flow voids. Skull and upper cervical spine: Normal marrow signal. Sinuses/Orbits: Clear paranasal sinuses. Prominent fluid in the extra-axial spaces around the optic nerves, which could represent mild atrophy of the optic nerves, which are somewhat tortuous. Dysconjugate gaze. Other: The mastoid air cells are well aerated. IMPRESSION: 1. Acute infarct in the left pons. 2. 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 These results will be called to the ordering clinician or representative by the Radiologist Assistant, and communication documented in the PACS or [employer]. Electronically Signed   By: Wiliam Ke M.D.   On: 03/26/2023 12:11   CT ANGIO HEAD NECK W WO CM W PERF (CODE STROKE) Result Date: 03/26/2023 CLINICAL DATA:  89 year old male code stroke. Slurred speech and right facial droop. EXAM: CT ANGIOGRAPHY HEAD AND NECK CT PERFUSION BRAIN TECHNIQUE: Multidetector CT imaging of the head and neck was performed using the standard protocol during bolus administration of intravenous contrast. Multiplanar CT image  reconstructions and MIPs were obtained to evaluate the vascular anatomy. Carotid stenosis measurements (when applicable) are obtained utilizing NASCET criteria, using the distal internal carotid diameter as the denominator. Multiphase CT imaging of the brain was performed following IV bolus contrast injection. Subsequent parametric perfusion maps were calculated using RAPID software. RADIATION DOSE REDUCTION: This exam was performed according to the departmental dose-optimization program which includes automated exposure control, adjustment of the mA and/or kV according to patient size and/or use of iterative reconstruction technique. CONTRAST:  75mL OMNIPAQUE IOHEXOL 350 MG/ML SOLN COMPARISON:  Head CT 0655 hours today. FINDINGS: CT Brain Perfusion Findings: ASPECTS: 10 CBF (<30%) Volume: 0mL.  No abnormal CBF parameter. Perfusion (Tmax>6.0s) volume: 20mL, but appears peripheral and artifactual probably associated with the chronic extra-axial fluid. Mismatch Volume: None when allowing for artifact. Infarction Location:No infarct core detected. CTA NECK Skeleton: Cervical spine and right TMJ degeneration. Levels of degenerative cervical ankylosis. No acute osseous abnormality identified. Upper chest: Atelectatic changes to the trachea but upper lung ventilation is relatively normal. No superior mediastinal lymphadenopathy. Other neck: Retropharyngeal left carotid artery.  No acute finding. Aortic arch: Calcified aortic atherosclerosis.  Bovine arch. Right carotid system: Mild brachiocephalic artery plaque without stenosis. Calcified plaque at the right CCA origin and tortuosity without stenosis. Mild calcified plaque at the right carotid bifurcation. Mild tortuosity and no stenosis. Left carotid system: Bovine left CCA origin without plaque or stenosis. Calcified plaque along the anterior left CCA without stenosis. Bulky  calcified plaque at the left ICA origin with numerically estimated 60% stenosis with respect to  the distal vessel. Retropharyngeal course of the left ICA with tortuosity, no additional stenosis to the skull base. Vertebral arteries: Calcified right subclavian artery origin and tortuosity without significant stenosis. Normal right vertebral artery origin. Right vertebral artery appears dominant and patent to the skull base with no significant plaque or stenosis. Proximal left subclavian artery calcified plaque with less than 50% stenosis. Mild calcified plaque at the left vertebral artery origin without stenosis. Non dominant left vertebral artery is patent with no significant plaque or stenosis to the skull base. CTA HEAD Posterior circulation: Dominant right vertebral artery supplies the basilar, left vertebral terminates in PICA. Bulky right V4 circumferential calcified plaque and Severe right V4 stenosis on series 5, image 162. Distal vertebral and basilar artery origin are patent. Right AICA origin is patent. Patent basilar artery with mild plaque and no stenosis. Fetal right PCA origin. Tortuous left P1 segment. SCA and PCA origins are patent. Bilateral PCA branches are patent and within normal limits. Anterior circulation: Both ICA siphons are patent. Moderate left siphon calcified plaque but only mild left siphon stenosis. Right siphon widespread and moderate calcified plaque, circumferential at the distal cavernous and supraclinoid segments with mild to moderate subsequent stenosis. Right posterior communicating artery origin remains normal. Patent carotid termini. Patent MCA origins. Dominant left and diminutive or absent right ACA A1 segments. No left A1 stenosis. Calcified atherosclerosis at the A2 origin. Azygous type ACA anatomy. No convincing ACA branch occlusion or significant stenosis. Left MCA M1 segment is patent with ectatic and calcified left MCA trifurcation (series 10, image 141) which remains patent without high-grade stenosis. Right MCA origin is calcified with mild stenosis. Right MCA  M1 segment is patent without stenosis. Tortuous right MCA bifurcation with bulky calcification (series 10, image 68) but only mild associated stenosis. No definite MCA branch occlusion. There is moderate bilateral MCA branch irregularity and stenoses including M2 segments on the left and M3 segments on the right (series 13, image 15. No discrete intracranial aneurysm. Venous sinuses: Early contrast timing, grossly patent. Anatomic variants: Dominant right vertebral artery supplies the basilar, left terminates in PICA. Dominant left and diminutive or absent right ACA A1 segments. Review of the MIP images confirms the above findings IMPRESSION: 1. CTA is negative for large vessel occlusion. Negative CT Perfusion; Tmax artifact suspected secondary to chronic subdural fluid collections. 2. Widespread intracranial and extracranial atherosclerosis, including areas of hemodynamically significant stenosis (perhaps most notably Severe Stenosis of the dominant Right Vertebral V4 segment which is the sole supply to the Basilar). But no circle-of-Willis branch occlusion is identified. 3.  Aortic Atherosclerosis (ICD10-I70.0). Study discussed by telephone with Dr. Wilford Corner on 03/26/2023 at 07:19 . Electronically Signed   By: Odessa Fleming M.D.   On: 03/26/2023 07:23   CT HEAD CODE STROKE WO CONTRAST Result Date: 03/26/2023 CLINICAL DATA:  Code stroke. 89 year old male with slurred speech and right facial droop. EXAM: CT HEAD WITHOUT CONTRAST TECHNIQUE: Contiguous axial images were obtained from the base of the skull through the vertex without intravenous contrast. RADIATION DOSE REDUCTION: This exam was performed according to the departmental dose-optimization program which includes automated exposure control, adjustment of the mA and/or kV according to patient size and/or use of iterative reconstruction technique. COMPARISON:  Head CT 01/01/2023. FINDINGS: Brain: Chronic bilateral low-density subdural fluid collections superimposed on  brain volume loss (coronal image 50), stable since October. No midline shift or acute  intracranial mass effect. No ventriculomegaly. Basilar cisterns remain patent. Bulky dural calcifications incidentally noted. No acute intracranial hemorrhage identified. Stable and largely normal for age gray-white differentiation. No cortically based acute infarct identified. Vascular: Chronic severe skull base and bilateral MCA calcified atherosclerosis. Chronic MCA tortuosity. No suspicious intracranial vascular hyperdensity. Skull: Mild skull base motion artifact. No acute osseous abnormality identified. Sinuses/Orbits: Visualized paranasal sinuses and mastoids are stable and well aerated. Other: No gaze deviation.  No acute scalp soft tissue finding. ASPECTS [facility] Stroke Program Early CT Score) Total score (0-10 with 10 being normal): 10 IMPRESSION: 1. No acute cortically based infarct or acute intracranial hemorrhage identified. ASPECTS 10. 2. 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 No midline shift or acute intracranial mass effect. 3. Chronic severe intracranial calcified atherosclerosis. No suspicious vessel hyperdensity. These results were communicated to Dr. Wilford Corner at 7:03 am on 03/26/2023 by text page via the [facility] messaging system. Electronically Signed   By: Odessa Fleming M.D.   On: 03/26/2023 07:03     Signed: Manuela Neptune, MD Internal Medicine Resident, PGY-1 [facility]  Pager: [phone] 3:08 PM, 03/31/2023

## 2023-03-31 NOTE — TOC Transition Note (Signed)
Transition of Care [facility]) - Discharge Note   Patient Details  Name: Troy Mueller MRN: [id number] Date of Birth: Sep 19, 1932  Transition of Care [facility]) CM/SW Contact:  Kermit Balo, RN Phone Number: 03/31/2023, 2:23 PM   Clinical Narrative:     Pt is discharging home with resumption of services through [facility]. Shawn with [facility] is aware of dc home today.  Pt has all needed DME at home. Pt is also private paying for additional therapies at home through [facility]. Information on the AVS. [facility] will contact pts caregiver, Onalee Hua to arrange timing of the therapies.  Pt will transport home via ambulance through [facility]. Packet for the ambulance is at the desk.  Medications for home to be picked up from [facility] pharmacy and delivered to the room per staff.    Final next level of care: Hospice Medical Facility Barriers to Discharge: No Barriers Identified   Patient Goals and CMS Choice   CMS Medicare.gov Compare Post Acute Care list provided to:: Patient Represenative (must comment) Choice offered to / list presented to : Patient, Adult Children      Discharge Placement                       Discharge Plan and Services Additional resources added to the After Visit Summary for     Discharge Planning Services: CM Consult                      HH Arranged: PT, OT, Speech Therapy HH Agency: [facility] Date [facility] Agency Contacted: 03/31/23   Representative spoke with at [facility] Agency: shawn  Social Drivers of Health (SDOH) Interventions SDOH Screenings   Food Insecurity: No Food Insecurity (03/27/2023)  Housing: Low Risk  (03/27/2023)  Transportation Needs: No Transportation Needs (03/27/2023)  Utilities: Not At Risk (03/27/2023)  Financial Resource Strain: Low Risk  (08/05/2022)   Received from [facility]  Social Connections: Moderately Integrated (03/27/2023)  Tobacco Use: Low Risk  (03/26/2023)     Readmission  Risk Interventions     No data to display

## 2023-06-14 ENCOUNTER — Other Ambulatory Visit (HOSPITAL_COMMUNITY): Payer: Self-pay

## 2023-10-22 ENCOUNTER — Inpatient Hospital Stay
Admission: EM | Admit: 2023-10-22 | Discharge: 2023-10-25 | DRG: 682 | Disposition: A | Discharge status: Hospice | Attending: Family Medicine | Admitting: Family Medicine

## 2023-10-22 ENCOUNTER — Other Ambulatory Visit: Payer: Self-pay

## 2023-10-22 ENCOUNTER — Emergency Department

## 2023-10-22 DIAGNOSIS — R34 Anuria and oliguria: Secondary | ICD-10-CM | POA: Diagnosis present

## 2023-10-22 DIAGNOSIS — R4781 Slurred speech: Secondary | ICD-10-CM | POA: Diagnosis present

## 2023-10-22 DIAGNOSIS — E66812 Obesity, class 2: Secondary | ICD-10-CM | POA: Diagnosis present

## 2023-10-22 DIAGNOSIS — J9612 Chronic respiratory failure with hypercapnia: Secondary | ICD-10-CM

## 2023-10-22 DIAGNOSIS — R509 Fever, unspecified: Secondary | ICD-10-CM | POA: Diagnosis present

## 2023-10-22 DIAGNOSIS — E1122 Type 2 diabetes mellitus with diabetic chronic kidney disease: Secondary | ICD-10-CM | POA: Diagnosis present

## 2023-10-22 DIAGNOSIS — Z515 Encounter for palliative care: Secondary | ICD-10-CM | POA: Diagnosis not present

## 2023-10-22 DIAGNOSIS — Z8052 Family history of malignant neoplasm of bladder: Secondary | ICD-10-CM

## 2023-10-22 DIAGNOSIS — Z79899 Other long term (current) drug therapy: Secondary | ICD-10-CM

## 2023-10-22 DIAGNOSIS — R338 Other retention of urine: Secondary | ICD-10-CM

## 2023-10-22 DIAGNOSIS — Z886 Allergy status to analgesic agent status: Secondary | ICD-10-CM | POA: Diagnosis not present

## 2023-10-22 DIAGNOSIS — Z79890 Hormone replacement therapy (postmenopausal): Secondary | ICD-10-CM

## 2023-10-22 DIAGNOSIS — I130 Hypertensive heart and chronic kidney disease with heart failure and stage 1 through stage 4 chronic kidney disease, or unspecified chronic kidney disease: Secondary | ICD-10-CM | POA: Diagnosis present

## 2023-10-22 DIAGNOSIS — I5033 Acute on chronic diastolic (congestive) heart failure: Secondary | ICD-10-CM | POA: Diagnosis present

## 2023-10-22 DIAGNOSIS — Z7982 Long term (current) use of aspirin: Secondary | ICD-10-CM

## 2023-10-22 DIAGNOSIS — Z7901 Long term (current) use of anticoagulants: Secondary | ICD-10-CM | POA: Diagnosis not present

## 2023-10-22 DIAGNOSIS — G4733 Obstructive sleep apnea (adult) (pediatric): Secondary | ICD-10-CM

## 2023-10-22 DIAGNOSIS — Z6836 Body mass index (BMI) 36.0-36.9, adult: Secondary | ICD-10-CM

## 2023-10-22 DIAGNOSIS — Z7401 Bed confinement status: Secondary | ICD-10-CM

## 2023-10-22 DIAGNOSIS — I2510 Atherosclerotic heart disease of native coronary artery without angina pectoris: Secondary | ICD-10-CM | POA: Diagnosis present

## 2023-10-22 DIAGNOSIS — N4889 Other specified disorders of penis: Secondary | ICD-10-CM | POA: Diagnosis present

## 2023-10-22 DIAGNOSIS — Z96649 Presence of unspecified artificial hip joint: Secondary | ICD-10-CM | POA: Diagnosis present

## 2023-10-22 DIAGNOSIS — I4821 Permanent atrial fibrillation: Secondary | ICD-10-CM | POA: Diagnosis present

## 2023-10-22 DIAGNOSIS — R0989 Other specified symptoms and signs involving the circulatory and respiratory systems: Secondary | ICD-10-CM | POA: Diagnosis present

## 2023-10-22 DIAGNOSIS — I69351 Hemiplegia and hemiparesis following cerebral infarction affecting right dominant side: Secondary | ICD-10-CM

## 2023-10-22 DIAGNOSIS — E785 Hyperlipidemia, unspecified: Secondary | ICD-10-CM

## 2023-10-22 DIAGNOSIS — N1832 Chronic kidney disease, stage 3b: Secondary | ICD-10-CM | POA: Diagnosis present

## 2023-10-22 DIAGNOSIS — N5082 Scrotal pain: Secondary | ICD-10-CM | POA: Diagnosis present

## 2023-10-22 DIAGNOSIS — N390 Urinary tract infection, site not specified: Secondary | ICD-10-CM | POA: Diagnosis present

## 2023-10-22 DIAGNOSIS — R1310 Dysphagia, unspecified: Secondary | ICD-10-CM

## 2023-10-22 DIAGNOSIS — N179 Acute kidney failure, unspecified: Principal | ICD-10-CM | POA: Diagnosis present

## 2023-10-22 DIAGNOSIS — E1149 Type 2 diabetes mellitus with other diabetic neurological complication: Secondary | ICD-10-CM

## 2023-10-22 DIAGNOSIS — R600 Localized edema: Secondary | ICD-10-CM | POA: Diagnosis present

## 2023-10-22 HISTORY — DX: Heart failure, unspecified: I50.9

## 2023-10-22 HISTORY — DX: Atherosclerotic heart disease of native coronary artery without angina pectoris: I25.10

## 2023-10-22 HISTORY — DX: Foot drop, unspecified foot: M21.379

## 2023-10-22 HISTORY — DX: Cerebral infarction, unspecified: I63.9

## 2023-10-22 HISTORY — DX: Disorder of kidney and ureter, unspecified: N28.9

## 2023-10-22 HISTORY — DX: Spinal stenosis, site unspecified: M48.00

## 2023-10-22 HISTORY — DX: Sleep apnea, unspecified: G47.30

## 2023-10-22 LAB — BRAIN NATRIURETIC PEPTIDE: B Natriuretic Peptide: 102.2 pg/mL — ABNORMAL HIGH (ref 0.0–100.0)

## 2023-10-22 LAB — BASIC METABOLIC PANEL WITH GFR
Anion gap: 12 (ref 5–15)
BUN: 106 mg/dL — ABNORMAL HIGH (ref 8–23)
CO2: 18 mmol/L — ABNORMAL LOW (ref 22–32)
Calcium: 8.7 mg/dL — ABNORMAL LOW (ref 8.9–10.3)
Chloride: 106 mmol/L (ref 98–111)
Creatinine, Ser: 3.97 mg/dL — ABNORMAL HIGH (ref 0.61–1.24)
GFR, Estimated: 14 mL/min — ABNORMAL LOW (ref >60)
Glucose, Bld: 194 mg/dL — ABNORMAL HIGH (ref 70–99)
Potassium: 5.4 mmol/L — ABNORMAL HIGH (ref 3.5–5.1)
Sodium: 136 mmol/L (ref 135–145)

## 2023-10-22 LAB — CBC
HCT: 33.9 % — ABNORMAL LOW (ref 39.0–52.0)
Hemoglobin: 10.7 g/dL — ABNORMAL LOW (ref 13.0–17.0)
MCH: 31.3 pg (ref 26.0–34.0)
MCHC: 31.6 g/dL (ref 30.0–36.0)
MCV: 99.1 fL (ref 80.0–100.0)
Platelets: 175 K/uL (ref 150–400)
RBC: 3.42 MIL/uL — ABNORMAL LOW (ref 4.22–5.81)
RDW: 13.5 % (ref 11.5–15.5)
WBC: 9.2 K/uL (ref 4.0–10.5)
nRBC: 0 % (ref 0.0–0.2)

## 2023-10-22 LAB — GLUCOSE, CAPILLARY: Glucose-Capillary: 151 mg/dL — ABNORMAL HIGH (ref 70–99)

## 2023-10-22 MED ORDER — EZETIMIBE 10 MG PO TABS
10.0000 mg | ORAL_TABLET | Freq: Every day | ORAL | Status: DC
  Administered 2023-10-23 – 2023-10-25 (×5): 10 mg via ORAL
  Filled 2023-10-22 (×4): qty 1

## 2023-10-22 MED ORDER — TAMSULOSIN HCL 0.4 MG PO CAPS
0.4000 mg | ORAL_CAPSULE | Freq: Every day | ORAL | Status: DC
  Administered 2023-10-22 – 2023-10-24 (×4): 0.4 mg via ORAL
  Filled 2023-10-22 (×3): qty 1

## 2023-10-22 MED ORDER — SENNOSIDES-DOCUSATE SODIUM 8.6-50 MG PO TABS
1.0000 | ORAL_TABLET | Freq: Every evening | ORAL | Status: DC | PRN
  Administered 2023-10-23: 1 via ORAL
  Filled 2023-10-22: qty 1

## 2023-10-22 MED ORDER — ASPIRIN 81 MG PO CHEW
81.0000 mg | CHEWABLE_TABLET | Freq: Every day | ORAL | Status: DC
  Administered 2023-10-22 – 2023-10-25 (×6): 81 mg via ORAL
  Filled 2023-10-22 (×4): qty 1

## 2023-10-22 MED ORDER — INSULIN ASPART 100 UNIT/ML IJ SOLN
0.0000 [IU] | Freq: Three times a day (TID) | INTRAMUSCULAR | Status: DC
  Administered 2023-10-23: 2 [IU] via SUBCUTANEOUS
  Administered 2023-10-23 (×2): 3 [IU] via SUBCUTANEOUS
  Administered 2023-10-24 – 2023-10-25 (×7): 2 [IU] via SUBCUTANEOUS
  Administered 2023-10-25: 3 [IU] via SUBCUTANEOUS
  Administered 2023-10-25: 2 [IU] via SUBCUTANEOUS
  Administered 2023-10-25: 3 [IU] via SUBCUTANEOUS
  Filled 2023-10-22 (×8): qty 1

## 2023-10-22 MED ORDER — HYDRALAZINE HCL 10 MG PO TABS
10.0000 mg | ORAL_TABLET | Freq: Three times a day (TID) | ORAL | Status: DC
  Administered 2023-10-22: 10 mg via ORAL
  Filled 2023-10-22: qty 1

## 2023-10-22 MED ORDER — ISOSORBIDE MONONITRATE ER 30 MG PO TB24
30.0000 mg | ORAL_TABLET | Freq: Every day | ORAL | Status: DC
  Administered 2023-10-22 – 2023-10-23 (×2): 30 mg via ORAL
  Filled 2023-10-22 (×2): qty 1

## 2023-10-22 MED ORDER — CHLORHEXIDINE GLUCONATE CLOTH 2 % EX PADS
6.0000 | MEDICATED_PAD | Freq: Every day | CUTANEOUS | Status: DC
  Administered 2023-10-23 – 2023-10-25 (×5): 6 via TOPICAL
  Filled 2023-10-22: qty 6

## 2023-10-22 MED ORDER — SODIUM CHLORIDE 0.9 % IV BOLUS
1000.0000 mL | Freq: Once | INTRAVENOUS | Status: AC
  Administered 2023-10-22: 1000 mL via INTRAVENOUS

## 2023-10-22 MED ORDER — APIXABAN 2.5 MG PO TABS
2.5000 mg | ORAL_TABLET | Freq: Two times a day (BID) | ORAL | Status: DC
  Administered 2023-10-22 – 2023-10-25 (×9): 2.5 mg via ORAL
  Filled 2023-10-22 (×7): qty 1

## 2023-10-22 MED ORDER — TRAMADOL HCL 50 MG PO TABS
100.0000 mg | ORAL_TABLET | Freq: Once | ORAL | Status: AC
  Administered 2023-10-22: 100 mg via ORAL
  Filled 2023-10-22: qty 2

## 2023-10-22 MED ORDER — SODIUM CHLORIDE 0.9 % IV SOLN
2.0000 g | INTRAVENOUS | Status: DC
  Administered 2023-10-22 – 2023-10-24 (×4): 2 g via INTRAVENOUS
  Filled 2023-10-22 (×4): qty 20

## 2023-10-22 MED ORDER — HYDRALAZINE HCL 20 MG/ML IJ SOLN: 5.0000 mg | Freq: Four times a day (QID) | INTRAMUSCULAR | Status: DC | PRN

## 2023-10-22 MED ORDER — QUETIAPINE FUMARATE 100 MG PO TABS
100.0000 mg | ORAL_TABLET | Freq: Every day | ORAL | Status: DC
  Administered 2023-10-22 – 2023-10-24 (×4): 100 mg via ORAL
  Filled 2023-10-22: qty 4
  Filled 2023-10-22: qty 1
  Filled 2023-10-22 (×2): qty 4
  Filled 2023-10-22 (×2): qty 1

## 2023-10-22 MED ORDER — FUROSEMIDE 10 MG/ML IJ SOLN
4.0000 mg/h | INTRAVENOUS | Status: DC
  Administered 2023-10-22: 4 mg/h via INTRAVENOUS
  Filled 2023-10-22: qty 20

## 2023-10-22 MED ORDER — ROSUVASTATIN CALCIUM 10 MG PO TABS
10.0000 mg | ORAL_TABLET | Freq: Every day | ORAL | Status: DC
  Administered 2023-10-23 – 2023-10-25 (×5): 10 mg via ORAL
  Filled 2023-10-22 (×4): qty 1

## 2023-10-22 MED ORDER — ONDANSETRON HCL 4 MG/2ML IJ SOLN: 4.0000 mg | Freq: Four times a day (QID) | INTRAMUSCULAR | Status: DC | PRN

## 2023-10-22 MED ORDER — LACTATED RINGERS IV BOLUS: 1000.0000 mL | Freq: Once | INTRAVENOUS | Status: DC

## 2023-10-22 MED ORDER — IPRATROPIUM-ALBUTEROL 0.5-2.5 (3) MG/3ML IN SOLN: 3.0000 mL | RESPIRATORY_TRACT | Status: DC | PRN

## 2023-10-22 MED ORDER — ONDANSETRON HCL 4 MG PO TABS: 4.0000 mg | ORAL_TABLET | Freq: Four times a day (QID) | ORAL | Status: DC | PRN

## 2023-10-22 MED ORDER — FENOFIBRATE 54 MG PO TABS
54.0000 mg | ORAL_TABLET | Freq: Every day | ORAL | Status: DC
  Administered 2023-10-23 – 2023-10-25 (×5): 54 mg via ORAL
  Filled 2023-10-22 (×3): qty 1

## 2023-10-22 MED ORDER — LEVOTHYROXINE SODIUM 25 MCG PO TABS
25.0000 ug | ORAL_TABLET | Freq: Every morning | ORAL | Status: DC
  Administered 2023-10-23 – 2023-10-25 (×5): 25 ug via ORAL
  Filled 2023-10-22 (×3): qty 1

## 2023-10-22 NOTE — ED Provider Notes (Signed)
 [facility] Provider Note    Event Date/Time   First MD Initiated Contact with Patient 10/22/23 1505     (approximate)   History   Urinary Retention   HPI  Troy Mueller is a 89 y.o. male who presents to the ED for evaluation of Urinary Retention   Review a medical DC summary from January.  Admitted for ischemic pontine stroke.  Otherwise history of permanent A-fib on Eliquis , DM, CKD.  Hospice patient via [facility].   Patient presents to the ED from home via EMS for concerns for lower extremity edema and poor urinary output.  Symptoms over the past 2-3 days.  They have trialed Lasix  at home with minimal improvement of lower extremity edema.   Patient has no complaints and reports feeling fine  Daughter is uncertain if she would want to start hemodialysis for the patient  Physical Exam   Triage Vital Signs: ED Triage Vitals  Encounter Vitals Group     BP 10/22/23 1321 (!) 159/84     Girls Systolic BP Percentile --      Girls Diastolic BP Percentile --      Boys Systolic BP Percentile --      Boys Diastolic BP Percentile --      Pulse Rate 10/22/23 1321 92     Resp 10/22/23 1321 (!) 24     Temp 10/22/23 1321 98.7 F (37.1 C)     Temp Source 10/22/23 1321 Oral     SpO2 10/22/23 1321 96 %     Weight 10/22/23 1328 276 lb (125.2 kg)     Height 10/22/23 1328 6' 1 (1.854 m)     Head Circumference --      Peak Flow --      Pain Score 10/22/23 1329 0     Pain Loc --      Pain Education --      Exclude from Growth Chart --     Most recent vital signs: Vitals:   10/22/23 1321  BP: (!) 159/84  Pulse: 92  Resp: (!) 24  Temp: 98.7 F (37.1 C)  SpO2: 96%    General: Awake, no distress.  CV:  Good peripheral perfusion.  Resp:  Normal effort.  Abd:  No distention.  MSK:  No deformity noted.  Anasarca. Neuro:  No focal deficits appreciated. Other:     ED Results / Procedures / Treatments   Labs (all labs ordered are listed,  but only abnormal results are displayed) Labs Reviewed  BASIC METABOLIC PANEL WITH GFR - Abnormal; Notable for the following components:      Result Value   Potassium 5.4 (*)    CO2 18 (*)    Glucose, Bld 194 (*)    BUN 106 (*)    Creatinine, Ser 3.97 (*)    Calcium  8.7 (*)    GFR, Estimated 14 (*)    All other components within normal limits  CBC - Abnormal; Notable for the following components:   RBC 3.42 (*)    Hemoglobin 10.7 (*)    HCT 33.9 (*)    All other components within normal limits  BRAIN NATRIURETIC PEPTIDE - Abnormal; Notable for the following components:   B Natriuretic Peptide 102.2 (*)    All other components within normal limits  URINALYSIS, ROUTINE W REFLEX MICROSCOPIC    EKG   RADIOLOGY   Official radiology report(s): No results found.  PROCEDURES and INTERVENTIONS:  Procedures  Medications  sodium  chloride 0.9 % bolus 1,000 mL (has no administration in time range)     IMPRESSION / MDM / ASSESSMENT AND PLAN / ED COURSE  I reviewed the triage vital signs and the nursing notes.  Differential diagnosis includes, but is not limited to, CHF, hepatic dysfunction, renal failure, urinary retention, UTI  {Patient presents with symptoms of an acute illness or injury that is potentially life-threatening.  Patient presents with signs of acute renal failure requiring medical admission.  Patient with dry mucous membranes and third spacing of fluids.  Renal failure with mildly elevated potassium at 5.4.  No leukocytosis.  Minimally elevated BNP.  will start IV normal saline  Clinical Course as of 10/22/23 1614  Sat Oct 22, 2023  1603 I consult with medicine who agrees to admit [DS]    Clinical Course User Index [DS] Claudene Rover, MD     FINAL CLINICAL IMPRESSION(S) / ED DIAGNOSES   Final diagnoses:  Acute renal failure, unspecified acute renal failure type [facility])     Rx / DC Orders   ED Discharge Orders     None        Note:  This  document was prepared using Dragon voice recognition software and may include unintentional dictation errors.   Claudene Rover, MD 10/22/23 [phone]

## 2023-10-22 NOTE — H&P (Addendum)
 History and Physical    Troy Mueller FMW:[id number] DOB: [birth year] DOA: 10/22/2023  PCP: Diedra Lame, MD (Confirm with patient/family/NH records and if not entered, this has to be entered at [facility] point of entry) Patient coming from: Home  I have personally briefly reviewed patient's old medical records in [facility]  Chief Complaint: Decreased urine  HPI: Troy Mueller is a 89 y.o. male with medical history significant of CKD stage IIIb, chronic HFpEF, pontine stroke with residual right-sided weakness on Eliquis , HTN, HLD, CAD, IIDM, morbid obesity, brought in by family member for evaluation of worsening of peripheral edema and decreased urine output.  Patient has a history of CKD stage IIIb, and chronic peripheral edema.  He lives at home with home care and home PT therapy, due to his body habitus, usually the nurse will provide a dry towel for him to urinate and this week home care nurse started to notice increasing peripheral edema to the level of breath or scrotum, as well as decreased urinary output for last 2 days, less than 1 wet towel a day.  And yesterday patient was given 10 mg of Lasix  and nurse collected 1 soaked towel yesterday evening.  This morning patient was given 20 mg of Lasix  and shifted to ED.  Patient does complain about feeling shortness of breath but denies any chest pain or cough, no fever or chills no nausea vomiting or diarrhea and he has been eating and drinking as below.  ED Course: Afebrile, blood pressure 150/80 O2 saturation 96% on room air.  Checks x-ray showed bilateral pulmonary congestion, sodium 136 potassium 5.4 BUN 106 creatinine 3.9 compared to baseline 1.9-2.0 glucose 106 WBC 9.2 hemoglobin 10.7.  Patient was given IV bolus 1000 ml x 1 in the ED.  Review of Systems: As per HPI otherwise 14 point review of systems negative.    Past Medical History:  Diagnosis Date   CHF (congestive heart failure) (HCC)    just started on lasix  8/25    Coronary artery disease    Diabetes mellitus without complication (HCC)    DVT (deep venous thrombosis) (HCC)    Embolus (HCC)    Foot drop    both, worse on R   HTN (hypertension)    Renal disorder    CKD, mild   Sleep apnea    Spinal stenosis    lumbar   Stroke [facility])    pons stroke Jan 2025. R sided weakness and dysphagia, thickened liquids only    Past Surgical History:  Procedure Laterality Date   TOTAL HIP ARTHROPLASTY       reports that he has never smoked. He has never used smokeless tobacco. He reports that he does not currently use alcohol . He reports that he does not use drugs.  Allergies  Allergen Reactions   Tylenol  [Acetaminophen ]     Rash    Family History  Problem Relation Age of Onset   Bladder Cancer Brother      Prior to Admission medications   Medication Sig Start Date End Date Taking? Authorizing Provider  amLODipine  (NORVASC ) 5 MG tablet Take 5 mg by mouth daily.    [provider]  apixaban  (ELIQUIS ) 2.5 MG TABS tablet Take 1 tablet (2.5 mg total) by mouth 2 (two) times daily. 03/31/23   Fernand Prost, MD  aspirin  81 MG chewable tablet Chew 1 tablet (81 mg total) by mouth daily. Take until 06/24/2022. 03/31/23   Fernand Prost, MD  ezetimibe  (ZETIA )  10 MG tablet Take 10 mg by mouth daily.    [provider]  feeding supplement (ENSURE ENLIVE / ENSURE PLUS) LIQD Take 237 mLs by mouth 2 (two) times daily between meals. 03/31/23   Fernand Prost, MD  fenofibrate  54 MG tablet Take 54 mg by mouth daily.    [provider]  food thickener (SIMPLYTHICK, HONEY/LEVEL 3/MODERATELY THICK,) LIQD Take 2 packets by mouth as needed to make extremely thick pudding 03/31/23   Fernand Prost, MD  glipiZIDE  (GLUCOTROL  XL) 5 MG 24 hr tablet Take 1 tablet (5 mg total) by mouth daily with breakfast. 03/31/23   Fernand Prost, MD  ipratropium-albuterol  (DUONEB) 0.5-2.5 (3) MG/3ML SOLN Take 3 mLs by nebulization every 4 (four) hours as needed. 01/12/23    [provider]  levothyroxine  (SYNTHROID ) 25 MCG tablet Take 25 mcg by mouth every morning.    [provider]  lip balm (CARMEX) ointment Apply 1 Application topically as needed (cold sores). 03/31/23   Fernand Prost, MD  omega-3 acid ethyl esters (LOVAZA ) 1 g capsule Take 2 capsules by mouth 2 (two) times daily. 12/30/21   [provider]  polyethylene glycol powder (GLYCOLAX /MIRALAX ) 17 GM/SCOOP powder Mix as directed and take 17 g by mouth daily. 03/31/23   Khan, Ghalib, MD  polyvinyl alcohol  (LIQUIFILM TEARS) 1.4 % ophthalmic solution Place 1 drop into both eyes as needed for dry eyes. 03/31/23   Fernand Prost, MD  PREVIDENT 5000 PLUS 1.1 % CREA dental cream Place 1 Application onto teeth daily. 01/12/22   [provider]  QUEtiapine  (SEROQUEL ) 100 MG tablet Take 100 mg by mouth at bedtime.    [provider]  rosuvastatin  (CRESTOR ) 10 MG tablet Take 10 mg by mouth daily. 12/09/21   [provider]  scopolamine  (TRANSDERM-SCOP) 1 MG/3DAYS Place 1 patch (1.5 mg total) onto the skin every 3 (three) days. 03/31/23   Fernand Prost, MD    Physical Exam: Vitals:   10/22/23 1321 10/22/23 1328 10/22/23 1600 10/22/23 1624  BP: (!) 159/84  (!) 157/62   Pulse: 92  92   Resp: (!) 24  14   Temp: 98.7 F (37.1 C)     TempSrc: Oral     SpO2: 96%  97% 96%  Weight:  125.2 kg    Height:  6' 1 (1.854 m)      Constitutional: NAD, calm, comfortable Vitals:   10/22/23 1321 10/22/23 1328 10/22/23 1600 10/22/23 1624  BP: (!) 159/84  (!) 157/62   Pulse: 92  92   Resp: (!) 24  14   Temp: 98.7 F (37.1 C)     TempSrc: Oral     SpO2: 96%  97% 96%  Weight:  125.2 kg    Height:  6' 1 (1.854 m)     Eyes: PERRL, lids and conjunctivae normal ENMT: Mucous membranes are moist. Posterior pharynx clear of any exudate or lesions.Normal dentition.  Neck: normal, supple, no masses, no thyromegaly Respiratory: clear to auscultation bilaterally, no wheezing,  bilateral crackles, increasing respiratory effort. No accessory muscle use.  Cardiovascular: Regular rate and rhythm, no murmurs / rubs / gallops.  Anasarca to bilateral lower abdomen,. 2+ pedal pulses. No carotid bruits.  Abdomen: no tenderness, no masses palpated. No hepatosplenomegaly. Bowel sounds positive.  Musculoskeletal: no clubbing / cyanosis. No joint deformity upper and lower extremities. Good ROM, no contractures. Normal muscle tone.  Skin: no rashes, lesions, ulcers. No induration Neurologic: CN 2-12 grossly intact. Sensation intact, DTR normal.  Chronic right-sided weakness Psychiatric: Normal judgment and insight. Alert and oriented x 3. Normal mood.     Labs on Admission: I have personally reviewed following labs and imaging studies  CBC: Recent Labs  Lab 10/22/23 1327  WBC 9.2  HGB 10.7*  HCT 33.9*  MCV 99.1  PLT 175   Basic Metabolic Panel: Recent Labs  Lab 10/22/23 1327  NA 136  K 5.4*  CL 106  CO2 18*  GLUCOSE 194*  BUN 106*  CREATININE 3.97*  CALCIUM  8.7*   GFR: Estimated Creatinine Clearance: 17.5 mL/min (A) (by C-G formula based on SCr of 3.97 mg/dL (H)). Liver Function Tests: No results for input(s): AST, ALT, ALKPHOS, BILITOT, PROT, ALBUMIN in the last 168 hours. No results for input(s): LIPASE, AMYLASE in the last 168 hours. No results for input(s): AMMONIA in the last 168 hours. Coagulation Profile: No results for input(s): INR, PROTIME in the last 168 hours. Cardiac Enzymes: No results for input(s): CKTOTAL, CKMB, CKMBINDEX, TROPONINI in the last 168 hours. BNP (last 3 results) No results for input(s): PROBNP in the last 8760 hours. HbA1C: No results for input(s): HGBA1C in the last 72 hours. CBG: No results for input(s): GLUCAP in the last 168 hours. Lipid Profile: No results for input(s): CHOL, HDL, LDLCALC, TRIG, CHOLHDL, LDLDIRECT in the last 72 hours. Thyroid Function Tests: No  results for input(s): TSH, T4TOTAL, FREET4, T3FREE, THYROIDAB in the last 72 hours. Anemia Panel: No results for input(s): VITAMINB12, FOLATE, FERRITIN, TIBC, IRON, RETICCTPCT in the last 72 hours. Urine analysis:    Component Value Date/Time   COLORURINE YELLOW (A) 01/12/2023 1858   APPEARANCEUR HAZY (A) 01/12/2023 1858   LABSPEC 1.013 01/12/2023 1858   PHURINE 5.0 01/12/2023 1858   GLUCOSEU NEGATIVE 01/12/2023 1858   HGBUR NEGATIVE 01/12/2023 1858   BILIRUBINUR NEGATIVE 01/12/2023 1858   KETONESUR NEGATIVE 01/12/2023 1858   PROTEINUR NEGATIVE 01/12/2023 1858   NITRITE NEGATIVE 01/12/2023 1858   LEUKOCYTESUR NEGATIVE 01/12/2023 1858    Radiological Exams on Admission: No results found.  EKG: Ordered  Assessment/Plan Principal Problem:   AKI (acute kidney injury) (HCC) Active Problems:   Oligouria  (please populate well all problems here in Problem List. (For example, if patient is on BP meds at home and you resume or decide to hold them, it is a problem that needs to be her. Same for CAD, COPD, HLD and so on)  AKI on CKD stage IIIB Oliguria Anasarca Acute on chronic HFpEF decompensated -Given the history, suspect ATN, long-term prognosis extremely poor.  Explained to patient and his family/POA and home care team at bedside. - Significant fluid overload - Renal ultrasound is pending to rule out any hydronephrosis - Home palliative care nurse and ED nurse tried to put the Foley but unsuccessful due to significant swelling of scrotum and fully retracted penis.  Contacted on-call urology Dr. Selma who is working at [facility] OR now and will not able to come in right away to help insert the Foley.  Contact ICU catheter team who is coming to see the patient. - Strict I&O's - Discussed with on-call nephrology, given the fact that the patient is currently fluid overloaded both clinically and image study wise on x-ray, nephrology recommended Lasix  drip. - Long discussion  with patient her daughter/POA and home care team at bedside, confirmed that patient still desires full code with full scope of treatment measures including HD.  I went on to explain to patient and his family regarding patient's current condition  and comorbidities make him a poor candidate for HD, patient and family expressed understanding and maintained that fullscope measures including HD are desired.  Nephrology made aware.  History of pontine stroke with residual right-sided paresis Embolic stroke -On aspirin  and Eliquis   HTN, uncontrolled - Discontinue amlodipine  due to worsening of peripheral edema - Start hydralazine  and Imdur  regimen - Lasix  as above  IIDM - Hold off glimepiride - SSI  Morbid obesity - BMI= 36  Total critical care time spent 65 minutes  DVT prophylaxis: Eliquis  Code Status: Full code Family Communication: Daughter/POA at bedside Disposition Plan: Patient is sick with AKI, possibly ATN requiring IV Lasix  and inpatient nephrology consult for impending HD, expect more than 2 midnight hospital stay Consults called: Nephrology, urology Admission status: PCU admit   Cort ONEIDA Mana MD [facility] Pager [phone]  10/22/2023, 4:55 PM

## 2023-10-22 NOTE — Clinical Note (Incomplete)
 Patient was sent up to 2A from ED at 2030 with several meds not done that were due at 5pm

## 2023-10-22 NOTE — ED Triage Notes (Addendum)
 Pt to ED from home with home health RN, [facility] for urinary retention since 2 days. Pt leaks urine when home health RN presses on bladder and pt is guarding bladder area. Pt also has pitting edema to both LE and scrotum since 2 days ago, started on Lasix  10mg  yesterday and then 20mg  today (pt is on hospice). Pt has R sided weakness and dysphagia from pons stroke in January 2025.  Pt is bedbound, they use Hoyer lift at home. Pt can sit in chair once placed there. Pt has been incontinent of stool since 2 weeks. Pt has pronounced swelling to abdomen and scrotum. Pt denies pain.   Hospice nurses have tried unsucessfully to place foley and coude catheters.   Home health RN gave albuterol  tx this AM.

## 2023-10-22 NOTE — ED Triage Notes (Signed)
 First RN Note:  Pt, from home, c/o abdominal distention and urinary retention x3 days.  Home nurse has unsuccessfully tried to places foleys and coudes.

## 2023-10-22 NOTE — Consult Note (Signed)
 Urology Consult   Physician requesting consult: Dr. Laurita, MD  Reason for consult: Urinary retention, difficult Foley catheter  History of Present Illness: Troy Mueller is a 89 y.o. with a past medical history significant for CKD stage IIIb, chronic HF, pontine stroke on Eliquis , hypertension, obesity who is brought into the ED for worsening peripheral edema and decreased urine output.  Patient denies prior urology history.  He states that over the past couple of days, he has had difficulty voiding.  His caregiver attempted Foley catheter placement yesterday however was unsuccessful.  Renal ultrasound 10/22/2023 with no hydronephrosis and distended bladder.  Attempt was made by nursing staff in the ED however were unsuccessful given degree of penoscrotal edema.  Past Medical History:  Diagnosis Date   CHF (congestive heart failure) (HCC)    just started on lasix  8/25   Coronary artery disease    Diabetes mellitus without complication (HCC)    DVT (deep venous thrombosis) (HCC)    Embolus (HCC)    Foot drop    both, worse on R   HTN (hypertension)    Renal disorder    CKD, mild   Sleep apnea    Spinal stenosis    lumbar   Stroke [facility])    pons stroke Jan 2025. R sided weakness and dysphagia, thickened liquids only    Past Surgical History:  Procedure Laterality Date   TOTAL HIP ARTHROPLASTY      Current Hospital Medications:  Home Meds:  No current facility-administered medications on file prior to encounter.   Current Outpatient Medications on File Prior to Encounter  Medication Sig Dispense Refill   amLODipine  (NORVASC ) 5 MG tablet Take 5 mg by mouth daily.     apixaban  (ELIQUIS ) 2.5 MG TABS tablet Take 1 tablet (2.5 mg total) by mouth 2 (two) times daily. 60 tablet 0   ezetimibe  (ZETIA ) 10 MG tablet Take 10 mg by mouth daily.     fenofibrate  54 MG tablet Take 54 mg by mouth daily.     gabapentin (NEURONTIN) 300 MG capsule Take 300 mg by mouth at bedtime.      glipiZIDE  (GLUCOTROL  XL) 5 MG 24 hr tablet Take 1 tablet (5 mg total) by mouth daily with breakfast. 30 tablet 0   ipratropium-albuterol  (DUONEB) 0.5-2.5 (3) MG/3ML SOLN Take 3 mLs by nebulization every 4 (four) hours as needed.     levothyroxine  (SYNTHROID ) 25 MCG tablet Take 25 mcg by mouth every morning.     polyethylene glycol powder (GLYCOLAX /MIRALAX ) 17 GM/SCOOP powder Mix as directed and take 17 g by mouth daily. 238 g 0   polyvinyl alcohol  (LIQUIFILM TEARS) 1.4 % ophthalmic solution Place 1 drop into both eyes as needed for dry eyes. 15 mL 0   QUEtiapine  (SEROQUEL ) 100 MG tablet Take 100 mg by mouth at bedtime.     ramipril (ALTACE) 2.5 MG capsule Take 2.5 mg by mouth at bedtime.     rOPINIRole (REQUIP) 0.25 MG tablet Take 0.25 mg by mouth at bedtime.     rosuvastatin  (CRESTOR ) 10 MG tablet Take 10 mg by mouth daily.     scopolamine  (TRANSDERM-SCOP) 1 MG/3DAYS Place 1 patch (1.5 mg total) onto the skin every 3 (three) days. 10 patch 0   senna (SENOKOT) 8.6 MG TABS tablet Take 1 tablet by mouth every morning.     traMADol  HCl 100 MG TABS Take 50 mg by mouth as needed.     zolpidem  (AMBIEN ) 10 MG tablet Take 10 mg  by mouth at bedtime as needed. for sleep     aspirin  81 MG chewable tablet Chew 1 tablet (81 mg total) by mouth daily. Take until 06/24/2022. (Patient not taking: Reported on 10/22/2023) 30 tablet 0   feeding supplement (ENSURE ENLIVE / ENSURE PLUS) LIQD Take 237 mLs by mouth 2 (two) times daily between meals. 237 mL 12   food thickener (SIMPLYTHICK, HONEY/LEVEL 3/MODERATELY THICK,) LIQD Take 2 packets by mouth as needed to make extremely thick pudding 180 packet 0   lip balm (CARMEX) ointment Apply 1 Application topically as needed (cold sores). 10 g 0   omega-3 acid ethyl esters (LOVAZA ) 1 g capsule Take 2 capsules by mouth 2 (two) times daily. (Patient not taking: Reported on 10/22/2023)     PREVIDENT 5000 PLUS 1.1 % CREA dental cream Place 1 Application onto teeth daily. (Patient  not taking: Reported on 10/22/2023)       Scheduled Meds:  apixaban   2.5 mg Oral BID   aspirin   81 mg Oral Daily   Chlorhexidine  Gluconate Cloth  6 each Topical Daily   ezetimibe   10 mg Oral Daily   [START ON 10/23/2023] fenofibrate   54 mg Oral Daily   hydrALAZINE   10 mg Oral Q8H   insulin  aspart  0-9 Units Subcutaneous TID WC   isosorbide  mononitrate  30 mg Oral Daily   [START ON 10/23/2023] levothyroxine   25 mcg Oral q morning   QUEtiapine   100 mg Oral QHS   rosuvastatin   10 mg Oral Daily   tamsulosin   0.4 mg Oral QPC supper   Continuous Infusions:  cefTRIAXone  (ROCEPHIN )  IV 2 g (10/22/23 1857)   furosemide  (LASIX ) 200 mg in dextrose  5 % 100 mL (2 mg/mL) infusion     PRN Meds:.hydrALAZINE , ipratropium-albuterol , ondansetron  **OR** ondansetron  (ZOFRAN ) IV, senna-docusate  Allergies:  Allergies  Allergen Reactions   Tylenol  [Acetaminophen ]     Rash    Family History  Problem Relation Age of Onset   Bladder Cancer Brother     Social History:  reports that he has never smoked. He has never used smokeless tobacco. He reports that he does not currently use alcohol . He reports that he does not use drugs.  ROS: A complete review of systems was performed.  All systems are negative except for pertinent findings as noted.  Physical Exam:  Vital signs in last 24 hours: Temp:  [98.7 F (37.1 C)-99.6 F (37.6 C)] 99.6 F (37.6 C) (08/09 1856) Pulse Rate:  [80-92] 80 (08/09 1856) Resp:  [14-24] 14 (08/09 1600) BP: (133-159)/(62-84) 145/66 (08/09 1900) SpO2:  [96 %-97 %] 96 % (08/09 1624) Weight:  [125.2 kg] 125.2 kg (08/09 1328) Constitutional:  Significant peripheral edema GU: Penoscrotal edema, uncircumcised phallus however able to visualize meatus with retraction.  Laboratory Data:  Recent Labs    10/22/23 1327  WBC 9.2  HGB 10.7*  HCT 33.9*  PLT 175    Recent Labs    10/22/23 1327  NA 136  K 5.4*  CL 106  GLUCOSE 194*  BUN 106*  CALCIUM  8.7*  CREATININE  3.97*     Results for orders placed or performed during the hospital encounter of 10/22/23 (from the past 24 hours)  Basic metabolic panel     Status: Abnormal   Collection Time: 10/22/23  1:27 PM  Result Value Ref Range   Sodium 136 135 - 145 mmol/L   Potassium 5.4 (H) 3.5 - 5.1 mmol/L   Chloride 106 98 - 111 mmol/L  CO2 18 (L) 22 - 32 mmol/L   Glucose, Bld 194 (H) 70 - 99 mg/dL   BUN 893 (H) 8 - 23 mg/dL   Creatinine, Ser 6.02 (H) 0.61 - 1.24 mg/dL   Calcium  8.7 (L) 8.9 - 10.3 mg/dL   GFR, Estimated 14 (L) >60 mL/min   Anion gap 12 5 - 15  CBC     Status: Abnormal   Collection Time: 10/22/23  1:27 PM  Result Value Ref Range   WBC 9.2 4.0 - 10.5 K/uL   RBC 3.42 (L) 4.22 - 5.81 MIL/uL   Hemoglobin 10.7 (L) 13.0 - 17.0 g/dL   HCT 66.0 (L) 60.9 - 47.9 %   MCV 99.1 80.0 - 100.0 fL   MCH 31.3 26.0 - 34.0 pg   MCHC 31.6 30.0 - 36.0 g/dL   RDW 86.4 88.4 - 84.4 %   Platelets 175 150 - 400 K/uL   nRBC 0.0 0.0 - 0.2 %  Brain natriuretic peptide     Status: Abnormal   Collection Time: 10/22/23  1:27 PM  Result Value Ref Range   B Natriuretic Peptide 102.2 (H) 0.0 - 100.0 pg/mL   No results found for this or any previous visit (from the past 240 hours).  Renal Function: Recent Labs    10/22/23 1327  CREATININE 3.97*   Estimated Creatinine Clearance: 17.5 mL/min (A) (by C-G formula based on SCr of 3.97 mg/dL (H)).  Radiologic Imaging: US  Renal Result Date: 10/22/2023 CLINICAL DATA:  Renal failure, anuric EXAM: RENAL / URINARY TRACT ULTRASOUND COMPLETE COMPARISON:  None Available. FINDINGS: Right Kidney: Renal measurements: 10.4 x 8.2 x 6.3 cm volume: 280 mL. Echogenicity within normal limits. No mass or hydronephrosis visualized. Left Kidney: Renal measurements: 9.4 x 6.1 x 4.6 cm = volume: 137 mL. Echogenicity within normal limits. No mass or hydronephrosis visualized. Urinary bladder: Appears normal for degree of bladder distention. Other: Markedly limited evaluation due to  patient body habitus. IMPRESSION: Grossly unremarkable renal ultrasound with limited evaluation. Electronically Signed   By: Morgane  Naveau M.D.   On: 10/22/2023 17:34   DG Chest 1 View Result Date: 10/22/2023 CLINICAL DATA:  [id number] CHF (congestive heart failure) (HCC) [id number] EXAM: CHEST  1 VIEW COMPARISON:  Chest x-ray 03/29/2023 FINDINGS: The heart and mediastinal contours are unchanged. Atherosclerotic plaque. Low lung volumes. No focal consolidation. Slightly increased interstitial markings. No pleural effusion. No pneumothorax. No acute osseous abnormality. IMPRESSION: Low lung volumes with possible mild pulmonary edema. Consider repeat chest x-ray hand lateral view with improved inspiratory effort. Electronically Signed   By: Morgane  Naveau M.D.   On: 10/22/2023 17:01    I independently reviewed the above imaging studies.  Procedure note: Under sterile conditions, a 14 Fr foley catheter was passed into the bladder with immediate return of 500 mL clear yellow urine  Impression/Recommendation Urinary retention Penoscrotal edema  Leave Foley catheter in place for at least 5 days, potentially longer given edema.  If void trial outpatient, please contact reasonable urology to arrange for follow-up appointment.  Matt R. Claudie Brickhouse MD 10/22/2023, 7:53 PM  [facility]  Pager: [phone]

## 2023-10-22 NOTE — Progress Notes (Signed)
 [facility] ED [facility] Liaison Note  This patient is currently followed by [facility].  [facility] will follow through discharge disposition.  Please call with any hospice related questions or concerns.    [facility] Liasion [phone]

## 2023-10-23 DIAGNOSIS — N179 Acute kidney failure, unspecified: Secondary | ICD-10-CM | POA: Diagnosis not present

## 2023-10-23 LAB — BASIC METABOLIC PANEL WITH GFR
Anion gap: 12 (ref 5–15)
BUN: 101 mg/dL — ABNORMAL HIGH (ref 8–23)
CO2: 18 mmol/L — ABNORMAL LOW (ref 22–32)
Calcium: 8.7 mg/dL — ABNORMAL LOW (ref 8.9–10.3)
Chloride: 109 mmol/L (ref 98–111)
Creatinine, Ser: 3.89 mg/dL — ABNORMAL HIGH (ref 0.61–1.24)
GFR, Estimated: 14 mL/min — ABNORMAL LOW (ref >60)
Glucose, Bld: 164 mg/dL — ABNORMAL HIGH (ref 70–99)
Potassium: 5.2 mmol/L — ABNORMAL HIGH (ref 3.5–5.1)
Sodium: 139 mmol/L (ref 135–145)

## 2023-10-23 LAB — CBC
HCT: 30.2 % — ABNORMAL LOW (ref 39.0–52.0)
Hemoglobin: 9.5 g/dL — ABNORMAL LOW (ref 13.0–17.0)
MCH: 30.8 pg (ref 26.0–34.0)
MCHC: 31.5 g/dL (ref 30.0–36.0)
MCV: 98.1 fL (ref 80.0–100.0)
Platelets: 147 K/uL — ABNORMAL LOW (ref 150–400)
RBC: 3.08 MIL/uL — ABNORMAL LOW (ref 4.22–5.81)
RDW: 13.4 % (ref 11.5–15.5)
WBC: 7 K/uL (ref 4.0–10.5)
nRBC: 0 % (ref 0.0–0.2)

## 2023-10-23 LAB — GLUCOSE, CAPILLARY
Glucose-Capillary: 158 mg/dL — ABNORMAL HIGH (ref 70–99)
Glucose-Capillary: 201 mg/dL — ABNORMAL HIGH (ref 70–99)
Glucose-Capillary: 223 mg/dL — ABNORMAL HIGH (ref 70–99)
Glucose-Capillary: 197 mg/dL — ABNORMAL HIGH (ref 70–99)

## 2023-10-23 MED ORDER — TRAMADOL HCL 50 MG PO TABS
50.0000 mg | ORAL_TABLET | Freq: Four times a day (QID) | ORAL | Status: DC | PRN
  Administered 2023-10-23 – 2023-10-24 (×3): 50 mg via ORAL
  Filled 2023-10-23 (×2): qty 1

## 2023-10-23 MED ORDER — SODIUM CHLORIDE 0.9 % IV SOLN
500.0000 mg | INTRAVENOUS | Status: DC
  Administered 2023-10-23 – 2023-10-24 (×3): 500 mg via INTRAVENOUS
  Filled 2023-10-23 (×3): qty 5

## 2023-10-23 NOTE — Progress Notes (Signed)
 PROGRESS NOTE    Troy Mueller  FMW:[id number] DOB: [birth year] DOA: 10/22/2023 PCP: Diedra Lame, MD  Chief Complaint  Patient presents with   Urinary Retention    Hospital Course:  Troy Mueller Is an 89 year old male with CKD stage IIIb, chronic heart failure with preserved EF, recent pontine CVA with residual right-sided weakness, bedbound at baseline, on Eliquis , hypertension, hyperlipidemia, CAD, diabetes, morbid obesity who was brought in for increasing peripheral edema and decreased urinary output.  Patient does have chronic peripheral edema.  He lives at home with 24/7 home care and daily physical therapy.  Patient has become increasingly edematous outpatient and his home care nurse noted decreasing urinary output.  He was given 10 mg of Lasix  with minimal improvement.  He was brought to the ED.  On arrival to the ED CXR reveals bilateral pulmonary congestion, creatinine of 3.9, and diffuse edema.  He was admitted for further workup. Renal ultrasound was without hydronephrosis but revealed distended bladder.  Multiple attempts were made to place Foley catheter but these were unsuccessful due to significant scrotal edema.  Urology was consulted and placed a 14 French Foley on 8/10. Given his worsening kidney function nephrology was consulted and recommended Lasix  drip.  Patient then had significant 13 L output over the following 24 hours.  Subjective: Patient denies any pain this morning.  Vitals do reveal fevers out the day today with Tmax of 101.7.  Patient's home care RNs are at bedside and tending to patient.  Objective: Vitals:   10/23/23 1000 10/23/23 1125 10/23/23 1305 10/23/23 1512  BP: (!) 100/43 (!) 118/56  (!) 99/52  Pulse:    84  Resp:      Temp: (!) 100.4 F (38 C) (!) 100.8 F (38.2 C) 98.6 F (37 C) (!) 101.7 F (38.7 C)  TempSrc: Oral Axillary Oral   SpO2:  94%  95%  Weight:      Height:        Intake/Output Summary (Last 24 hours) at 10/23/2023  1652 Last data filed at 10/23/2023 1330 Gross per 24 hour  Intake 1240 ml  Output 86874 ml  Net -11885 ml   Filed Weights   10/22/23 1328  Weight: 125.2 kg    Examination: General exam: Appears calm and comfortable, NAD  Respiratory system: No work of breathing, symmetric chest wall expansion Cardiovascular system: S1 & S2 heard, RRR.  Gastrointestinal system: Abdomen is nondistended, soft and nontender.  Neuro: Alert, reports the year I  s 2005, appropriately reports he was in the hospital although he believes [facility] in [place]. Extremities: Symmetric, expected ROM Skin: No rashes, lesions Psychiatry: Demonstrates appropriate judgement and insight. Mood & affect appropriate for situation.   Assessment & Plan:  Principal Problem:   AKI (acute kidney injury) (HCC) Active Problems:   Oligouria   AKI on CKD stage IIIb Oliguria Anasarca Urinary retention - Baseline creatinine around 2.0, creatinine on arrival 3.9 - Continue with Lasix  drip, patient has diuresed 13 L so far - Renal ultrasound negative for hydronephrosis - Urology consulted and placed Foley catheter for diffuse scrotal edema - Have discussed with nephrology, at this time patient is not a dialysis candidate - Continue with Lasix  drip  Fever - No leukocytosis - UA/Urine culture pending - CXR on arrival with pulmonary congestion.  Could be masking CAP.  Given patient's bedbound status he is high risk for atelectasis and superimposed bacterial pneumonia - Currently on ceftriaxone  for presumed UTI.  Will add  azithromycin  for CAP coverage - Consider repeat CXR tomorrow after patient has diuresed further - Incentive spirometry ordered  History of pontine stroke with residual right-sided paresis - On aspirin  and Eliquis , will continue both - Patient is bedbound at baseline - Has daily physical therapy at home.  Has 24/7 home health care nurses  Hypertension, uncontrolled - Have discontinued amlodipine  in  setting of peripheral edema - Blood pressure is low/normal.  Will discontinue antihypertensives for now monitor BP closely. - Continue with hydralazine  and Imdur  - Lasix  drip as above - Monitor blood pressure closely, titrate as needed  Diabetes - Sliding scale insulin  for now, titrate as needed  BMI 36 Class II obesity -- Outpatient follow up for lifestyle modification and risk factor management  Poor prognosis - Given patient's advanced age and comorbidities he has a generally poor prognosis.  Patient is on hospice services at home but remains full code and would like to continue all aggressive care.  Per hospice liaison patient is currently a hospice patient under CVD diagnosis.  I have discussed his care directly with his daughter, Troy Mueller, on the phone today.  She is understanding all of the above.  At this time she would like to pray for his health and we will continue to update her daily.  DVT prophylaxis: Elqiuis   Code Status: Full Code Disposition:  Inpatient pending significant clinical resolution  Consultants:  Treatment Team:  Consulting Physician: Selma Donnice SAUNDERS, MD  Procedures:    Antimicrobials:  Anti-infectives (From admission, onward)    Start     Dose/Rate Route Frequency Ordered Stop   10/22/23 1815  cefTRIAXone  (ROCEPHIN ) 2 g in sodium chloride  0.9 % 100 mL IVPB        2 g 200 mL/hr over 30 Minutes Intravenous Every 24 hours 10/22/23 1813         Data Reviewed: I have personally reviewed following labs and imaging studies CBC: Recent Labs  Lab 10/22/23 1327 10/23/23 0418  WBC 9.2 7.0  HGB 10.7* 9.5*  HCT 33.9* 30.2*  MCV 99.1 98.1  PLT 175 147*   Basic Metabolic Panel: Recent Labs  Lab 10/22/23 1327 10/23/23 0418  NA 136 139  K 5.4* 5.2*  CL 106 109  CO2 18* 18*  GLUCOSE 194* 164*  BUN 106* 101*  CREATININE 3.97* 3.89*  CALCIUM  8.7* 8.7*   GFR: Estimated Creatinine Clearance: 17.8 mL/min (A) (by C-G formula based on SCr of 3.89  mg/dL (H)). Liver Function Tests: No results for input(s): AST, ALT, ALKPHOS, BILITOT, PROT, ALBUMIN in the last 168 hours. CBG: Recent Labs  Lab 10/22/23 2151 10/23/23 0844 10/23/23 1200 10/23/23 1625  GLUCAP 151* 158* 201* 223*    No results found for this or any previous visit (from the past 240 hours).   Radiology Studies: US  Renal Result Date: 10/22/2023 CLINICAL DATA:  Renal failure, anuric EXAM: RENAL / URINARY TRACT ULTRASOUND COMPLETE COMPARISON:  None Available. FINDINGS: Right Kidney: Renal measurements: 10.4 x 8.2 x 6.3 cm volume: 280 mL. Echogenicity within normal limits. No mass or hydronephrosis visualized. Left Kidney: Renal measurements: 9.4 x 6.1 x 4.6 cm = volume: 137 mL. Echogenicity within normal limits. No mass or hydronephrosis visualized. Urinary bladder: Appears normal for degree of bladder distention. Other: Markedly limited evaluation due to patient body habitus. IMPRESSION: Grossly unremarkable renal ultrasound with limited evaluation. Electronically Signed   By: Morgane  Naveau M.D.   On: 10/22/2023 17:34   DG Chest 1 View Result Date: 10/22/2023 CLINICAL  DATA:  [id number] CHF (congestive heart failure) (HCC) [id number] EXAM: CHEST  1 VIEW COMPARISON:  Chest x-ray 03/29/2023 FINDINGS: The heart and mediastinal contours are unchanged. Atherosclerotic plaque. Low lung volumes. No focal consolidation. Slightly increased interstitial markings. No pleural effusion. No pneumothorax. No acute osseous abnormality. IMPRESSION: Low lung volumes with possible mild pulmonary edema. Consider repeat chest x-ray hand lateral view with improved inspiratory effort. Electronically Signed   By: Morgane  Naveau M.D.   On: 10/22/2023 17:01    Scheduled Meds:  apixaban   2.5 mg Oral BID   aspirin   81 mg Oral Daily   Chlorhexidine  Gluconate Cloth  6 each Topical Daily   ezetimibe   10 mg Oral Daily   fenofibrate   54 mg Oral Daily   hydrALAZINE   10 mg Oral Q8H   insulin  aspart  0-9  Units Subcutaneous TID WC   isosorbide  mononitrate  30 mg Oral Daily   levothyroxine   25 mcg Oral q morning   QUEtiapine   100 mg Oral QHS   rosuvastatin   10 mg Oral Daily   tamsulosin   0.4 mg Oral QPC supper   Continuous Infusions:  cefTRIAXone  (ROCEPHIN )  IV Stopped (10/22/23 1956)   furosemide  (LASIX ) 200 mg in dextrose  5 % 100 mL (2 mg/mL) infusion 4 mg/hr (10/22/23 2212)     LOS: 1 day  MDM: Patient is high risk for one or more organ failure.  They necessitate ongoing hospitalization for continued IV therapies and subsequent lab monitoring. Total time spent interpreting labs and vitals, reviewing the medical record, coordinating care amongst consultants and care team members, directly assessing and discussing care with the patient and/or family: 55 min  Lex Linhares, DO [facility]  To contact the attending physician between 7A-7P please use Epic Chat. To contact the covering physician during after hours 7P-7A, please review Amion.  10/23/2023, 4:52 PM   *This document has been created with the assistance of dictation software. Please excuse typographical errors. *

## 2023-10-23 NOTE — Progress Notes (Addendum)
 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  Patient sent to the hospital for further evaluation 2 failed attempts to place foley catheter.  Patient has severe pitting edema from his feet to his scrotum that started 3 days ago.  Patient was admitted to [facility] on 8.9.2025 for primary diagnosis of renal failure.  Per Dr. Odella Pepper, hospice MD, this is a related hospitalization.  Dr. 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  Awakes to verbal stimuli.  Patient has 2 of his home caregivers at this bedside.  No family present.  Attempted to have goals of care conversation with him, however, Dr. Monte is not able to participate due to some confusion and AMS.  He does confirm he is a full code, under hospice care and able to name his children with some prompting.  He is disoriented to time and place- but oriented to self, DOB, address.  I called and left a message with daughter, [name] to discuss GOC and to discuss hospice and dialysis should this be the route Dr. Monte and family would want to pursue.  When I asked Dr. Monte about dialysis- he did tell me he would likely want to pursue dialysis.   Vital Signs:  T 98.7 oral, BP 159/84, P 92, R 24 Oxi 94% RA Weight:  276#  I&O:   Intake Cummulative Total : Output Cumulative Total:  Cumulative Net I/O:  -  Abnormal Labs:  K 5.4, CO2 18, glucose 194, BUN 106, Crea 3.97, Ca 8.7, GFR 14, BNP 102.2, RBC 3.42, HGB 10.7, HCT 33.9  Diagnostics:  EXAM: CHEST  1 VIEW   COMPARISON:  Chest x-ray 03/29/2023   FINDINGS: The heart and mediastinal contours are unchanged. Atherosclerotic plaque.   Low lung volumes. No focal consolidation. Slightly increased interstitial markings. No pleural effusion. No pneumothorax.   No acute osseous abnormality.   IMPRESSION: Low lung volumes with possible mild pulmonary  edema. Consider repeat chest x-ray hand lateral view with improved inspiratory effort.    Electronically Signed   By: Morgane  Naveau M.D.   On: 10/22/2023 17:01  EXAM: RENAL / URINARY TRACT ULTRASOUND COMPLETE   COMPARISON:  None Available.   FINDINGS: Right Kidney:   Renal measurements: 10.4 x 8.2 x 6.3 cm volume: 280 mL. Echogenicity within normal limits. No mass or hydronephrosis visualized.   Left Kidney:   Renal measurements: 9.4 x 6.1 x 4.6 cm = volume: 137 mL. Echogenicity within normal limits. No mass or hydronephrosis visualized.   Urinary bladder:   Appears normal for degree of bladder distention.   Other: Markedly limited evaluation due to patient body habitus.   IMPRESSION: Grossly unremarkable renal ultrasound with limited evaluation.     Electronically Signed   By: Morgane  Naveau M.D.   On: 10/22/2023 17:34  IV/PRN Meds:   Rocephin  2g every 24 hours IVPB Lasix  gtt 4mg /hr continuous   Hospital Problem List:  per Dr. Cort Mana H&P 8.9.25 Principal Problem:   AKI (acute kidney injury) (HCC) Active Problems:   Oligouria    AKI on CKD stage IIIB Oliguria Anasarca Acute on chronic HFpEF decompensated -Given the history, suspect ATN, long-term prognosis extremely poor.  Explained to patient and his family/POA and home care team at bedside. - Significant fluid overload - Renal ultrasound is pending to rule out any hydronephrosis - Home palliative care nurse and  ED nurse tried to put the Foley but unsuccessful due to significant swelling of scrotum and fully retracted penis.  Contacted on-call urology Dr. Selma who is working at [facility] OR now and will not able to come in right away to help insert the Foley.  Contact ICU catheter team who is coming to see the patient. - Strict I&O's - Discussed with on-call nephrology, given the fact that the patient is currently fluid overloaded both clinically and image study wise on x-ray, nephrology recommended Lasix   drip. - Long discussion with patient her daughter/POA and home care team at bedside, confirmed that patient still desires full code with full scope of treatment measures including HD.  I went on to explain to patient and his family regarding patient's current condition and comorbidities make him a poor candidate for HD, patient and family expressed understanding and maintained that fullscope measures including HD are desired.  Nephrology made aware.   History of pontine stroke with residual right-sided paresis Embolic stroke -On aspirin  and Eliquis    HTN, uncontrolled - Discontinue amlodipine  due to worsening of peripheral edema - Start hydralazine  and Imdur  regimen - Lasix  as above   IIDM - Hold off glimepiride - SSI   Morbid obesity - BMI= 36  Discharge Planning:  Ongoing  Family Contact:  Kingsley Das, daughter- left a message.  Also left my business card with caregivers at bedside to give to family and to please call with any questions or concerns.  Goals of care:  Clear- Full Code Initiating conversations about next steps under hospice care if Dr. Christel want to pursue dialysis on a long term basis.   Patient had a Palliative Care Consult ordered for this hospitalization, however, since patient is a hospice patient, we are able to have this conversation with family/patient.  We will engage palliative care if needed.  IDT:  updated  Hospice Detailed Summary Report sent for upload into patient's medical records on 8.9.25 by [facility].  Please do not hesitate to call with any hospice related questions and concerns.  Saddie HILARIO Na, RN Nurse Liaision [phone]

## 2023-10-23 NOTE — Progress Notes (Signed)
 [facility] Hospitalized Hospice Patient Note  Follow up from earlier visit with Dr. Perri at bedside. I received a phone call back from [name], patient's daughter.  Goals of care conversation was had reviewing patient's desire to remain a full code.  I also reviewed with [name] hospice philosophy and dialysis if that were to be something patient would require this hospitalization and onging after discharge.  She verbalized understanding- that hospice may not cover dialysis if patient would need on an ongoing basis- but this would need to be discussed with [facility] Medical Director and IDT.  I informed her that Dr. Perri confirmed this morning that he wanted to remain a full code and that if he needed dialysis during this hospitalization- he would likely want to pursue dialysis but would want to have further GOC conversations if it comes to that.  [name] confirmed that she would support her father's decisions as long as he is able to make them.  She shares that she has promised her father she would let him make his medical decisions and support them until a time comes where he is not able to make them.  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  [name] is realistic about her father's declining health but will stay on current more aggressive treatment option until there is nothing medically left to be done.  She is very open to continued conversations regarding Dr. Atha treatment options and plans.  Please do not hesitate to call with any hospice related questions or concerns.  Saddie HILARIO Na, RN Nurse Liaison [phone]

## 2023-10-23 NOTE — Plan of Care (Signed)
   Problem: Coping: Goal: Ability to adjust to condition or change in health will improve Outcome: Progressing

## 2023-10-24 DIAGNOSIS — N179 Acute kidney failure, unspecified: Principal | ICD-10-CM

## 2023-10-24 LAB — BASIC METABOLIC PANEL WITH GFR
Anion gap: 8 (ref 5–15)
BUN: 98 mg/dL — ABNORMAL HIGH (ref 8–23)
CO2: 21 mmol/L — ABNORMAL LOW (ref 22–32)
Calcium: 8.3 mg/dL — ABNORMAL LOW (ref 8.9–10.3)
Chloride: 111 mmol/L (ref 98–111)
Creatinine, Ser: 3.54 mg/dL — ABNORMAL HIGH (ref 0.61–1.24)
GFR, Estimated: 16 mL/min — ABNORMAL LOW (ref >60)
Glucose, Bld: 166 mg/dL — ABNORMAL HIGH (ref 70–99)
Potassium: 4.2 mmol/L (ref 3.5–5.1)
Sodium: 140 mmol/L (ref 135–145)

## 2023-10-24 LAB — URINALYSIS, COMPLETE (UACMP) WITH MICROSCOPIC
Bilirubin Urine: NEGATIVE
Glucose, UA: NEGATIVE mg/dL
Ketones, ur: NEGATIVE mg/dL
Nitrite: NEGATIVE
Protein, ur: NEGATIVE mg/dL
Specific Gravity, Urine: 1.009 (ref 1.005–1.030)
Squamous Epithelial / HPF: 0 /HPF (ref 0–5)
pH: 5 (ref 5.0–8.0)

## 2023-10-24 LAB — GLUCOSE, CAPILLARY
Glucose-Capillary: 151 mg/dL — ABNORMAL HIGH (ref 70–99)
Glucose-Capillary: 196 mg/dL — ABNORMAL HIGH (ref 70–99)
Glucose-Capillary: 160 mg/dL — ABNORMAL HIGH (ref 70–99)

## 2023-10-24 LAB — HEMOGLOBIN A1C
Hgb A1c MFr Bld: 7.8 % — ABNORMAL HIGH (ref 4.8–5.6)
Mean Plasma Glucose: 177 mg/dL

## 2023-10-24 MED ORDER — SODIUM CHLORIDE 0.9 % IV BOLUS
500.0000 mL | Freq: Once | INTRAVENOUS | Status: AC
  Administered 2023-10-24 (×2): 500 mL via INTRAVENOUS

## 2023-10-24 MED ORDER — SENNOSIDES-DOCUSATE SODIUM 8.6-50 MG PO TABS
1.0000 | ORAL_TABLET | Freq: Two times a day (BID) | ORAL | Status: DC
  Administered 2023-10-24 – 2023-10-25 (×4): 1 via ORAL
  Filled 2023-10-24 (×2): qty 1

## 2023-10-24 MED ORDER — LACTULOSE 10 GM/15ML PO SOLN
20.0000 g | Freq: Three times a day (TID) | ORAL | Status: DC
  Administered 2023-10-24 – 2023-10-25 (×6): 20 g via ORAL
  Filled 2023-10-24 (×3): qty 30

## 2023-10-24 NOTE — Progress Notes (Signed)
 PROGRESS NOTE    Troy Mueller  FMW:[id number] DOB: June 11, 1932 DOA: 10/22/2023 PCP: Diedra Lame, MD  Chief Complaint  Patient presents with   Urinary Retention    Hospital Course:  Troy Mueller Mueller Is an 89 year old male with CKD stage IIIb, chronic heart failure with preserved EF, recent pontine CVA with residual right-sided weakness, bedbound at baseline, on Eliquis , hypertension, hyperlipidemia, CAD, diabetes, morbid obesity who was brought in for increasing peripheral edema and decreased urinary output.  Patient does have chronic peripheral edema.  He lives at home with 24/7 home care and daily physical therapy.  Patient has become increasingly edematous outpatient and his home care nurse noted decreasing urinary output.  He was given 10 mg of Lasix  with minimal improvement.  He was brought to the ED.  On arrival to the ED CXR reveals bilateral pulmonary congestion, creatinine of 3.9, and diffuse edema.  He was admitted for further workup. Renal ultrasound was without hydronephrosis but revealed distended bladder.  Multiple attempts were made to place Foley catheter but these were unsuccessful due to significant scrotal edema.  Urology was consulted and placed a 14 French Foley on 8/10. Given his worsening kidney function nephrology was consulted and recommended Lasix  drip.  Patient then had significant 13 L output over the following 24 hours.  Subjective: No acute events overnight.  Some low blood pressures which self resolved this morning.  Lasix  drip has been discontinued.  Patient denies any acute concerns at this time.  No further fevers overnight.  Patient's 2 caregivers and daughter are at bedside at the time of my evaluation.  Objective: Vitals:   10/24/23 0740 10/24/23 0942 10/24/23 1139 10/24/23 1526  BP: (!) 76/41 118/60 (!) 101/58 (!) 117/56  Pulse: (!) 56 68 71 69  Resp: 19  20 20   Temp: 98.2 F (36.8 C)  97.7 F (36.5 C) 98.1 F (36.7 C)  TempSrc:      SpO2:  97%  94% 95%  Weight:      Height:        Intake/Output Summary (Last 24 hours) at 10/24/2023 1622 Last data filed at 10/24/2023 1300 Gross per 24 hour  Intake 1829.83 ml  Output 7901 ml  Net -6071.17 ml   Filed Weights   10/22/23 1328  Weight: 125.2 kg    Examination: General exam: Appears calm and comfortable, NAD  Respiratory system: No work of breathing, symmetric chest wall expansion Cardiovascular system: S1 & S2 heard, RRR.  Gastrointestinal system: Abdomen is nondistended, soft and nontender.  Neuro: Alert.  Slow and slurred speech. Extremities: Symmetric, expected ROM.  1+ pedal edema on lower extremities Skin: No rashes, lesions Psychiatry: Demonstrates appropriate judgement and insight. Mood & affect appropriate for situation.   Assessment & Plan:  Principal Problem:   AKI (acute kidney injury) (HCC) Active Problems:   Oligouria   AKI on CKD stage IIIb Oliguria Anasarca Urinary retention - Baseline creatinine around 2.0, creatinine on arrival 3.9 - Has been on Lasix  drip, diuresed 20 L since arrival. - Have discontinued Lasix  drip now. - Renal ultrasound negative for hydronephrosis - Will monitor for renal recovery off of Lasix  drip.  Unclear how quickly anasarca will recur. - Urology consulted and placed Foley catheter for diffuse scrotal edema - Have discussed with nephrology, at this time patient is not a dialysis candidate  Fever - No leukocytosis - UA/Urine culture pending - CXR on arrival with pulmonary congestion.  Could be masking CAP.  Given patient's bedbound  status he is high risk for atelectasis and superimposed bacterial pneumonia - Currently on ceftriaxone  for presumed UTI.  8/10 added azithromycin  for CAP coverage - No further fever.  Will complete antibiotic course. - Incentive spirometry ordered  History of pontine stroke with residual right-sided paresis - On aspirin  and Eliquis , will continue both - Patient is bedbound at baseline -  Has daily physical therapy at home.  Has 24/7 home health care nurses  Hypertension, uncontrolled - Have discontinued amlodipine  in setting of peripheral edema - Blood pressure is low/normal.  Will discontinue antihypertensives for now monitor BP closely.  Diabetes - Sliding scale insulin  for now, titrate as needed  BMI 36 Class II obesity -- Outpatient follow up for lifestyle modification and risk factor management  Poor prognosis - Given patient's advanced age and comorbidities he has a generally poor prognosis.  Patient is on hospice services at home but remains full code and would like to continue all aggressive care.  Per hospice liaison patient is currently a hospice patient under CVD diagnosis.  I have discussed his care directly with his daughter, Troy Mueller, on the phone today.  She is understanding all of the above.  At this time we will continue to monitor for renal recovery before making final decision on dispo.  DVT prophylaxis: Elqiuis   Code Status: Full Code Disposition:  Inpatient pending clinical resolution  Consultants:  Treatment Team:  Consulting Physician: Troy Donnice SAUNDERS, MD  Procedures:    Antimicrobials:  Anti-infectives (From admission, onward)    Start     Dose/Rate Route Frequency Ordered Stop   10/23/23 1745  azithromycin  (ZITHROMAX ) 500 mg in sodium chloride  0.9 % 250 mL IVPB        500 mg 250 mL/hr over 60 Minutes Intravenous Every 24 hours 10/23/23 1656 10/28/23 1744   10/22/23 1815  cefTRIAXone  (ROCEPHIN ) 2 g in sodium chloride  0.9 % 100 mL IVPB        2 g 200 mL/hr over 30 Minutes Intravenous Every 24 hours 10/22/23 1813         Data Reviewed: I have personally reviewed following labs and imaging studies CBC: Recent Labs  Lab 10/22/23 1327 10/23/23 0418  WBC 9.2 7.0  HGB 10.7* 9.5*  HCT 33.9* 30.2*  MCV 99.1 98.1  PLT 175 147*   Basic Metabolic Panel: Recent Labs  Lab 10/22/23 1327 10/23/23 0418 10/24/23 0408  NA 136 139 140  K  5.4* 5.2* 4.2  CL 106 109 111  CO2 18* 18* 21*  GLUCOSE 194* 164* 166*  BUN 106* 101* 98*  CREATININE 3.97* 3.89* 3.54*  CALCIUM  8.7* 8.7* 8.3*   GFR: Estimated Creatinine Clearance: 19.6 mL/min (A) (by C-G formula based on SCr of 3.54 mg/dL (H)). Liver Function Tests: No results for input(s): AST, ALT, ALKPHOS, BILITOT, PROT, ALBUMIN in the last 168 hours. CBG: Recent Labs  Lab 10/23/23 1625 10/23/23 2200 10/24/23 0700 10/24/23 1110 10/24/23 1522  GLUCAP 223* 197* 151* 196* 160*    No results found for this or any previous visit (from the past 240 hours).   Radiology Studies: US  Renal Result Date: 10/22/2023 CLINICAL DATA:  Renal failure, anuric EXAM: RENAL / URINARY TRACT ULTRASOUND COMPLETE COMPARISON:  None Available. FINDINGS: Right Kidney: Renal measurements: 10.4 x 8.2 x 6.3 cm volume: 280 mL. Echogenicity within normal limits. No mass or hydronephrosis visualized. Left Kidney: Renal measurements: 9.4 x 6.1 x 4.6 cm = volume: 137 mL. Echogenicity within normal limits. No mass or hydronephrosis visualized.  Urinary bladder: Appears normal for degree of bladder distention. Other: Markedly limited evaluation due to patient body habitus. IMPRESSION: Grossly unremarkable renal ultrasound with limited evaluation. Electronically Signed   By: Morgane  Naveau M.D.   On: 10/22/2023 17:34   DG Chest 1 View Result Date: 10/22/2023 CLINICAL DATA:  02706 CHF (congestive heart failure) (HCC) 97293 EXAM: CHEST  1 VIEW COMPARISON:  Chest x-ray 03/29/2023 FINDINGS: The heart and mediastinal contours are unchanged. Atherosclerotic plaque. Low lung volumes. No focal consolidation. Slightly increased interstitial markings. No pleural effusion. No pneumothorax. No acute osseous abnormality. IMPRESSION: Low lung volumes with possible mild pulmonary edema. Consider repeat chest x-ray hand lateral view with improved inspiratory effort. Electronically Signed   By: Morgane  Naveau M.D.   On:  10/22/2023 17:01    Scheduled Meds:  apixaban   2.5 mg Oral BID   aspirin   81 mg Oral Daily   Chlorhexidine  Gluconate Cloth  6 each Topical Daily   ezetimibe   10 mg Oral Daily   fenofibrate   54 mg Oral Daily   insulin  aspart  0-9 Units Subcutaneous TID WC   lactulose   20 g Oral TID   levothyroxine   25 mcg Oral q morning   QUEtiapine   100 mg Oral QHS   rosuvastatin   10 mg Oral Daily   senna-docusate  1 tablet Oral BID   tamsulosin   0.4 mg Oral QPC supper   Continuous Infusions:  azithromycin  500 mg (10/23/23 2017)   cefTRIAXone  (ROCEPHIN )  IV 2 g (10/23/23 1859)     LOS: 2 days  MDM: Patient is high risk for one or more organ failure.  They necessitate ongoing hospitalization for continued IV therapies and subsequent lab monitoring. Total time spent interpreting labs and vitals, reviewing the medical record, coordinating care amongst consultants and care team members, directly assessing and discussing care with the patient and/or family: 55 min  Troy Gaultney, DO [facility]  To contact the attending physician between 7A-7P please use Epic Chat. To contact the covering physician during after hours 7P-7A, please review Amion.  10/24/2023, 4:22 PM   *This document has been created with the assistance of dictation software. Please excuse typographical errors. *

## 2023-10-24 NOTE — TOC Initial Note (Signed)
 Transition of Care [facility]) - Initial/Assessment Note    Patient Details  Name: Troy Mueller MRN: [id number] Date of Birth: 1932-10-12  Transition of Care [facility]) CM/SW Contact:    Lauraine JAYSON Carpen, LCSW Phone Number: 10/24/2023, 12:13 PM  Clinical Narrative:  Readmission prevention screen complete. Patient not fully oriented. Daughter and two caregivers at bedside. CSW introduced role and explained that discharge planning would be discussed. PCP is Jerona Sayre, MD. Patient is unable to get to appointments so they provide telehealth visits when needed. He uses [facility]. No issues obtaining medications. Patient lives alone but has 24/7 caregivers. Patient is active with [facility] for home hospice services. Daughter also stated that he is getting PT, OT, and speech therapy through [facility]. CSW left voicemail for [facility] liaison. Patient has a bipap, hospital bed, newstep, hoyer lift, and wheelchair at home. Patient will need ambulance transport home. Address on facesheet is correct. No further concerns. CSW will continue to follow patient and his family for support and facilitate return home once stable.   Expected Discharge Plan: Home w Home Health Services (and hospice) Barriers to Discharge: Continued Medical Work up   Patient Goals and CMS Choice            Expected Discharge Plan and Services     Post Acute Care Choice: Resumption of Svcs/PTA Provider Living arrangements for the past 2 months: Single Family Home                                      Prior Living Arrangements/Services Living arrangements for the past 2 months: Single Family Home Lives with:: Self Patient language and need for interpreter reviewed:: Yes Do you feel safe going back to the place where you live?: Yes      Need for Family Participation in Patient Care: Yes (Comment) Care giver support system in place?: Yes (comment) Current home services: DME, Home OT, Home PT,  Other (comment) (Home speech therapy, 24/7 caregivers) Criminal Activity/Legal Involvement Pertinent to Current Situation/Hospitalization: No - Comment as needed  Activities of Daily Living      Permission Sought/Granted Permission sought to share information with : Family Supports    Share Information with NAME: [name] Cathlean Das  Permission granted to share info w AGENCY: [place], [phone]  Permission granted to share info w Relationship: Daughter  Permission granted to share info w Contact Information: [phone]  Emotional Assessment Appearance:: Appears stated age Attitude/Demeanor/Rapport: Engaged Affect (typically observed): Unable to Assess Orientation: : Oriented to Self, Oriented to Place Alcohol  / Substance Use: Not Applicable Psych Involvement: No (comment)  Admission diagnosis:  AKI (acute kidney injury) (HCC) [N17.9] Acute renal failure, unspecified acute renal failure type (HCC) [N17.9] Patient Active Problem List   Diagnosis Date Noted   Oligouria 10/22/2023   Dysphagia 03/30/2023   Acute cerebral infarction (HCC) 03/26/2023   Type 2 diabetes mellitus (HCC) 03/26/2023   AKI (acute kidney injury) (HCC) 03/26/2023   OSA (obstructive sleep apnea) 01/13/2023   Acute on chronic respiratory failure with hypoxia and hypercapnia (HCC) 01/13/2023   Acute respiratory failure with hypoxia and hypercapnia (HCC) 01/12/2023   Dyslipidemia 01/12/2023   Essential hypertension 01/12/2023   Hypothyroidism 01/12/2023   CAP (community acquired pneumonia) 01/05/2023   Acute hypoxic on chronic hypercapnic respiratory failure (HCC) 01/01/2023   SDH (subdural hematoma) (HCC) 01/20/2022   Fall at home, initial encounter 01/20/2022  Hyperkalemia 01/20/2022   Acute metabolic encephalopathy 01/20/2022   DVT (deep venous thrombosis) (HCC) 01/20/2022   HTN (hypertension) 01/20/2022   Chronic kidney disease, stage 3b (HCC) 01/20/2022   Type II diabetes mellitus with renal  manifestations (HCC) 01/20/2022   Atrial fibrillation (HCC) 01/20/2022   HLD (hyperlipidemia) 01/20/2022   Obesity (BMI 30-39.9) 01/20/2022   PCP:  Diedra Lame, MD Pharmacy:   [facility] [place], [place] - [address] [place] [address] Phone: [phone] Fax: [phone]  [facility] [address] [place] [address] Phone: [phone] Fax: [phone]     Social Drivers of Health (SDOH) Social History: SDOH Screenings   Food Insecurity: No Food Insecurity (03/27/2023)  Housing: Low Risk  (03/27/2023)  Transportation Needs: No Transportation Needs (03/27/2023)  Utilities: Not At Risk (03/27/2023)  Financial Resource Strain: Low Risk  (08/05/2022)   Received from [facility]  Social Connections: Moderately Integrated (03/27/2023)  Tobacco Use: Low Risk  (10/22/2023)   SDOH Interventions:     Readmission Risk Interventions    10/24/2023   12:10 PM  Readmission Risk Prevention Plan  Transportation Screening Complete  PCP or Specialist Appt within 3-5 Days Complete  HRI or Home Care Consult Complete  Social Work Consult for Recovery Care Planning/Counseling Complete  Palliative Care Screening Complete  Medication Review Oceanographer) Complete

## 2023-10-24 NOTE — Care Management Important Message (Signed)
 Important Message  Patient Details  Name: Troy Mueller MRN: [id number] Date of Birth: 1932/12/25   Important Message Given:  Yes - Medicare IM     Rojelio SHAUNNA Rattler 10/24/2023, 12:38 PM

## 2023-10-25 ENCOUNTER — Other Ambulatory Visit (HOSPITAL_COMMUNITY): Payer: Self-pay

## 2023-10-25 ENCOUNTER — Other Ambulatory Visit: Payer: Self-pay

## 2023-10-25 DIAGNOSIS — N179 Acute kidney failure, unspecified: Secondary | ICD-10-CM | POA: Diagnosis not present

## 2023-10-25 LAB — CBC WITH DIFFERENTIAL/PLATELET
Abs Immature Granulocytes: 0.03 K/uL (ref 0.00–0.07)
Basophils Absolute: 0 K/uL (ref 0.0–0.1)
Basophils Relative: 1 %
Eosinophils Absolute: 0.2 K/uL (ref 0.0–0.5)
Eosinophils Relative: 4 %
HCT: 29.2 % — ABNORMAL LOW (ref 39.0–52.0)
Hemoglobin: 9.2 g/dL — ABNORMAL LOW (ref 13.0–17.0)
Immature Granulocytes: 1 %
Lymphocytes Relative: 13 %
Lymphs Abs: 0.8 K/uL (ref 0.7–4.0)
MCH: 30.6 pg (ref 26.0–34.0)
MCHC: 31.5 g/dL (ref 30.0–36.0)
MCV: 97 fL (ref 80.0–100.0)
Monocytes Absolute: 0.7 K/uL (ref 0.1–1.0)
Monocytes Relative: 11 %
Neutro Abs: 4.5 K/uL (ref 1.7–7.7)
Neutrophils Relative %: 70 %
Platelets: 137 K/uL — ABNORMAL LOW (ref 150–400)
RBC: 3.01 MIL/uL — ABNORMAL LOW (ref 4.22–5.81)
RDW: 13.1 % (ref 11.5–15.5)
WBC: 6.3 K/uL (ref 4.0–10.5)
nRBC: 0 % (ref 0.0–0.2)

## 2023-10-25 LAB — GLUCOSE, CAPILLARY
Glucose-Capillary: 180 mg/dL — ABNORMAL HIGH (ref 70–99)
Glucose-Capillary: 208 mg/dL — ABNORMAL HIGH (ref 70–99)

## 2023-10-25 LAB — COMPREHENSIVE METABOLIC PANEL WITH GFR
ALT: 20 U/L (ref 0–44)
AST: 30 U/L (ref 15–41)
Albumin: 3.1 g/dL — ABNORMAL LOW (ref 3.5–5.0)
Alkaline Phosphatase: 31 U/L — ABNORMAL LOW (ref 38–126)
Anion gap: 14 (ref 5–15)
BUN: 85 mg/dL — ABNORMAL HIGH (ref 8–23)
CO2: 22 mmol/L (ref 22–32)
Calcium: 8.9 mg/dL (ref 8.9–10.3)
Chloride: 107 mmol/L (ref 98–111)
Creatinine, Ser: 2.93 mg/dL — ABNORMAL HIGH (ref 0.61–1.24)
GFR, Estimated: 20 mL/min — ABNORMAL LOW (ref >60)
Glucose, Bld: 144 mg/dL — ABNORMAL HIGH (ref 70–99)
Potassium: 4.2 mmol/L (ref 3.5–5.1)
Sodium: 143 mmol/L (ref 135–145)
Total Bilirubin: 0.5 mg/dL (ref 0.0–1.2)
Total Protein: 6.3 g/dL — ABNORMAL LOW (ref 6.5–8.1)

## 2023-10-25 LAB — PHOSPHORUS: Phosphorus: 4.8 mg/dL — ABNORMAL HIGH (ref 2.5–4.6)

## 2023-10-25 LAB — MAGNESIUM: Magnesium: 2 mg/dL (ref 1.7–2.4)

## 2023-10-25 MED ORDER — LACTULOSE 10 GM/15ML PO SOLN
10.0000 g | Freq: Two times a day (BID) | ORAL | 0 refills | Status: AC | PRN
  Filled 2023-10-25: qty 236, 8d supply, fill #0

## 2023-10-25 MED ORDER — FUROSEMIDE 20 MG PO TABS
40.0000 mg | ORAL_TABLET | Freq: Every day | ORAL | 11 refills | Status: AC | PRN
  Filled 2023-10-25: qty 30, 15d supply, fill #0

## 2023-10-25 MED ORDER — AMOXICILLIN-POT CLAVULANATE 600-42.9 MG/5ML PO SUSR
500.0000 mg | Freq: Two times a day (BID) | ORAL | 0 refills | Status: AC
  Filled 2023-10-25: qty 75, 4d supply, fill #0

## 2023-10-25 MED ORDER — TAMSULOSIN HCL 0.4 MG PO CAPS
0.4000 mg | ORAL_CAPSULE | Freq: Every day | ORAL | 0 refills | Status: AC
  Filled 2023-10-25: qty 30, 30d supply, fill #0

## 2023-10-25 MED ORDER — LANCET DEVICE MISC
1.0000 | Freq: Three times a day (TID) | 0 refills | Status: AC
  Filled 2023-10-25: qty 1, fill #0

## 2023-10-25 MED ORDER — LANCETS MISC
1.0000 | Freq: Three times a day (TID) | 0 refills | Status: AC
  Filled 2023-10-25: qty 100, 33d supply, fill #0

## 2023-10-25 MED ORDER — BLOOD GLUCOSE TEST VI STRP
1.0000 | ORAL_STRIP | Freq: Three times a day (TID) | 0 refills | Status: AC
  Filled 2023-10-25: qty 100, 34d supply, fill #0

## 2023-10-25 MED ORDER — INSULIN ASPART (W/NIACINAMIDE) 100 UNIT/ML ~~LOC~~ SOPN
0.0000 [IU] | PEN_INJECTOR | Freq: Three times a day (TID) | SUBCUTANEOUS | 3 refills | Status: AC
  Filled 2023-10-25: qty 3, 11d supply, fill #0

## 2023-10-25 MED ORDER — GLUCAGON EMERGENCY 1 MG IJ KIT
1.0000 mg | PACK | INTRAMUSCULAR | 0 refills | Status: AC | PRN
  Filled 2023-10-25: qty 1, 2d supply, fill #0

## 2023-10-25 MED ORDER — BLOOD GLUCOSE MONITOR SYSTEM W/DEVICE KIT
1.0000 | PACK | Freq: Three times a day (TID) | 0 refills | Status: AC
Start: 2023-10-25 — End: ?
  Filled 2023-10-25: qty 1, 30d supply, fill #0

## 2023-10-25 MED ORDER — INSULIN PEN NEEDLE 32G X 6 MM MISC
1.0000 | Freq: Three times a day (TID) | 0 refills | Status: AC
  Filled 2023-10-25: qty 100, 33d supply, fill #0

## 2023-10-25 NOTE — TOC Transition Note (Signed)
 Transition of Care [facility]) - Discharge Note   Patient Details  Name: Troy Mueller MRN: [id number] Date of Birth: 1932-12-16  Transition of Care [facility]) CM/SW Contact:  Lauraine JAYSON Carpen, LCSW Phone Number: 10/25/2023, 2:35 PM   Clinical Narrative:   Patient has orders to discharge home today. [facility] and [facility] are aware. [facility] has been arranged and he is 3rd on the list. No further concerns. CSW signing off.  Final next level of care: Home w Hospice Care Barriers to Discharge: Barriers Resolved   Patient Goals and CMS Choice     Choice offered to / list presented to : Patient, Adult Children      Discharge Placement                Patient to be transferred to facility by: [facility] Name of family member notified: Kingsley Das Patient and family notified of of transfer: 10/25/23  Discharge Plan and Services Additional resources added to the After Visit Summary for       Post Acute Care Choice: Resumption of Svcs/PTA Provider                    HH Arranged: PT, OT, Speech Therapy HH Agency: [facility] Date [facility] Agency Contacted: 10/25/23   Representative spoke with at [facility] Agency: Amy  Social Drivers of Health (SDOH) Interventions SDOH Screenings   Food Insecurity: No Food Insecurity (03/27/2023)  Housing: Low Risk  (03/27/2023)  Transportation Needs: No Transportation Needs (03/27/2023)  Utilities: Not At Risk (03/27/2023)  Financial Resource Strain: Low Risk  (08/05/2022)   Received from [facility]  Social Connections: Moderately Integrated (03/27/2023)  Tobacco Use: Low Risk  (10/22/2023)     Readmission Risk Interventions    10/24/2023   12:10 PM  Readmission Risk Prevention Plan  Transportation Screening Complete  PCP or Specialist Appt within 3-5 Days Complete  HRI or Home Care Consult Complete  Social Work Consult for Recovery Care Planning/Counseling Complete   Palliative Care Screening Complete  Medication Review Oceanographer) Complete

## 2023-10-25 NOTE — Progress Notes (Signed)
 AVS given to caregiver and copy placed in chart. PIV removed. All questions answered. EMS to transport patient home.

## 2023-10-25 NOTE — TOC Progression Note (Addendum)
 Transition of Care [facility]) - Progression Note    Patient Details  Name: HRIDAY STAI MRN: [id number] Date of Birth: 1932/09/06  Transition of Care [facility]) CM/SW Contact  Lauraine JAYSON Carpen, LCSW Phone Number: 10/25/2023, 10:08 AM  Clinical Narrative: CSW spoke to [facility] liaison. She is checking patient's active services.    11:02 am: [facility] confirmed PT, OT, ST.  Expected Discharge Plan: Home w Home Health Services (and hospice) Barriers to Discharge: Continued Medical Work up               Expected Discharge Plan and Services     Post Acute Care Choice: Resumption of Svcs/PTA Provider Living arrangements for the past 2 months: Single Family Home                                       Social Drivers of Health (SDOH) Interventions SDOH Screenings   Food Insecurity: No Food Insecurity (03/27/2023)  Housing: Low Risk  (03/27/2023)  Transportation Needs: No Transportation Needs (03/27/2023)  Utilities: Not At Risk (03/27/2023)  Financial Resource Strain: Low Risk  (08/05/2022)   Received from [facility]  Social Connections: Moderately Integrated (03/27/2023)  Tobacco Use: Low Risk  (10/22/2023)    Readmission Risk Interventions    10/24/2023   12:10 PM  Readmission Risk Prevention Plan  Transportation Screening Complete  PCP or Specialist Appt within 3-5 Days Complete  HRI or Home Care Consult Complete  Social Work Consult for Recovery Care Planning/Counseling Complete  Palliative Care Screening Complete  Medication Review Oceanographer) Complete

## 2023-10-25 NOTE — Progress Notes (Addendum)
 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  Patient sent to the hospital for further evaluation 2 failed attempts to place foley catheter.  Patient has severe pitting edema from his feet to his scrotum that started 3 days ago.  Patient was admitted to [facility] on 8.9.2025 for primary diagnosis of renal failure.  Per Dr. Odella Pepper, hospice MD, this is a related hospitalization.   Dr. Monte was lying in bed, awake and alert.  He stated that he is feeling "ok".  Caregiver and RN were in the room with patient, as well.  Patient's daughter, Kingsley Das, stated that the patient appears to be better today.     Vital Signs:  T97.7  oral, BP 101/58 , P 71, R 20  Oxi 94% RA Weight:  276#   I&O:  640/800   Abnormal Labs:  CO2 21, Glucose 166, BUN 88, Creatinine 3.54, Calcium  8.3, GFR 16   Diagnostics: none new   IV/PRN Meds:   Zithromax  500mg  q 24 hours IVPB Rocephin  2g every 24 hours IVPB Lasix  gtt 4mg /hr continuous     Hospital Problem List:  per Dr. Lorane Poland 8.11.25 Principal Problem:   AKI (acute kidney injury) (HCC) Active Problems:   Oligouria     AKI on CKD stage IIIb Oliguria Anasarca Urinary retention - Baseline creatinine around 2.0, creatinine on arrival 3.9 - Has been on Lasix  drip, diuresed 20 L since arrival. - Have discontinued Lasix  drip now. - Renal ultrasound negative for hydronephrosis - Will monitor for renal recovery off of Lasix  drip.  Unclear how quickly anasarca will recur. - Urology consulted and placed Foley catheter for diffuse scrotal edema - Have discussed with nephrology, at this time patient is not a dialysis candidate   Fever - No leukocytosis - UA/Urine culture pending - CXR on arrival with pulmonary congestion.  Could be masking CAP.  Given patient's bedbound status he is high risk for atelectasis and superimposed  bacterial pneumonia - Currently on ceftriaxone  for presumed UTI.  8/10 added azithromycin  for CAP coverage - No further fever.  Will complete antibiotic course. - Incentive spirometry ordered   History of pontine stroke with residual right-sided paresis - On aspirin  and Eliquis , will continue both - Patient is bedbound at baseline - Has daily physical therapy at home.  Has 24/7 home health care nurses   Hypertension, uncontrolled - Have discontinued amlodipine  in setting of peripheral edema - Blood pressure is low/normal.  Will discontinue antihypertensives for now monitor BP closely.   Diabetes - Sliding scale insulin  for now, titrate as needed   BMI 36 Class II obesity -- Outpatient follow up for lifestyle modification and risk factor management   Poor prognosis - Given patient's advanced age and comorbidities he has a generally poor prognosis.  Patient is on hospice services at home but remains full code and would like to continue all aggressive care.  Per hospice liaison patient is currently a hospice patient under CVD diagnosis.  I have discussed his care directly with his daughter, Audery, on the phone today.  She is understanding all of the above.  At this time we will continue to monitor for renal recovery before making final decision on dispo.   DVT prophylaxis: Elqiuis   Code Status: Full Code Disposition:  Inpatient pending clinical resolution    Discharge Planning:  Ongoing.  DC anticipated in the  next day or two.     Family Contact: Spoke with daughter, [name]. Visited patient at bedside.     Goals of care:  Clear- Full Code Patient had a Palliative Care Consult ordered for this hospitalization, however, since patient is a hospice patient, we are able to have this conversation with family/patient.  We will engage palliative care if needed.   IDT:  updated   Hospice Detailed Summary Report sent for upload into patient's medical records on 8.9.25.     Please do not  hesitate to call with any hospice related questions and concerns.  [facility] Liaison [phone]

## 2023-10-25 NOTE — Plan of Care (Signed)
  Problem: Education: Goal: Ability to describe self-care measures that may prevent or decrease complications (Diabetes Survival Skills Education) will improve Outcome: Progressing Goal: Individualized Educational Video(s) Outcome: Progressing   Problem: Coping: Goal: Ability to adjust to condition or change in health will improve Outcome: Progressing   Problem: Fluid Volume: Goal: Ability to maintain a balanced intake and output will improve Outcome: Progressing   Problem: Metabolic: Goal: Ability to maintain appropriate glucose levels will improve Outcome: Progressing   Problem: Nutritional: Goal: Maintenance of adequate nutrition will improve Outcome: Progressing Goal: Progress toward achieving an optimal weight will improve Outcome: Progressing   Problem: Skin Integrity: Goal: Risk for impaired skin integrity will decrease Outcome: Progressing   Problem: Tissue Perfusion: Goal: Adequacy of tissue perfusion will improve Outcome: Progressing   Problem: Health Behavior/Discharge Planning: Goal: Ability to manage health-related needs will improve Outcome: Progressing   Problem: Activity: Goal: Risk for activity intolerance will decrease Outcome: Progressing   Problem: Nutrition: Goal: Adequate nutrition will be maintained Outcome: Progressing   Problem: Coping: Goal: Level of anxiety will decrease Outcome: Progressing   Problem: Elimination: Goal: Will not experience complications related to bowel motility Outcome: Progressing Goal: Will not experience complications related to urinary retention Outcome: Progressing   Problem: Skin Integrity: Goal: Risk for impaired skin integrity will decrease Outcome: Progressing

## 2023-10-25 NOTE — Discharge Summary (Signed)
 DISCHARGE SUMMARY    Troy Mueller FMW:[id number] DOB: [birth year] DOA: 10/22/2023  PCP: Diedra Lame, MD  Admit date: 10/22/2023 Discharge date: 10/25/2023   Recommendations for Outpatient Follow-up:  Follow closely with outpatient PCP for repeat creatinine and continued close monitoring of renal function status. Outpatient urology referral for trial of void in clinic as desired Follow-up with PCP for continued diabetes management and insulin  titration.  Hospital Course: Troy Mueller Is an 89 year old male with CKD stage IIIb, chronic heart failure with preserved EF, recent pontine CVA with residual right-sided weakness, bedbound at baseline, on Eliquis , hypertension, hyperlipidemia, CAD, diabetes, morbid obesity who was brought in for increasing peripheral edema and decreased urinary output.  Patient does have chronic peripheral edema.  He lives at home with 24/7 home care and daily physical therapy.  Patient has become increasingly edematous outpatient and his home care nurse noted decreasing urinary output.  He was given 10 mg of Lasix  with minimal improvement.  He was brought to the ED.  On arrival to the ED CXR reveals bilateral pulmonary congestion, creatinine of 3.9, and diffuse edema.  He was admitted for further workup. Renal ultrasound was without hydronephrosis but revealed distended bladder.  Multiple attempts were made to place Foley catheter but these were unsuccessful due to significant scrotal edema.  Urology was consulted and placed a 14 French Foley on 8/10. Given his worsening kidney function nephrology was consulted and recommended Lasix  drip.  Patient then had significant 22L output over the following 24 hours.  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  Unclear how quickly anasarca will recur - Urology was consulted and placed Foley catheter given diffuse scrotal edema - Discussed with nephrology who reports that this patient is not a dialysis candidate - Given slow renal recovery we will plan for continued monitoring outpatient.  He will need to follow-up for repeat creatinine check with PCP in 1 week. -We did discuss renal diet, provided resources at discharge to review  Oliguria Anasarca Urinary retention -Foley catheter placed during this admission for acute urinary retention secondary to penile and scrotal edema.  Had extensive discussion with the patient's care team regarding trial of void while admitted or keeping Foley catheter at discharge.  They have agreed to keep Foley catheter for now.  They will consider a trial of void outpatient after it is determined if patient's swelling recurs - I discussed directly with caregivers at bedside who assure they are comfortable with Foley catheter management.  Referral placed to urology for trial of void in clinic. -We will proceed with as needed Lasix  outpatient.  I discussed how to administer PRN with his caregivers.  Given his CKD we will avoid daily Lasix  at this time unless it is determined that he requires at least mild diuretic therapy on a daily basis  Fever - No leukocytosis - UA/Urine culture pending - CXR on arrival with pulmonary congestion.  Could be masking CAP.  Given patient's bedbound status he is high risk for atelectasis and superimposed bacterial pneumonia - Currently on ceftriaxone  for presumed UTI.  8/10 added azithromycin  for CAP coverage - Complete antibiotic course with Augmentin  at discharge. - Cont incentive spirometry  History of pontine  stroke with residual right-sided paresis - On aspirin  and Eliquis , will continue both - Patient is bedbound at baseline - Has  daily physical therapy at home.  Has 24/7 home healthcare nurses   Hypertension, uncontrolled - Blood pressure low during this admission.  Antihypertensives were discontinued to allow for greater diuresis - As needed Lasix  prescribed at discharge.  Recommending we hold all antihypertensives for now.  Advised caregivers to take blood pressure on a daily basis and slowly resume antihypertensives as needed.   Diabetes - Hemoglobin A1c 7.8%.  Was taking glipizide  outpatient.  Given his CKD and inconsistent p.o. intake he has high risk for hypoglycemia.  We have discussed insulin  and his caregivers feel more comfortable with as needed insulin .  I have provided and reviewed a sliding scale.  Start with mealtime coverage for now, may require long acting at later time once PO intake is consistent.   BMI 36 Class II obesity -- Outpatient follow up for lifestyle modification and risk factor management   Poor prognosis - Given patient's advanced age and comorbidities he has a generally poor prognosis.  Patient is on hospice services at home but remains full code and would like to continue all aggressive care.  Per hospice liaison patient is currently a hospice patient under CVD diagnosis.  I have discussed his care directly with his daughter, Audery, and his multiple home health nurses.  We discussed that despite our interventions his CKD is likely to continue to progress and he is not a hemodialysis candidate.  They are aware this is ultimately a terminal diagnosis but would like to continue all aggressive care in hopes of slowing the disease process.  The patient's goal is to make it to 89 years old.  Home health PT/OT/SLP ordered at discharge and arranged by [facility]   Discharge Instructions  Discharge Instructions     Ambulatory referral to Urology   Complete by: As directed    Trial of Void   Call MD for:  difficulty breathing, headache or visual disturbances   Complete by: As directed    Call MD for:   persistant dizziness or light-headedness   Complete by: As directed    Call MD for:  persistant nausea and vomiting   Complete by: As directed    Call MD for:  severe uncontrolled pain   Complete by: As directed    Call MD for:  temperature >100.4   Complete by: As directed    Diet general   Complete by: As directed    Discharge instructions   Complete by: As directed    We have made multiple medication changes.  Please follow your discharge medication list closely and ensure you are taking the right medications when you get home.  We have discontinued your diabetes medication, glipizide , as you are high risk for hypoglycemia on this medication.  We have started a low-dose sliding scale insulin  called aspart.  Do not take this medication if you do not eat.  Take your blood sugar before each meal and respond appropriately per the sliding scale below.  The sliding scale is also listed on the insulin  prescription.  You have not been started on a long-acting insulin  at this time though it would benefit you to do so in the future.  Keep a blood sugar log and follow-up with your primary care physician to review the blood sugar log and your insulin  needs to better manage your diabetes outpatient. If your blood sugar is low please drink a  small cup of juice, if the blood sugar is low and you are altered or unable to tolerate p.o. please proceed with the glucagon  as prescribed.  CBG < 70: Implement Hypoglycemia plan CBG 70 - 120: 0 units CBG 121 - 150: 1 unit CBG 151 - 200: 2 units CBG 201 - 250: 3 units CBG 251 - 300: 5 units CBG 301 - 350: 7 units CBG 351 - 400: 9 units    At this time your blood pressure is too low to consistenly take blood pressure medications.  This is likely because of the diuretic we have been giving you.  I have called in a prescription for Lasix .  The prescription is for 20 mg.  If you notice that you are becoming volume overloaded (worsening leg edema, worsening scrotal  edema, weight gain please proceed with a 40 mg dose (2 pills). If you have a high salt meal, you may anticipate that the swelling could get worse afterwards, you may prophylactically take a 40 mg dose of Lasix .  Lasix  can be hard on your kidneys, it is best to avoid daily dosing unless you notice that your swelling is worse without it.  In that case, we can consider a daily dose of Lasix .  Please follow-up with your primary care physician to discuss this.  Keep your Foley catheter in place, it was placed on 8/9.  You should clean the tip of the Foley catheter at the penis anytime you are being changed, please pay special attention if having had a bowel movement.  This Foley catheter can remain in place until September 9.  It should be exchanged at that time.  A referral has been placed to urology so you may follow-up in their Foley catheter clinic.  You have been started on a medication called Flomax  (tamsulosin ).  I recommend you keep taking this medication as long as you have the Foley catheter in place and can consider discontinuation 2 weeks after the Foley catheter has been removed.   Currently your blood pressure is too low to take blood pressure medications.  Keep a blood pressure log at home.  If you notice that your blood pressure begins rising again you may gradually resume your blood pressure medications.  You may start with amlodipine  5 mg.  You have room to go on this medication if we need to increase this dose at a later time   Face-to-face encounter (required for Medicare/Medicaid patients)   Complete by: As directed    I Lorane Poland certify that this patient is under my care and that I, or a nurse practitioner or physician's assistant working with me, had a face-to-face encounter that meets the physician face-to-face encounter requirements with this patient on 10/25/2023. The encounter with the patient was in whole, or in part for the following medical condition(s) which is the primary  reason for home health care (List medical condition): CKD stage IIIb, chronic heart failure with preserved EF, recent pontine CVA with residual right-sided weakness, bedbound at baseline, on Eliquis , hypertension, hyperlipidemia, CAD, diabetes, morbid obesity   The encounter with the patient was in whole, or in part, for the following medical condition, which is the primary reason for home health care: CKD stage IIIb, chronic heart failure with preserved EF, recent pontine CVA with residual right-sided weakness, bedbound at baseline, on Eliquis , hypertension, hyperlipidemia, CAD, diabetes, morbid obesity   I certify that, based on my findings, the following services are medically necessary home health services: Physical therapy  Reason for Medically Necessary Home Health Services:  Therapy- Home Adaptation to Facilitate Safety Therapy- Instruction on Safe use of Assistive Devices for ADLs Therapy- Therapeutic Exercises to Increase Strength and Endurance Therapy- Skilled Evaluation of Speech Comprehension and Safe Swallowing     My clinical findings support the need for the above services:  Unable to leave home safely without assistance and/or assistive device Bedbound     Further, I certify that my clinical findings support that this patient is homebound due to:  Shortness of Breath with activity Unable to leave home safely without assistance     Home Health   Complete by: As directed    To provide the following care/treatments:  PT OT SLP     Increase activity slowly   Complete by: As directed    No wound care   Complete by: As directed       Allergies as of 10/25/2023       Reactions   Tylenol  [acetaminophen ]    Rash        Medication List     STOP taking these medications    amLODipine  5 MG tablet Commonly known as: NORVASC    gabapentin 300 MG capsule Commonly known as: NEURONTIN   glipiZIDE  5 MG 24 hr tablet Commonly known as: GLUCOTROL  XL   omega-3 acid ethyl  esters 1 g capsule Commonly known as: LOVAZA    polyethylene glycol powder 17 GM/SCOOP powder Commonly known as: GLYCOLAX /MIRALAX    ramipril 2.5 MG capsule Commonly known as: ALTACE       TAKE these medications    Accu-Chek Guide Test test strip Generic drug: glucose blood Use 3 (three) times daily. Use as directed to check blood sugar.   Accu-Chek Guide w/Device Kit Use 3 (three) times daily to check blood sugar   Accu-Chek Softclix Lancets lancets Use 3 (three) times daily. Use as directed to check blood sugar.   amoxicillin -clavulanate 600-42.9 MG/5ML suspension Commonly known as: AUGMENTIN  Take 4.2 mLs (500 mg total) by mouth 2 (two) times daily for 4 days. Discard remaining medication.   artificial tears ophthalmic solution Place 1 drop into both eyes as needed for dry eyes.   Aspirin  Low Dose 81 MG chewable tablet Generic drug: aspirin  Chew 1 tablet (81 mg total) by mouth daily. Take until 06/24/2022.   Eliquis  2.5 MG Tabs tablet Generic drug: apixaban  Take 1 tablet (2.5 mg total) by mouth 2 (two) times daily.   ezetimibe  10 MG tablet Commonly known as: ZETIA  Take 10 mg by mouth daily.   feeding supplement Liqd Take 237 mLs by mouth 2 (two) times daily between meals.   fenofibrate  54 MG tablet Take 54 mg by mouth daily.   Fiasp  FlexTouch 100 UNIT/ML FlexTouch Pen Generic drug: insulin  aspart Inject 0-9 Units into the skin 3 (three) times daily with meals. CBG < 70: Implement Hypoglycemia protocol CBG 70 - 120: 0 units, CBG 121 - 150: 1 unit, CBG 151 - 200: 2 units CBG 201 - 250: 3 units, CBG 251 - 300: 5 units CBG 301 - 350: 7 units, CBG 351 - 400: 9 units  >400 call MD   food thickener Liqd Commonly known as: SIMPLYTHICK (HONEY/LEVEL 3/MODERATELY THICK) Take 2 packets by mouth as needed to make extremely thick pudding   furosemide  20 MG tablet Commonly known as: Lasix  Take 2 tablets (40 mg total) by mouth daily as needed for worsening swelling or  weight gain   Glucagon  Emergency 1 MG Kit Inject 1 mg into  the skin as needed for up to 2 doses (Severe low blood sugar).   ipratropium-albuterol  0.5-2.5 (3) MG/3ML Soln Commonly known as: DUONEB Take 3 mLs by nebulization every 4 (four) hours as needed.   lactulose  10 GM/15ML solution Commonly known as: CHRONULAC  Take 15 mLs (10 g total) by mouth 2 (two) times daily as needed. Titrate until having 1 consistent bowel movement daily.  Can take up to 3 times daily.   Lancet Device Misc 1 each by Does not apply route 3 (three) times daily. May dispense any manufacturer covered by patient's insurance.   levothyroxine  25 MCG tablet Commonly known as: SYNTHROID  Take 25 mcg by mouth every morning.   lip balm ointment Apply 1 Application topically as needed (cold sores).   PreviDent 5000 Plus 1.1 % Crea dental cream Generic drug: sodium fluoride Place 1 Application onto teeth daily.   QUEtiapine  100 MG tablet Commonly known as: SEROQUEL  Take 100 mg by mouth at bedtime.   rOPINIRole 0.25 MG tablet Commonly known as: REQUIP Take 0.25 mg by mouth at bedtime.   rosuvastatin  10 MG tablet Commonly known as: CRESTOR  Take 10 mg by mouth daily.   scopolamine  1 MG/3DAYS Commonly known as: TRANSDERM-SCOP Place 1 patch (1.5 mg total) onto the skin every 3 (three) days.   senna 8.6 MG Tabs tablet Commonly known as: SENOKOT Take 1 tablet by mouth every morning.   tamsulosin  0.4 MG Caps capsule Commonly known as: FLOMAX  Take 1 capsule (0.4 mg total) by mouth daily after supper.   TechLite Pen Needles 32G X 6 MM Misc Generic drug: Insulin  Pen Needle Use 3 (three) times daily.   traMADol  HCl 100 MG Tabs Take 50 mg by mouth as needed.   zolpidem  10 MG tablet Commonly known as: AMBIEN  Take 10 mg by mouth at bedtime as needed. for sleep        Follow-up Information     [facility] Follow up.   Why: They will resume home health at discharge.                Allergies  Allergen Reactions   Tylenol  [Acetaminophen ]     Rash    Consultations: Treatment Team:  Selma Donnice SAUNDERS, MD   Procedures/Studies: US  Renal Result Date: 10/22/2023 CLINICAL DATA:  Renal failure, anuric EXAM: RENAL / URINARY TRACT ULTRASOUND COMPLETE COMPARISON:  None Available. FINDINGS: Right Kidney: Renal measurements: 10.4 x 8.2 x 6.3 cm volume: 280 mL. Echogenicity within normal limits. No mass or hydronephrosis visualized. Left Kidney: Renal measurements: 9.4 x 6.1 x 4.6 cm = volume: 137 mL. Echogenicity within normal limits. No mass or hydronephrosis visualized. Urinary bladder: Appears normal for degree of bladder distention. Other: Markedly limited evaluation due to patient body habitus. IMPRESSION: Grossly unremarkable renal ultrasound with limited evaluation. Electronically Signed   By: Morgane  Naveau M.D.   On: 10/22/2023 17:34   DG Chest 1 View Result Date: 10/22/2023 CLINICAL DATA:  02706 CHF (congestive heart failure) (HCC) [id number] EXAM: CHEST  1 VIEW COMPARISON:  Chest x-ray 03/29/2023 FINDINGS: The heart and mediastinal contours are unchanged. Atherosclerotic plaque. Low lung volumes. No focal consolidation. Slightly increased interstitial markings. No pleural effusion. No pneumothorax. No acute osseous abnormality. IMPRESSION: Low lung volumes with possible mild pulmonary edema. Consider repeat chest x-ray hand lateral view with improved inspiratory effort. Electronically Signed   By: Morgane  Naveau M.D.   On: 10/22/2023 17:01      Discharge Exam: Vitals:   10/25/23 0815 10/25/23 1145  BP: (!) 114/52 122/63  Pulse: 71 77  Resp: 16 16  Temp: 97.6 F (36.4 C) 98.3 F (36.8 C)  SpO2: 94% 96%   Vitals:   10/25/23 0040 10/25/23 0404 10/25/23 0815 10/25/23 1145  BP: (!) 106/48 122/66 (!) 114/52 122/63  Pulse: 67 75 71 77  Resp: 18 18 16 16   Temp: 98.1 F (36.7 C) 98.4 F (36.9 C) 97.6 F (36.4 C) 98.3 F (36.8 C)  TempSrc:      SpO2: 98% 100% 94% 96%   Weight:      Height:        Constitutional:  Normal appearance. Non toxic-appearing.  HENT: Head Normocephalic and atraumatic.  Mucous membranes are moist.  Eyes:  Extraocular intact. Conjunctivae normal.  Cardiovascular: Rate and Rhythm: Normal rate and regular rhythm.  Pulmonary: Non labored, symmetric rise of chest wall.  Skin: warm and dry. not jaundiced.  1+ pitting edema lower extremities Neurological: No focal deficit present. alert. Oriented. Psychiatric: Mood and Affect congruent.    The results of significant diagnostics from this hospitalization (including imaging, microbiology, ancillary and laboratory) are listed below for reference.     Microbiology: No results found for this or any previous visit (from the past 240 hours).   Labs: BNP (last 3 results) Recent Labs    01/01/23 1656 01/13/23 0529 10/22/23 1327  BNP 336.4* 193.9* 102.2*   Basic Metabolic Panel: Recent Labs  Lab 10/22/23 1327 10/23/23 0418 10/24/23 0408 10/25/23 0348  NA 136 139 140 143  K 5.4* 5.2* 4.2 4.2  CL 106 109 111 107  CO2 18* 18* 21* 22  GLUCOSE 194* 164* 166* 144*  BUN 106* 101* 98* 85*  CREATININE 3.97* 3.89* 3.54* 2.93*  CALCIUM  8.7* 8.7* 8.3* 8.9  MG  --   --   --  2.0  PHOS  --   --   --  4.8*   Liver Function Tests: Recent Labs  Lab 10/25/23 0348  AST 30  ALT 20  ALKPHOS 31*  BILITOT 0.5  PROT 6.3*  ALBUMIN 3.1*   No results for input(s): LIPASE, AMYLASE in the last 168 hours. No results for input(s): AMMONIA in the last 168 hours. CBC: Recent Labs  Lab 10/22/23 1327 10/23/23 0418 10/25/23 0348  WBC 9.2 7.0 6.3  NEUTROABS  --   --  4.5  HGB 10.7* 9.5* 9.2*  HCT 33.9* 30.2* 29.2*  MCV 99.1 98.1 97.0  PLT 175 147* 137*   Cardiac Enzymes: No results for input(s): CKTOTAL, CKMB, CKMBINDEX, TROPONINI in the last 168 hours. BNP: Invalid input(s): POCBNP CBG: Recent Labs  Lab 10/24/23 0700 10/24/23 1110 10/24/23 1522 10/25/23 0816  10/25/23 1148  GLUCAP 151* 196* 160* 180* 208*   D-Dimer No results for input(s): DDIMER in the last 72 hours. Hgb A1c Recent Labs    10/23/23 0418  HGBA1C 7.8*   Lipid Profile No results for input(s): CHOL, HDL, LDLCALC, TRIG, CHOLHDL, LDLDIRECT in the last 72 hours. Thyroid function studies No results for input(s): TSH, T4TOTAL, T3FREE, THYROIDAB in the last 72 hours.  Invalid input(s): FREET3 Anemia work up No results for input(s): VITAMINB12, FOLATE, FERRITIN, TIBC, IRON, RETICCTPCT in the last 72 hours. Urinalysis    Component Value Date/Time   COLORURINE STRAW (A) 10/23/2023 0200   APPEARANCEUR HAZY (A) 10/23/2023 0200   LABSPEC 1.009 10/23/2023 0200   PHURINE 5.0 10/23/2023 0200   GLUCOSEU NEGATIVE 10/23/2023 0200   HGBUR MODERATE (A) 10/23/2023 0200   BILIRUBINUR NEGATIVE 10/23/2023  0200   KETONESUR NEGATIVE 10/23/2023 0200   PROTEINUR NEGATIVE 10/23/2023 0200   NITRITE NEGATIVE 10/23/2023 0200   LEUKOCYTESUR SMALL (A) 10/23/2023 0200   Sepsis Labs Recent Labs  Lab 10/22/23 1327 10/23/23 0418 10/25/23 0348  WBC 9.2 7.0 6.3   Microbiology No results found for this or any previous visit (from the past 240 hours).   Time coordinating discharge: 32 min   SIGNED: Kuper Rennels, DO [facility] 10/25/2023, 3:40 PM Pager   If 7PM-7AM, please contact night-coverage

## 2023-11-18 DIAGNOSIS — R339 Retention of urine, unspecified: Secondary | ICD-10-CM | POA: Insufficient documentation

## 2023-11-18 NOTE — Progress Notes (Signed)
   11/24/2023 9:29 AM   Troy Mueller 26-Jun-1932 [id number]  Reason for visit: Follow up urinary retention   HPI: 89 year old male here for initial follow up with me re: urinary retention requiring indwelling Foley catheter Seen by Dr. Selma on 10/22/2023 and [facility] ED, acute urinary retention with penoscrotal edema, nurses unable to place catheter, urology consulted 14 French Foley placed, 500 mL output Complicated by heart failure exacerbation, AKI-nephrology consulted and recommended Lasix  drip, 2L output over 24 hours  Patient and home hospice care team present today.  Decision has been made to continue indwelling Foley while on hospice.  Patient brought in today to ensure safe catheter exchange considering difficulty of initial placement.  Patient is very pleasant, also voices desire to maintain catheter for comfort.  Hx of - CKD stage IIIb, chronic HF, pontine stroke on Eliquis , hypertension, obesity who is brought into the ED for worsening peripheral edema   Physical Exam: BP (!) 170/76   Pulse 60    Constitutional:  Alert and oriented, No acute distress.  GU: Wheelchair bound requiring [employer].  Indwelling Foley catheter in place with light yellow urine in tubing  Laboratory Data: N/A  Pertinent Imaging: N/A   Assessment & Plan:    Urinary retention Assessment & Plan: Acute urinary retention Foley placed 10/22/2023 On home hospice  Patient and caregiver team present today.  I agree with with plan as patient finds catheter tolerable and an improvement in quality of life.  Continue indwelling catheter with Q 6-8-week exchanges, or as clinically necessary.    Patient's home hospice care team performed catheter exchange today.  I was present and presided over the procedure to answer questions and ensure safe passage.  Care team easily placed a new 14 French straight catheter.  Prior penoscrotal edema from heart failure exacerbation had resolved making catheter placement  much easier.  - Maintain catheter with every 6-8-week exchanges, home hospice/comfort - Urology follow-up as needed, please do not hesitate to reach out if new concerns         Penne JONELLE Skye, MD  [facility] [address], [address] [place], [place] [address] [phone]

## 2023-11-18 NOTE — Assessment & Plan Note (Addendum)
 Acute urinary retention Foley placed 10/22/2023 On home hospice  Patient and caregiver team present today.  I agree with with plan as patient finds catheter tolerable and an improvement in quality of life.  Continue indwelling catheter with Q 6-8-week exchanges, or as clinically necessary.    Patient's home hospice care team performed catheter exchange today.  I was present and presided over the procedure to answer questions and ensure safe passage.  Care team easily placed a new 14 French straight catheter.  Prior penoscrotal edema from heart failure exacerbation had resolved making catheter placement much easier.  - Maintain catheter with every 6-8-week exchanges, home hospice/comfort - Urology follow-up as needed, please do not hesitate to reach out if new concerns

## 2023-11-24 ENCOUNTER — Ambulatory Visit (INDEPENDENT_AMBULATORY_CARE_PROVIDER_SITE_OTHER): Payer: PRIVATE HEALTH INSURANCE | Admitting: Urology

## 2023-11-24 VITALS — BP 170/76 | HR 60

## 2023-11-24 DIAGNOSIS — R339 Retention of urine, unspecified: Secondary | ICD-10-CM
